# Patient Record
Sex: Male | Born: 1965
Health system: Southern US, Community
[De-identification: ages and names within clinical notes are randomized; demographics above are authoritative.]

## PROBLEM LIST (undated history)

## (undated) DIAGNOSIS — I4891 Unspecified atrial fibrillation: Secondary | ICD-10-CM

## (undated) DIAGNOSIS — I2510 Atherosclerotic heart disease of native coronary artery without angina pectoris: Secondary | ICD-10-CM

## (undated) DIAGNOSIS — I219 Acute myocardial infarction, unspecified: Secondary | ICD-10-CM

## (undated) DIAGNOSIS — K219 Gastro-esophageal reflux disease without esophagitis: Secondary | ICD-10-CM

## (undated) DIAGNOSIS — K449 Diaphragmatic hernia without obstruction or gangrene: Secondary | ICD-10-CM

## (undated) DIAGNOSIS — M5137 Other intervertebral disc degeneration, lumbosacral region: Secondary | ICD-10-CM

## (undated) DIAGNOSIS — G4710 Hypersomnia, unspecified: Secondary | ICD-10-CM

## (undated) DIAGNOSIS — I10 Essential (primary) hypertension: Secondary | ICD-10-CM

## (undated) DIAGNOSIS — J984 Other disorders of lung: Secondary | ICD-10-CM

## (undated) DIAGNOSIS — E785 Hyperlipidemia, unspecified: Secondary | ICD-10-CM

## (undated) DIAGNOSIS — Z8669 Personal history of other diseases of the nervous system and sense organs: Secondary | ICD-10-CM

## (undated) DIAGNOSIS — T82190A Other mechanical complication of cardiac electrode, initial encounter: Secondary | ICD-10-CM

## (undated) DIAGNOSIS — R55 Syncope and collapse: Secondary | ICD-10-CM

## (undated) DIAGNOSIS — I495 Sick sinus syndrome: Secondary | ICD-10-CM

## (undated) DIAGNOSIS — G4730 Sleep apnea, unspecified: Secondary | ICD-10-CM

## (undated) DIAGNOSIS — R7302 Impaired glucose tolerance (oral): Secondary | ICD-10-CM

## (undated) DIAGNOSIS — Z950 Presence of cardiac pacemaker: Secondary | ICD-10-CM

## (undated) DIAGNOSIS — R6882 Decreased libido: Secondary | ICD-10-CM

## (undated) DIAGNOSIS — M51379 Other intervertebral disc degeneration, lumbosacral region without mention of lumbar back pain or lower extremity pain: Secondary | ICD-10-CM

## (undated) DIAGNOSIS — M519 Unspecified thoracic, thoracolumbar and lumbosacral intervertebral disc disorder: Secondary | ICD-10-CM

## (undated) HISTORY — DX: Sick sinus syndrome: I49.5

## (undated) HISTORY — DX: Presence of cardiac pacemaker: Z95.0

## (undated) HISTORY — DX: Personal history of other diseases of the nervous system and sense organs: Z86.69

## (undated) HISTORY — DX: Other mechanical complication of cardiac electrode, initial encounter: T82.190A

## (undated) HISTORY — DX: Other disorders of lung: J98.4

## (undated) HISTORY — DX: Essential (primary) hypertension: I10

## (undated) HISTORY — DX: Atherosclerotic heart disease of native coronary artery without angina pectoris: I25.10

## (undated) HISTORY — DX: Hypersomnia, unspecified: G47.10

## (undated) HISTORY — DX: Sleep apnea, unspecified: G47.30

## (undated) HISTORY — DX: Unspecified atrial fibrillation: I48.91

## (undated) HISTORY — DX: Diaphragmatic hernia without obstruction or gangrene: K44.9

## (undated) HISTORY — DX: Syncope and collapse: R55

## (undated) HISTORY — DX: Unspecified thoracic, thoracolumbar and lumbosacral intervertebral disc disorder: M51.9

## (undated) HISTORY — DX: Gastro-esophageal reflux disease without esophagitis: K21.9

## (undated) HISTORY — DX: Impaired glucose tolerance (oral): R73.02

## (undated) HISTORY — DX: Other intervertebral disc degeneration, lumbosacral region: M51.37

## (undated) HISTORY — PX: BACK SURGERY: SHX140

## (undated) HISTORY — DX: Hyperlipidemia, unspecified: E78.5

## (undated) HISTORY — PX: LUMBAR LAMINECTOMY: SHX95

## (undated) HISTORY — DX: Decreased libido: R68.82

## (undated) HISTORY — DX: Other intervertebral disc degeneration, lumbosacral region without mention of lumbar back pain or lower extremity pain: M51.379

---

## 2006-12-14 HISTORY — PX: CARDIAC PACEMAKER PLACEMENT: SHX583

## 2007-08-23 ENCOUNTER — Ambulatory Visit: Payer: Self-pay | Admitting: Cardiology

## 2007-08-23 ENCOUNTER — Ambulatory Visit: Payer: Self-pay | Admitting: Internal Medicine

## 2007-08-23 ENCOUNTER — Inpatient Hospital Stay (HOSPITAL_COMMUNITY): Admission: EM | Admit: 2007-08-23 | Discharge: 2007-08-24 | Payer: Self-pay | Admitting: Emergency Medicine

## 2007-08-26 ENCOUNTER — Ambulatory Visit: Payer: Self-pay | Admitting: Cardiology

## 2007-08-30 ENCOUNTER — Ambulatory Visit: Payer: Self-pay | Admitting: Cardiology

## 2007-08-30 ENCOUNTER — Ambulatory Visit: Payer: Self-pay

## 2007-09-07 ENCOUNTER — Ambulatory Visit: Payer: Self-pay

## 2007-09-07 ENCOUNTER — Ambulatory Visit: Payer: Self-pay | Admitting: Cardiology

## 2007-09-07 ENCOUNTER — Ambulatory Visit: Payer: Self-pay | Admitting: Internal Medicine

## 2007-10-12 ENCOUNTER — Encounter: Payer: Self-pay | Admitting: Internal Medicine

## 2007-10-12 ENCOUNTER — Ambulatory Visit: Payer: Self-pay | Admitting: Internal Medicine

## 2007-10-12 DIAGNOSIS — M549 Dorsalgia, unspecified: Secondary | ICD-10-CM | POA: Insufficient documentation

## 2007-10-12 DIAGNOSIS — F32A Depression, unspecified: Secondary | ICD-10-CM | POA: Insufficient documentation

## 2007-10-12 DIAGNOSIS — Z862 Personal history of diseases of the blood and blood-forming organs and certain disorders involving the immune mechanism: Secondary | ICD-10-CM | POA: Insufficient documentation

## 2007-10-12 DIAGNOSIS — M545 Low back pain, unspecified: Secondary | ICD-10-CM | POA: Insufficient documentation

## 2007-10-12 DIAGNOSIS — I10 Essential (primary) hypertension: Secondary | ICD-10-CM | POA: Insufficient documentation

## 2007-10-12 DIAGNOSIS — M5137 Other intervertebral disc degeneration, lumbosacral region: Secondary | ICD-10-CM | POA: Insufficient documentation

## 2007-10-12 DIAGNOSIS — Z8639 Personal history of other endocrine, nutritional and metabolic disease: Secondary | ICD-10-CM

## 2007-10-12 DIAGNOSIS — F329 Major depressive disorder, single episode, unspecified: Secondary | ICD-10-CM | POA: Insufficient documentation

## 2007-10-12 DIAGNOSIS — I4891 Unspecified atrial fibrillation: Secondary | ICD-10-CM | POA: Insufficient documentation

## 2007-10-12 DIAGNOSIS — K219 Gastro-esophageal reflux disease without esophagitis: Secondary | ICD-10-CM | POA: Insufficient documentation

## 2007-10-12 DIAGNOSIS — E785 Hyperlipidemia, unspecified: Secondary | ICD-10-CM | POA: Insufficient documentation

## 2007-11-17 ENCOUNTER — Ambulatory Visit: Payer: Self-pay | Admitting: Internal Medicine

## 2007-11-17 LAB — CONVERTED CEMR LAB
ALT: 40 units/L (ref 0–53)
AST: 27 units/L (ref 0–37)
Albumin: 3.9 g/dL (ref 3.5–5.2)
Alkaline Phosphatase: 44 units/L (ref 39–117)
Basophils Absolute: 0 10*3/uL (ref 0.0–0.1)
Calcium: 9.2 mg/dL (ref 8.4–10.5)
Chloride: 109 meq/L (ref 96–112)
Cholesterol: 191 mg/dL (ref 0–200)
Eosinophils Absolute: 0.3 10*3/uL (ref 0.0–0.6)
Eosinophils Relative: 6.9 % — ABNORMAL HIGH (ref 0.0–5.0)
GFR calc non Af Amer: 70 mL/min
Ketones, ur: NEGATIVE mg/dL
LDL Cholesterol: 145 mg/dL — ABNORMAL HIGH (ref 0–99)
MCV: 92.3 fL (ref 78.0–100.0)
Nitrite: NEGATIVE
Platelets: 210 10*3/uL (ref 150–400)
RBC: 4.96 M/uL (ref 4.22–5.81)
Total CHOL/HDL Ratio: 7.4
Total Protein, Urine: NEGATIVE mg/dL
Urine Glucose: NEGATIVE mg/dL
VLDL: 20 mg/dL (ref 0–40)
WBC: 4.3 10*3/uL — ABNORMAL LOW (ref 4.5–10.5)

## 2007-11-30 ENCOUNTER — Ambulatory Visit: Payer: Self-pay | Admitting: Internal Medicine

## 2008-01-02 ENCOUNTER — Ambulatory Visit: Payer: Self-pay | Admitting: Internal Medicine

## 2008-05-03 ENCOUNTER — Encounter: Payer: Self-pay | Admitting: Internal Medicine

## 2008-05-03 DIAGNOSIS — J984 Other disorders of lung: Secondary | ICD-10-CM | POA: Insufficient documentation

## 2008-05-03 HISTORY — DX: Other disorders of lung: J98.4

## 2008-05-04 ENCOUNTER — Encounter: Payer: Self-pay | Admitting: Internal Medicine

## 2008-05-11 ENCOUNTER — Ambulatory Visit: Payer: Self-pay | Admitting: Internal Medicine

## 2008-05-11 LAB — CONVERTED CEMR LAB: Creatinine, Ser: 1.1 mg/dL (ref 0.4–1.5)

## 2008-05-21 ENCOUNTER — Ambulatory Visit: Payer: Self-pay | Admitting: Internal Medicine

## 2008-05-23 ENCOUNTER — Ambulatory Visit: Payer: Self-pay | Admitting: Cardiology

## 2008-05-29 ENCOUNTER — Telehealth: Payer: Self-pay | Admitting: Internal Medicine

## 2008-06-07 ENCOUNTER — Emergency Department (HOSPITAL_COMMUNITY): Admission: EM | Admit: 2008-06-07 | Discharge: 2008-06-07 | Payer: Self-pay | Admitting: Emergency Medicine

## 2008-06-07 ENCOUNTER — Ambulatory Visit: Payer: Self-pay | Admitting: Cardiology

## 2008-07-10 ENCOUNTER — Ambulatory Visit: Payer: Self-pay | Admitting: Internal Medicine

## 2008-11-26 ENCOUNTER — Ambulatory Visit: Payer: Self-pay | Admitting: Internal Medicine

## 2008-11-27 LAB — CONVERTED CEMR LAB
ALT: 28 units/L (ref 0–53)
AST: 23 units/L (ref 0–37)
Albumin: 3.9 g/dL (ref 3.5–5.2)
Alkaline Phosphatase: 47 units/L (ref 39–117)
BUN: 18 mg/dL (ref 6–23)
Basophils Absolute: 0 10*3/uL (ref 0.0–0.1)
Basophils Relative: 0.9 % (ref 0.0–3.0)
Bilirubin Urine: NEGATIVE
Bilirubin, Direct: 0.1 mg/dL (ref 0.0–0.3)
CO2: 28 meq/L (ref 19–32)
Calcium: 9.4 mg/dL (ref 8.4–10.5)
Chloride: 109 meq/L (ref 96–112)
Cholesterol: 197 mg/dL (ref 0–200)
Creatinine, Ser: 1.1 mg/dL (ref 0.4–1.5)
Eosinophils Absolute: 0.4 10*3/uL (ref 0.0–0.7)
Eosinophils Relative: 7.1 % — ABNORMAL HIGH (ref 0.0–5.0)
GFR calc Af Amer: 94 mL/min
GFR calc non Af Amer: 77 mL/min
Glucose, Bld: 118 mg/dL — ABNORMAL HIGH (ref 70–99)
HCT: 47.1 % (ref 39.0–52.0)
HDL: 30.8 mg/dL — ABNORMAL LOW (ref >39.0)
Hemoglobin, Urine: NEGATIVE
Hemoglobin: 16.7 g/dL (ref 13.0–17.0)
Hgb A1c MFr Bld: 5 % (ref 4.6–6.0)
Ketones, ur: NEGATIVE mg/dL
LDL Cholesterol: 150 mg/dL — ABNORMAL HIGH (ref 0–99)
Leukocytes, UA: NEGATIVE
Lymphocytes Relative: 27.1 % (ref 12.0–46.0)
MCHC: 35.3 g/dL (ref 30.0–36.0)
MCV: 93.4 fL (ref 78.0–100.0)
Monocytes Absolute: 0.3 10*3/uL (ref 0.1–1.0)
Monocytes Relative: 5.9 % (ref 3.0–12.0)
Neutro Abs: 3 10*3/uL (ref 1.4–7.7)
Neutrophils Relative %: 59 % (ref 43.0–77.0)
Nitrite: NEGATIVE
PSA: 0.77 ng/mL (ref 0.10–4.00)
Platelets: 217 10*3/uL (ref 150–400)
Potassium: 4.6 meq/L (ref 3.5–5.1)
RBC: 5.04 M/uL (ref 4.22–5.81)
RDW: 11.6 % (ref 11.5–14.6)
Sodium: 142 meq/L (ref 135–145)
Specific Gravity, Urine: 1.025 (ref 1.000–1.03)
TSH: 0.7 microintl units/mL (ref 0.35–5.50)
Total Bilirubin: 0.9 mg/dL (ref 0.3–1.2)
Total CHOL/HDL Ratio: 6.4
Total Protein, Urine: NEGATIVE mg/dL
Total Protein: 6.8 g/dL (ref 6.0–8.3)
Triglycerides: 83 mg/dL (ref 0–149)
Urine Glucose: NEGATIVE mg/dL
Urobilinogen, UA: 0.2 (ref 0.0–1.0)
VLDL: 17 mg/dL (ref 0–40)
WBC: 5.1 10*3/uL (ref 4.5–10.5)
pH: 5.5 (ref 5.0–8.0)

## 2008-11-30 ENCOUNTER — Ambulatory Visit: Payer: Self-pay | Admitting: Internal Medicine

## 2008-11-30 DIAGNOSIS — G510 Bell's palsy: Secondary | ICD-10-CM | POA: Insufficient documentation

## 2008-12-14 DIAGNOSIS — I219 Acute myocardial infarction, unspecified: Secondary | ICD-10-CM

## 2008-12-14 HISTORY — PX: CORONARY ANGIOPLASTY WITH STENT PLACEMENT: SHX49

## 2008-12-14 HISTORY — DX: Acute myocardial infarction, unspecified: I21.9

## 2008-12-26 ENCOUNTER — Ambulatory Visit: Payer: Self-pay | Admitting: Internal Medicine

## 2009-01-10 ENCOUNTER — Ambulatory Visit: Payer: Self-pay | Admitting: Internal Medicine

## 2009-01-10 DIAGNOSIS — J069 Acute upper respiratory infection, unspecified: Secondary | ICD-10-CM | POA: Insufficient documentation

## 2009-03-29 ENCOUNTER — Encounter (INDEPENDENT_AMBULATORY_CARE_PROVIDER_SITE_OTHER): Payer: Self-pay

## 2009-05-22 ENCOUNTER — Ambulatory Visit: Payer: Self-pay

## 2009-05-22 ENCOUNTER — Encounter: Payer: Self-pay | Admitting: Internal Medicine

## 2009-08-25 ENCOUNTER — Ambulatory Visit: Payer: Self-pay | Admitting: Cardiovascular Disease

## 2009-08-25 ENCOUNTER — Encounter: Payer: Self-pay | Admitting: Cardiovascular Disease

## 2009-08-25 ENCOUNTER — Inpatient Hospital Stay (HOSPITAL_COMMUNITY): Admission: EM | Admit: 2009-08-25 | Discharge: 2009-08-27 | Payer: Self-pay | Admitting: Emergency Medicine

## 2009-09-11 ENCOUNTER — Encounter: Payer: Self-pay | Admitting: Physician Assistant

## 2009-09-11 ENCOUNTER — Ambulatory Visit: Payer: Self-pay | Admitting: Cardiovascular Disease

## 2009-09-11 ENCOUNTER — Encounter (INDEPENDENT_AMBULATORY_CARE_PROVIDER_SITE_OTHER): Payer: Self-pay | Admitting: *Deleted

## 2009-09-11 DIAGNOSIS — I2510 Atherosclerotic heart disease of native coronary artery without angina pectoris: Secondary | ICD-10-CM

## 2009-09-11 DIAGNOSIS — R079 Chest pain, unspecified: Secondary | ICD-10-CM | POA: Insufficient documentation

## 2009-09-11 HISTORY — DX: Atherosclerotic heart disease of native coronary artery without angina pectoris: I25.10

## 2009-10-14 ENCOUNTER — Telehealth (INDEPENDENT_AMBULATORY_CARE_PROVIDER_SITE_OTHER): Payer: Self-pay | Admitting: *Deleted

## 2009-10-15 ENCOUNTER — Telehealth: Payer: Self-pay | Admitting: Internal Medicine

## 2009-10-21 ENCOUNTER — Ambulatory Visit: Payer: Self-pay | Admitting: Internal Medicine

## 2009-10-21 LAB — CONVERTED CEMR LAB
Alkaline Phosphatase: 42 units/L (ref 39–117)
Basophils Relative: 0.9 % (ref 0.0–3.0)
Bilirubin Urine: NEGATIVE
Bilirubin, Direct: 0.2 mg/dL (ref 0.0–0.3)
CO2: 28 meq/L (ref 19–32)
Calcium: 9 mg/dL (ref 8.4–10.5)
Cholesterol: 99 mg/dL (ref 0–200)
Creatinine, Ser: 0.9 mg/dL (ref 0.4–1.5)
Eosinophils Absolute: 0.2 10*3/uL (ref 0.0–0.7)
Eosinophils Relative: 4.8 % (ref 0.0–5.0)
GFR calc non Af Amer: 96.85 mL/min (ref >60)
LDL Cholesterol: 58 mg/dL (ref 0–99)
Lymphocytes Relative: 28 % (ref 12.0–46.0)
MCHC: 35.7 g/dL (ref 30.0–36.0)
Monocytes Relative: 6.2 % (ref 3.0–12.0)
Neutrophils Relative %: 60.1 % (ref 43.0–77.0)
Nitrite: NEGATIVE
RBC: 4.77 M/uL (ref 4.22–5.81)
Sodium: 142 meq/L (ref 135–145)
Total CHOL/HDL Ratio: 4
Total Protein, Urine: NEGATIVE mg/dL
Total Protein: 7 g/dL (ref 6.0–8.3)
VLDL: 13.2 mg/dL (ref 0.0–40.0)
WBC: 4.3 10*3/uL — ABNORMAL LOW (ref 4.5–10.5)
pH: 5 (ref 5.0–8.0)

## 2009-10-25 IMAGING — CT CT CHEST W/ CM
2 of 4 series · 15 of 36 positions shown, 18 images · IV contrast (Omnipaque 300)
Comparison: 08/26/2007

CLINICAL DATA: Pulmonary nodules

CT CHEST WITH CONTRAST
TECHNIQUE: Multidetector CT imaging of the chest was performed
following the standard protocol during bolus administration of
intravenous contrast.
Contrast: 80 ml Hmnipaque-F99

[Series 2: chest_routine 5.0 b40f st · axial · 0.81mm/px · z∈[-350,-70]mm · 12 of 68 slices shown, 15 images]
[im 6/68  mediastinal]
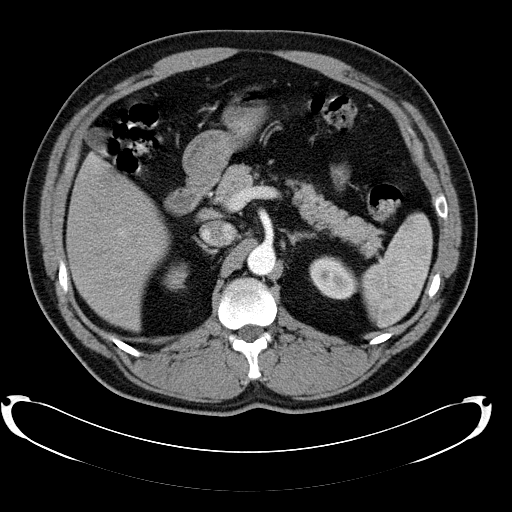
[im 6/68  lung]
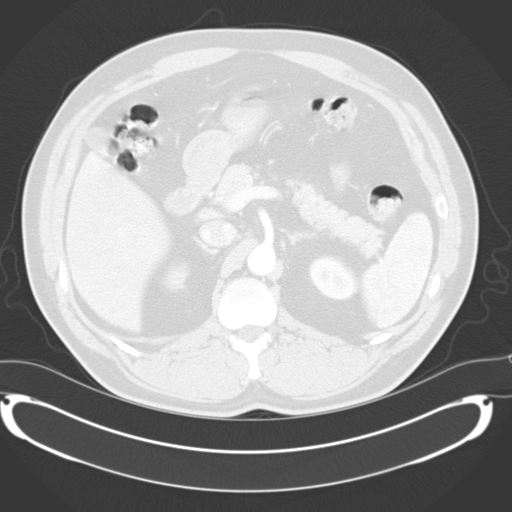
[im 11/68  lung]
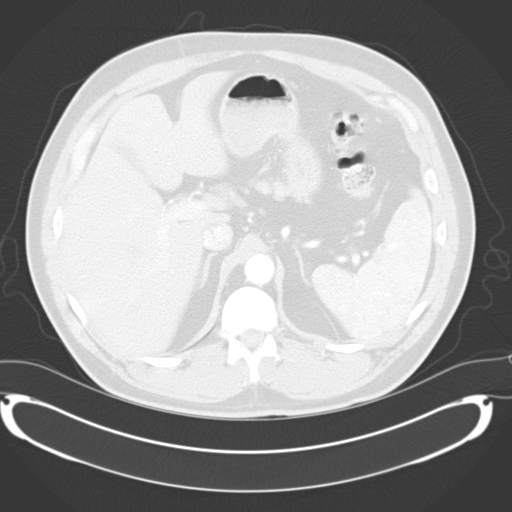
[im 16/68  lung]
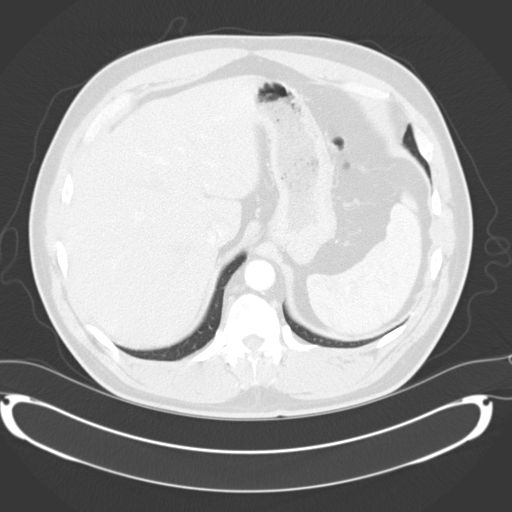
[im 21/68  lung]
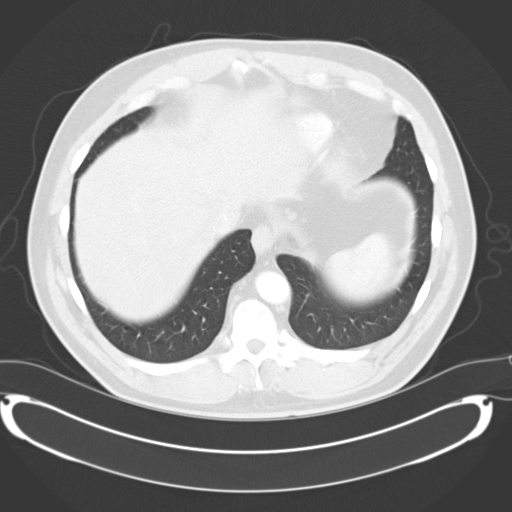
[im 26/68  mediastinal]
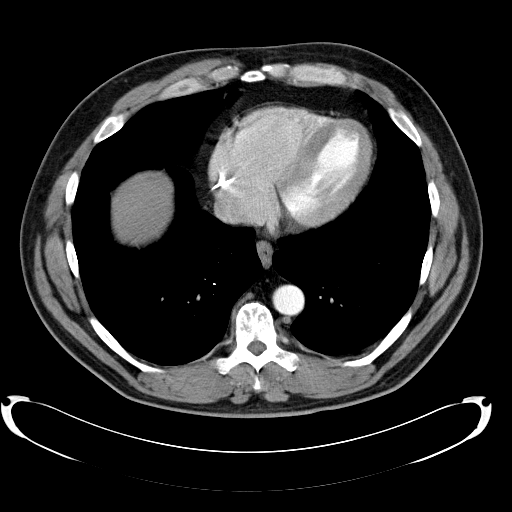
[im 26/68  lung]
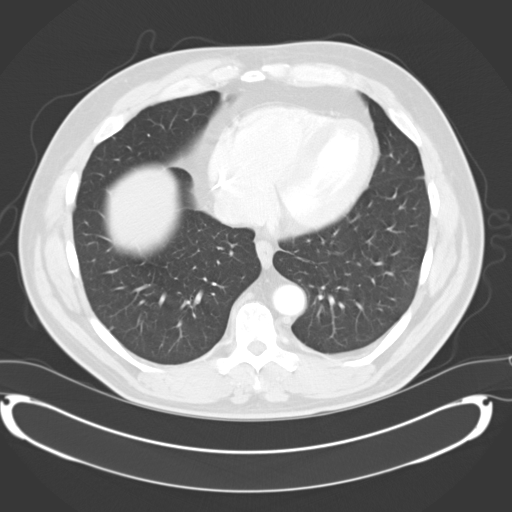
[im 31/68  lung]
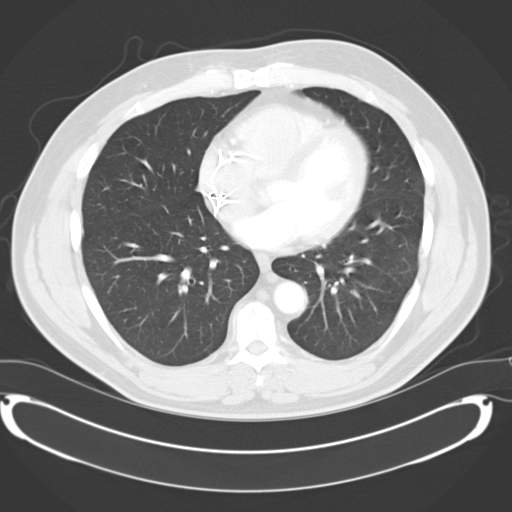
[im 37/68  lung]
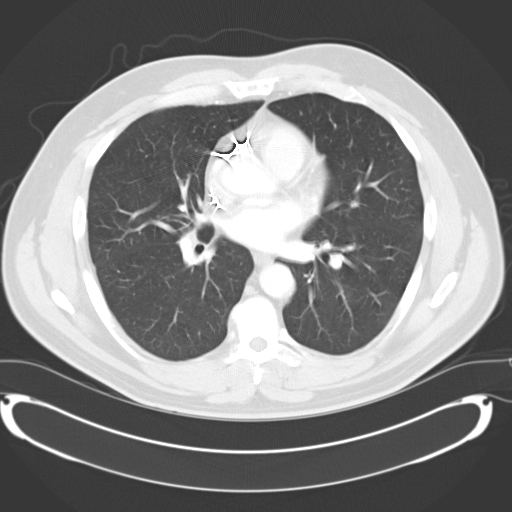
[im 42/68  lung]
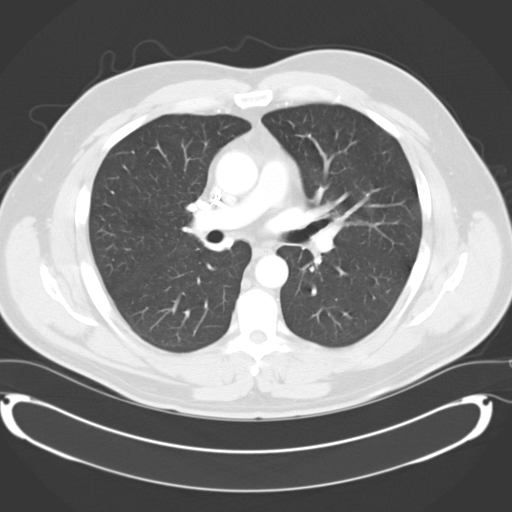
[im 47/68  mediastinal]
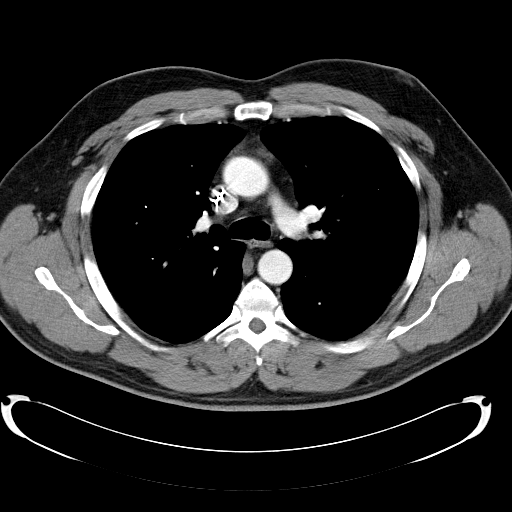
[im 47/68  lung]
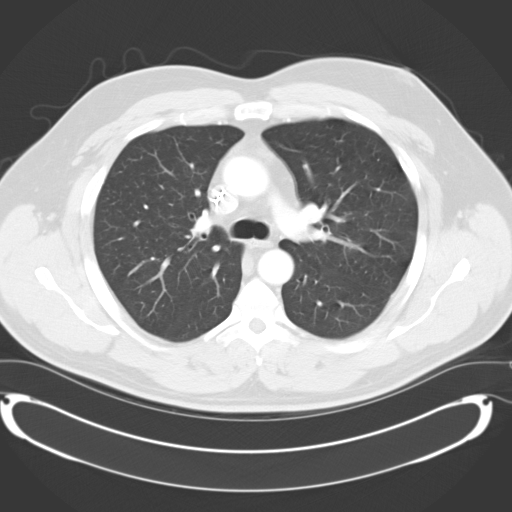
[im 52/68  lung]
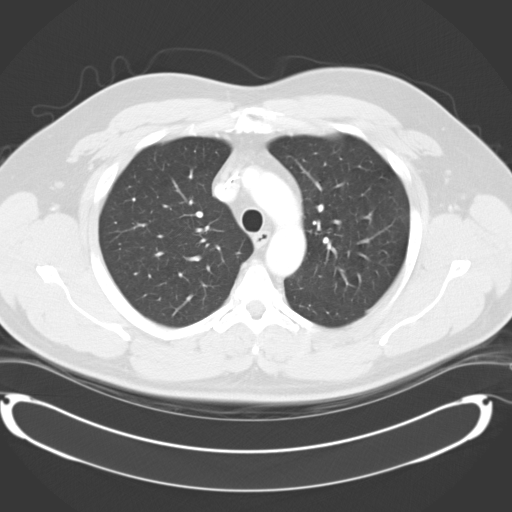
[im 57/68  lung]
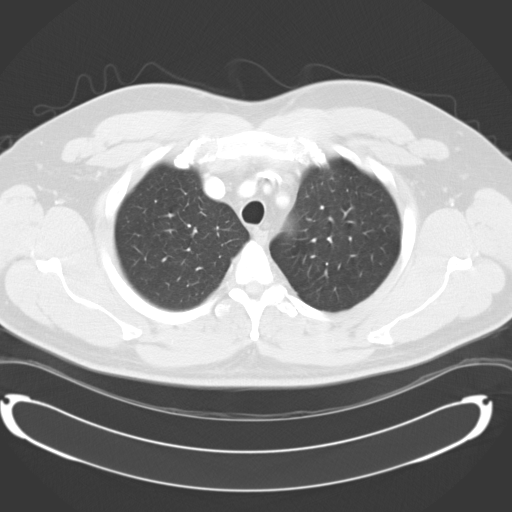
[im 62/68  lung]
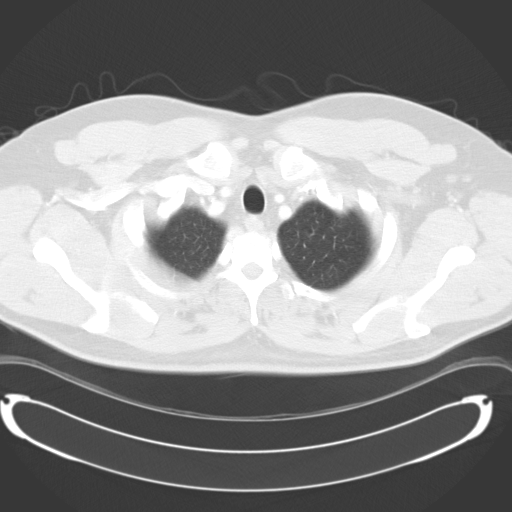

[Series 602: <mpr range> · coronal · 0.81mm/px · 3 of 145 slices shown]
[im 29/145  lung]
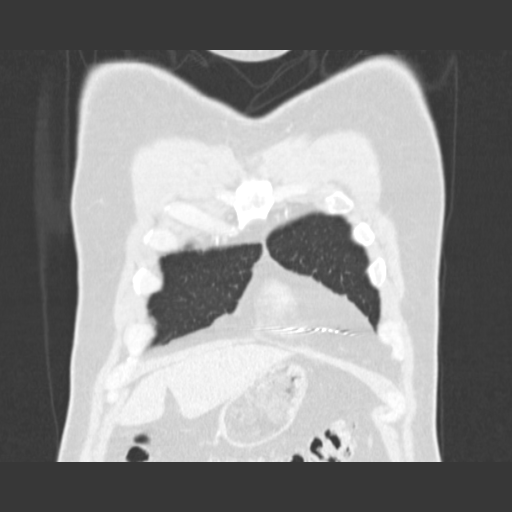
[im 58/145  lung]
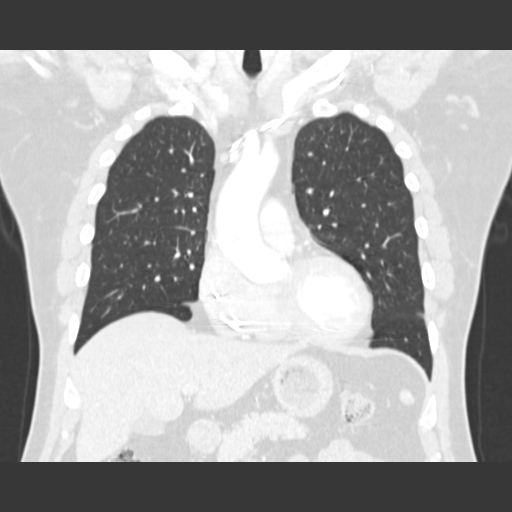
[im 87/145  lung]
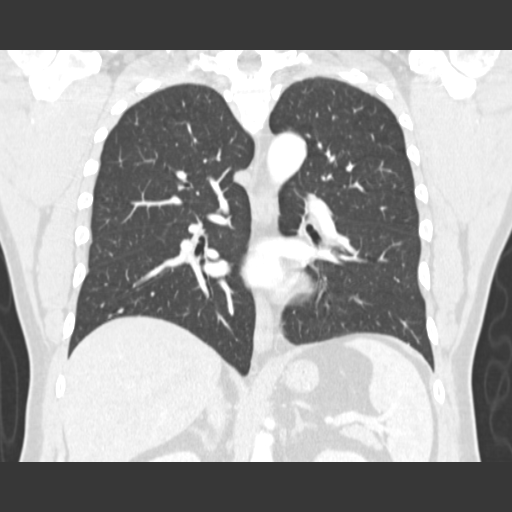

[15 of 36 positions shown; findings below may reference images not displayed]

FINDINGS: The chest wall is unremarkable.  No supraclavicular or
axillary adenopathy.  No significant bony findings.

The heart is normal in size.  No pericardial effusion.  No
mediastinal or hilar adenopathy.  The aorta is normal in caliber.
No dissection.  The esophagus is unremarkable.  There is a small
para esophageal lymph node which is unchanged.

Examination of the lung parenchyma demonstrates stable mild changes
of COPD.  No interval change and small pulmonary nodules.  These
have rather flat margins and are most likely intrapulmonary lymph
nodes.  The lung bases are clear of acute process.  No effusions.

The upper abdomen is unremarkable.
IMPRESSION: 1.  No acute pulmonary findings.  No mediastinal or hilar
adenopathy.
2.  Stable small pulmonary nodules, likely benign intrapulmonary
lymph nodes given their stability over time and morphology.

## 2009-11-15 ENCOUNTER — Encounter: Payer: Self-pay | Admitting: Internal Medicine

## 2009-11-19 ENCOUNTER — Ambulatory Visit: Payer: Self-pay | Admitting: Internal Medicine

## 2009-11-19 DIAGNOSIS — G4710 Hypersomnia, unspecified: Secondary | ICD-10-CM | POA: Insufficient documentation

## 2009-11-19 DIAGNOSIS — G4730 Sleep apnea, unspecified: Secondary | ICD-10-CM

## 2009-11-19 DIAGNOSIS — I495 Sick sinus syndrome: Secondary | ICD-10-CM | POA: Insufficient documentation

## 2009-12-02 ENCOUNTER — Ambulatory Visit: Payer: Self-pay | Admitting: Internal Medicine

## 2009-12-02 DIAGNOSIS — R6882 Decreased libido: Secondary | ICD-10-CM | POA: Insufficient documentation

## 2010-04-24 ENCOUNTER — Telehealth: Payer: Self-pay | Admitting: Internal Medicine

## 2010-04-24 ENCOUNTER — Telehealth: Payer: Self-pay | Admitting: Cardiovascular Disease

## 2010-05-29 ENCOUNTER — Ambulatory Visit: Payer: Self-pay | Admitting: Internal Medicine

## 2010-05-30 ENCOUNTER — Encounter: Payer: Self-pay | Admitting: Internal Medicine

## 2010-06-06 ENCOUNTER — Ambulatory Visit: Payer: Self-pay | Admitting: Internal Medicine

## 2010-06-17 ENCOUNTER — Telehealth: Payer: Self-pay | Admitting: Internal Medicine

## 2010-06-17 LAB — CONVERTED CEMR LAB
ALT: 27 units/L (ref 0–53)
AST: 29 units/L (ref 0–37)
Alkaline Phosphatase: 33 units/L — ABNORMAL LOW (ref 39–117)
Bilirubin, Direct: 0.2 mg/dL (ref 0.0–0.3)
HDL: 31 mg/dL — ABNORMAL LOW (ref >39.00)
Total Bilirubin: 1 mg/dL (ref 0.3–1.2)

## 2010-06-20 ENCOUNTER — Ambulatory Visit: Payer: Self-pay

## 2010-06-20 ENCOUNTER — Telehealth: Payer: Self-pay | Admitting: Internal Medicine

## 2010-08-27 ENCOUNTER — Telehealth: Payer: Self-pay | Admitting: Internal Medicine

## 2010-09-25 ENCOUNTER — Telehealth (INDEPENDENT_AMBULATORY_CARE_PROVIDER_SITE_OTHER): Payer: Self-pay | Admitting: *Deleted

## 2010-11-19 ENCOUNTER — Ambulatory Visit: Payer: Self-pay

## 2010-12-02 ENCOUNTER — Ambulatory Visit: Payer: Self-pay | Admitting: Internal Medicine

## 2010-12-02 LAB — CONVERTED CEMR LAB
ALT: 29 units/L (ref 0–53)
AST: 29 units/L (ref 0–37)
Basophils Relative: 0.4 % (ref 0.0–3.0)
Bilirubin Urine: NEGATIVE
Bilirubin, Direct: 0.2 mg/dL (ref 0.0–0.3)
Chloride: 107 meq/L (ref 96–112)
Eosinophils Relative: 4.5 % (ref 0.0–5.0)
HCT: 43 % (ref 39.0–52.0)
Ketones, ur: NEGATIVE mg/dL
LDL Cholesterol: 60 mg/dL (ref 0–99)
Leukocytes, UA: NEGATIVE
Lymphs Abs: 1.3 10*3/uL (ref 0.7–4.0)
MCV: 93.9 fL (ref 78.0–100.0)
Monocytes Absolute: 0.4 10*3/uL (ref 0.1–1.0)
Monocytes Relative: 9.2 % (ref 3.0–12.0)
Neutrophils Relative %: 53.3 % (ref 43.0–77.0)
PSA: 0.58 ng/mL (ref 0.10–4.00)
Potassium: 4.3 meq/L (ref 3.5–5.1)
RBC: 4.57 M/uL (ref 4.22–5.81)
Sodium: 141 meq/L (ref 135–145)
Total CHOL/HDL Ratio: 4
Total Protein: 6.5 g/dL (ref 6.0–8.3)
Urine Glucose: NEGATIVE mg/dL
WBC: 4.1 10*3/uL — ABNORMAL LOW (ref 4.5–10.5)
pH: 7 (ref 5.0–8.0)

## 2010-12-09 ENCOUNTER — Ambulatory Visit: Payer: Self-pay | Admitting: Internal Medicine

## 2010-12-09 ENCOUNTER — Encounter: Payer: Self-pay | Admitting: Internal Medicine

## 2010-12-09 DIAGNOSIS — R109 Unspecified abdominal pain: Secondary | ICD-10-CM | POA: Insufficient documentation

## 2010-12-14 DIAGNOSIS — Z950 Presence of cardiac pacemaker: Secondary | ICD-10-CM

## 2010-12-14 HISTORY — DX: Presence of cardiac pacemaker: Z95.0

## 2011-01-11 LAB — CONVERTED CEMR LAB
Testosterone Free: 40 pg/mL — ABNORMAL LOW (ref 47.0–244.0)
Testosterone-% Free: 1.7 % (ref 1.6–2.9)
Testosterone: 234.44 ng/dL — ABNORMAL LOW (ref 350–890)

## 2011-01-13 NOTE — Cardiovascular Report (Signed)
Summary: Office Visit   Office Visit   Imported By: Roderic Ovens 05/30/2010 12:44:31  _____________________________________________________________________  External Attachment:    Type:   Image     Comment:   External Document

## 2011-01-13 NOTE — Assessment & Plan Note (Signed)
Summary: appt @ 2:00/pc2/jml      Allergies Added:   Primary Provider:  Dr. Jonny Ruiz   History of Present Illness:   [name] is seen in followup for syncope associated with bradycardia and sinus node dysfunction his paroxysmal atrial fibrillation. He is status post pacemaker implantation.  He also has a history of coronary disease status post drug-eluting stenting to his right coronary artery with near normal left ventricular function. T   he patient denies SOB, chest pain, edema or palpitations His major complaint is fatigue but he has lost 25 lbs(at least) and is exercising more than hour a day.   Current Medications (verified): 1)  Cozaar 25 Mg Tabs (Losartan Potassium) .Marland Kitchen.. 1 By Mouth Once Daily 2)  Ecotrin 325 Mg Tbec (Aspirin) .Marland Kitchen.. 1 By Mouth Once Daily 3)  Plavix 75 Mg Tabs (Clopidogrel Bisulfate) .... Take One Daily 4)  Crestor 40 Mg Tabs (Rosuvastatin Calcium) .... Take One Daily 5)  Metoprolol Tartrate 25 Mg Tabs (Metoprolol Tartrate) .... Take One Tablet Two Times A Day 6)  Nitrostat 0.4 Mg Subl (Nitroglycerin) .... Take One As Needed  Allergies (verified): 1)  ! Lisinopril  Past History:  Past Medical History: Last updated: 11/15/2009 Hyperlipidemia Hypertension GERD Depression Lumbar disc dz Glucose intolerance Atrial fibrillation Pacemaker-St. Jude Zephyr 5826/2008 syncope/asystole episode Hiatal Hernia Low back pain hx Bell's Palsy  Past Surgical History: Last updated: 11/15/2009 s/p pacemaker-St. Jude Zephyr [id number] Lumbar laminectomy  Family History: Last updated: 10/12/2007 Family History of CAD Male 1st degree relative <60 Family History Diabetes 1st degree relative Family History High cholesterol Family History Hypertension  Social History: Last updated: 11/30/2008 Former Smoker Alcohol use-no Married no children work - [employer]  Vital Signs:  Patient profile:   45 year old male Height:      71 inches Weight:      220  pounds Pulse rate:   63 / minute Pulse rhythm:   regular BP sitting:   130 / 82  (left arm) Cuff size:   regular  Vitals Entered By: Judithe Modest CMA (May 29, 2010 2:12 PM)  Physical Exam  General:  The patient was alert and oriented in no acute distress. HEENT Normal.  Neck veins were flat, carotids were brisk.  Lungs were clear.  Heart sounds were regular without murmurs or gallops.  Abdomen was soft with active bowel sounds. There is no clubbing cyanosis or edema. Skin Warm and dry    PPM Specifications Following MD:  Sherryl Manges, MD     PPM Vendor:  St Jude     PPM Model Number:  [id number]     PPM Serial Number:  [id number] DOI:  08/23/2007     PPM Implanting MD:  Sherryl Manges, MD  Lead 1    Location: RA     DOI: 08/23/2007     Model #: [id number]     Serial #: [id number]     Status: active Lead 2    Location: RV     DOI: 08/23/2007     Model #: 1688TC     Serial #: [id number]     Status: active   Indications:  Syncope.PAF   PPM Follow Up Battery Voltage:  2.79 V     Battery Est. Longevity:  8-9.27yrs     Pacer Dependent:  No       PPM Device Measurements Atrium  Amplitude: 2.6 mV, Impedance: 421 ohms, Threshold: 0.50 V at 0.4 msec Right Ventricle  Amplitude: 12.0 mV,  Impedance: 547 ohms, Threshold: 1.0 V at 0.4 msec  Episodes MS Episodes:  56     Coumadin:  No Ventricular High Rate:  0     Atrial Pacing:  80%     Ventricular Pacing:  25%  Parameters Mode:  DDIR     Lower Rate Limit:  60     Upper Rate Limit:  140 Paced AV Delay:  275     Sensed AV Delay:  300 Rate Response Parameters:  Slope-12, Threshold-Auto (-0.5), Reaction time-fast, Recovery time-slow Next Cardiology Appt Due:  11/13/2010 Tech Comments:  56 AF EPISODES--LONGEST WAS 17 MINUTES 20 SECONDS.  NORMAL THRESHOLD TESTING. CHANGED RV AMPLITUDE FROM 1.125 TO 1.250 V. CHANGED SLOPE FROM 12 TO 14 AND MAX SENSOR RATE FROM 160 TO 170bpm.  ROV IN 6 MTHS. Vella Kohler  May 29, 2010 2:30 PM  Impression &  Recommendations:  Problem # 1:  SINOATRIAL NODE DYSFUNCTION (ICD-427.81) the patient has some degree of sinus node dysfunction. We have reprogrammed his rate response to try to improve chronotropic competence. He will let us know how is that he feels His updated medication list for this problem includes:    Ecotrin 325 Mg Tbec (Aspirin) .Marland Kitchen... 1 by mouth once daily    Plavix 75 Mg Tabs (Clopidogrel bisulfate) .Marland Kitchen... Take one daily    Metoprolol Tartrate 25 Mg Tabs (Metoprolol tartrate) .Marland Kitchen... Take one tablet two times a day    Nitrostat 0.4 Mg Subl (Nitroglycerin) .Marland Kitchen... Take one as needed  Orders: EKG w/ Interpretation (93000)  Problem # 2:  HYPERLIPIDEMIA (ICD-272.4) the patient last lipids were in November.His HDL 28 his LDL was 58. These need to be repeated. His updated medication list for this problem includes:    Crestor 40 Mg Tabs (Rosuvastatin calcium) .Marland Kitchen... Take one daily  Problem # 3:  CAD, NATIVE VESSEL (ICD-414.01)  no symptoms of chest pain he'll need be maintained on his current meds His updated medication list for this problem includes:    Ecotrin 325 Mg Tbec (Aspirin) .Marland Kitchen... 1 by mouth once daily    Plavix 75 Mg Tabs (Clopidogrel bisulfate) .Marland Kitchen... Take one daily    Metoprolol Tartrate 25 Mg Tabs (Metoprolol tartrate) .Marland Kitchen... Take one tablet two times a day    Nitrostat 0.4 Mg Subl (Nitroglycerin) .Marland Kitchen... Take one as needed  His updated medication list for this problem includes:    Ecotrin 325 Mg Tbec (Aspirin) .Marland Kitchen... 1 by mouth once daily    Plavix 75 Mg Tabs (Clopidogrel bisulfate) .Marland Kitchen... Take one daily    Metoprolol Tartrate 25 Mg Tabs (Metoprolol tartrate) .Marland Kitchen... Take one tablet two times a day    Nitrostat 0.4 Mg Subl (Nitroglycerin) .Marland Kitchen... Take one as needed  Problem # 4:  CARDIAC PACEMAKER-ST. JUDE ZEPHYR 5826 (ICD-V45.01) Device parameters and data were reviewed and rate response was reprogrammed to increase the slope and the peak heart rate  Patient Instructions: 1)   Your physician recommends that you return for a FASTING lipid profile: this week or next (Lipid/Liver Dx 272.0) 2)  Your physician wants you to follow-up in: 6 months with device clinic and 12 months with Dr Graciela Husbands.  You will receive a reminder letter in the mail two months in advance. If you don't receive a letter, please call our office to schedule the follow-up appointment.

## 2011-01-13 NOTE — Progress Notes (Signed)
Summary: refill   Phone Note Refill Request Message from:  Patient on August 27, 2010 1:25 PM  Refills Requested: Medication #1:  PLAVIX 75 MG TABS take one daily sent to [facility] supply  Initial call taken by: Judie Grieve,  August 27, 2010 1:25 PM    Prescriptions: PLAVIX 75 MG TABS (CLOPIDOGREL BISULFATE) take one daily  #90 x 3   Entered by:   Burnett Kanaris, CNA   Authorized by:   Nathen May, MD, [facility]   Signed by:   Burnett Kanaris, CNA on 08/27/2010   Method used:   Faxed to ...       [facility] (mail-order)             , [place]         Ph: [phone]       Fax: [phone]   RxID:   [id number]

## 2011-01-13 NOTE — Progress Notes (Signed)
Summary: Metoprolol rx  Phone Note Other Incoming   Caller: [facility] [place] Summary of Call: Caller states they have a rx ready for pt to pick-up for Metoprolol tartrate 25mg  Take 1 tablet by mouth two times a day, but they just received rx refill elcetronically for Metoprolol Succinate 25mg  Take 1 tablet by mouth two times a day.  Which medication should pt be taking?  Last filled Metoprolol Tartrate in April, 2011.  Please call pharmacy and advise.  Thanks Initial call taken by: Cloyde Reams RN,  Apr 24, 2010 4:29 PM  Follow-up for Phone Call        Pt to be taking Metoprolol Tartrate 25 mg two times a day.  Pt medication list was keyed in error.  Will fix med list and verify formula with pharmacy. Follow-up by: Judithe Modest CMA,  Apr 24, 2010 4:35 PM  Additional Follow-up for Phone Call Additional follow up Details #1::        Called pharmacy.  Pharmacy aware Additional Follow-up by: Judithe Modest CMA,  Apr 24, 2010 4:38 PM    New/Updated Medications: METOPROLOL TARTRATE 25 MG TABS (METOPROLOL TARTRATE) take one tablet two times a day

## 2011-01-13 NOTE — Procedures (Signed)
Summary: add-on at 11:30/[name]      Allergies Added:   Current Medications (verified): 1)  Cozaar 25 Mg Tabs (Losartan Potassium) .Marland Kitchen.. 1 By Mouth Once Daily 2)  Ecotrin 325 Mg Tbec (Aspirin) .Marland Kitchen.. 1 By Mouth Once Daily 3)  Plavix 75 Mg Tabs (Clopidogrel Bisulfate) .... Take One Daily 4)  Crestor 40 Mg Tabs (Rosuvastatin Calcium) .... Take One Daily 5)  Metoprolol Tartrate 25 Mg Tabs (Metoprolol Tartrate) .... Take One Tablet Two Times A Day 6)  Nitrostat 0.4 Mg Subl (Nitroglycerin) .... Take One As Needed  Allergies (verified): 1)  ! Lisinopril  PPM Specifications Following MD:  Sherryl Manges, MD     PPM Vendor:  St Jude     PPM Model Number:  [id number]     PPM Serial Number:  [id number] PPM DOI:  08/23/2007     PPM Implanting MD:  Sherryl Manges, MD  Lead 1    Location: RA     DOI: 08/23/2007     Model #: 1688TC     Serial #: [id number]     Status: active Lead 2    Location: RV     DOI: 08/23/2007     Model #: 1688TC     Serial #: [id number]     Status: active   Indications:  Syncope.PAF   PPM Follow Up Battery Voltage:  2.79 V     Battery Est. Longevity:  7.25-8.25 yrs     Pacer Dependent:  No       PPM Device Measurements Atrium  Amplitude: 2.6 mV, Impedance: 430 ohms, Threshold: 0.50 V at 0.4 msec Right Ventricle  Amplitude: 12.0 mV, Impedance: 558 ohms, Threshold: 0.875 V at 0.4 msec  Episodes MS Episodes:  68     Coumadin:  No Ventricular High Rate:  0     Atrial Pacing:  82%     Ventricular Pacing:  37%  Parameters Mode:  DDIR     Lower Rate Limit:  60     Upper Rate Limit:  170 Paced AV Delay:  275     Sensed AV Delay:  300 Rate Response Parameters:  Slope-12, Threshold-Auto (-0.5), Reaction time-fast, Recovery time-slow Tech Comments:  PT HAVING THUMPING AROUND PACER--ATRIAL AUTO POLARITY SWITCH OCCURRED.  TESTED RA IMPEDANCE/SENSING/THRESHOLD BIPOLAR--ALL NORMAL TESTING.  CHANGED TO BIPOLAR AND RA OUTPUT TO 2.00 V.  PT COULDNT FEEL THUMPING ANYMORE.  ROV IN [place]. [name]  June 20, 2010 12:31 PM

## 2011-01-13 NOTE — Progress Notes (Signed)
Summary: test result   Phone Note Call from Patient Call back at Home Phone [phone]   Caller: Spouse Reason for Call: Talk to Nurse, Lab or Test Results Summary of Call: o.k to leave message on voice mail. Initial call taken by: Lorne Skeens,  June 17, 2010 3:52 PM  Follow-up for Phone Call        I left a message of results on the pt's VM. Follow-up by: Sherri Rad, RN, BSN,  June 17, 2010 3:58 PM

## 2011-01-13 NOTE — Progress Notes (Signed)
Summary: refill request   Phone Note Refill Request Message from:  Patient on September 25, 2010 4:25 PM  [facility] [address] /plavax 7 days supply to last until mail order gets here   Method Requested: Telephone to Pharmacy Initial call taken by: Glynda Jaeger,  September 25, 2010 4:27 PM  Follow-up for Phone Call        Rx called to pharmacy. Vikki Ports  September 25, 2010 4:37 PM

## 2011-01-13 NOTE — Procedures (Signed)
Summary: pacer check.sjm.amber      Allergies Added:   Current Medications (verified): 1)  Cozaar 25 Mg Tabs (Losartan Potassium) .Marland Kitchen.. 1 By Mouth Once Daily 2)  Ecotrin 325 Mg Tbec (Aspirin) .Marland Kitchen.. 1 By Mouth Once Daily 3)  Plavix 75 Mg Tabs (Clopidogrel Bisulfate) .... Take One Daily 4)  Crestor 40 Mg Tabs (Rosuvastatin Calcium) .... Take One Daily 5)  Metoprolol Tartrate 25 Mg Tabs (Metoprolol Tartrate) .... Take One Tablet Two Times A Day 6)  Nitrostat 0.4 Mg Subl (Nitroglycerin) .... Take One As Needed  Allergies (verified): 1)  ! Lisinopril  PPM Specifications Following MD:  Sherryl Manges, MD     PPM Vendor:  St Jude     PPM Model Number:  [id number]     PPM Serial Number:  [id number] PPM DOI:  08/23/2007     PPM Implanting MD:  Sherryl Manges, MD  Lead 1    Location: RA     DOI: 08/23/2007     Model #: 1688TC     Serial #: [id number]     Status: active Lead 2    Location: RV     DOI: 08/23/2007     Model #: 1688TC     Serial #: [id number]     Status: active   Indications:  Syncope.PAF   PPM Follow Up Remote Check?  No Battery Voltage:  2.78 V     Battery Est. Longevity:  6.75 years     Pacer Dependent:  No       PPM Device Measurements Atrium  Amplitude: 2.4 mV, Impedance: 275 ohms, Threshold: 1.25 V at 0.4 msec Right Ventricle  Amplitude: 12 mV, Impedance: 551 ohms, Threshold: 0.875 V at 0.4 msec  Episodes MS Episodes:  931     Percent Mode Switch:  <1%     Coumadin:  No Atrial Pacing:  84%     Ventricular Pacing:  40%  Parameters Mode:  DDIR     Lower Rate Limit:  60     Upper Rate Limit:  170 Paced AV Delay:  275     Sensed AV Delay:  300 Rate Response Parameters:  Slope-12, Threshold-Auto (-0.5), Reaction time-fast, Recovery time-slow Tech Comments:  RA reporgrammed. 2.25@0 .4.  Initial interrogation showed atrial lead impedance<200, recheck value 275.  The longest A-fib 58 seconds, + Plavix.  ROV 6 months with Dr. Graciela Husbands. Altha Harm, LPN  November 19, 2010 4:20 PM

## 2011-01-13 NOTE — Progress Notes (Signed)
Summary: refill   Phone Note Refill Request Message from:  Patient on Apr 24, 2010 1:40 PM  Refills Requested: Medication #1:  METOPROLOL SUCCINATE 25 MG XR24H-TAB take one two times a day   Supply Requested: 3 months [facility] in [place] 90day supply   Method Requested: Electronic Initial call taken by: Migdalia Dk,  Apr 24, 2010 1:41 PM    Prescriptions: METOPROLOL SUCCINATE 25 MG XR24H-TAB (METOPROLOL SUCCINATE) take one two times a day  #180 x 2   Entered by:   Judithe Modest CMA   Authorized by:   Nathen May, MD, [facility]   Signed by:   Judithe Modest CMA on 04/24/2010   Method used:   Electronically to        [facility] (retail)       [address]       [place], [place]  [address]       Ph: [phone]       Fax: [phone]   RxID:   [id number]

## 2011-01-13 NOTE — Progress Notes (Signed)
Summary: appt   Phone Note Outgoing Call Call back at Home Phone [phone]   Caller: Spouse Reason for Call: Talk to Nurse Summary of Call: spoke with on call dr last night and was told to check with [place] this am about being seen in pacer clinic today Initial call taken by: Migdalia Dk,  June 20, 2010 8:11 AM Summary of Call: SPOKE W/PT'S WIFE AND PT IS COMING IN AT 1130 FOR THUMPING. Vella Kohler  June 20, 2010 9:38 AM

## 2011-01-15 NOTE — Assessment & Plan Note (Signed)
Summary: CPX-LB   Vital Signs:  Patient profile:   45 year old male Height:      71 inches Weight:      216.13 pounds BMI:     30.25 O2 Sat:      96 % on Room air Temp:     98.2 degrees F oral Pulse rate:   72 / minute BP sitting:   112 / 70  (left arm) Cuff size:   large  Vitals Entered By: Zella Ball Ewing CMA (AAMA) (December 09, 2010 1:24 PM)  O2 Flow:  Room air  CC: Adult Physical/RE   Primary Care Provider:  Dr. Jonny Ruiz  CC:  Adult Physical/RE.  History of Present Illness: here for wellness; overall doing well;  Pt denies CP, worsening sob, doe, wheezing, orthopnea, pnd, worsening LE edema, palps, dizziness or syncope  Pt denies new neuro symptoms such as headache, facial or extremity weakness  Pt denies polydipsia, polyuria   Overall good compliance with meds, trying to follow low chol  diet, wt stable, little excercise however .  No fever, wt loss, night sweats, loss of appetite or other constitutional symptoms  Overall good compliance with meds, and good tolerability.  Denies worsening depressive symptoms, suicidal ideation, or panic.   Pt states good ability with ADL's, low fall risk, home safety reviewed and adequate, no significant change in hearing or vision, trying to follow lower chol diet. , walks 4 miles on treadmill every day, and 50 situps and 50 pushups in the am every day.  also with pain to left side abd , achy "bruised" feeling, since 4 wks afte seen last visti, mild to mod,  intermittent, worse when not excercising - better with more treadmill;  no GI symtpoms such as constipation; no GU symptoms such as freq, urgency, blood or flank pain but has incresaed pain to twisting at the waist, and does body work for Engineer, maintenance (IT) & Management      Drug Use:  no.    Problems Prior to Update: 1)  Abdominal Pain Other Specified Site  (ICD-789.09) 2)  Libido, Decreased  (ICD-799.81) 3)  Preventive Health Care  (ICD-V70.0) 4)  Hypersomnia,  Associated With Sleep Apnea  (ICD-780.53) 5)  Sinoatrial Node Dysfunction  (ICD-427.81) 6)  Cardiac Pacemaker-st. Jude Zephyr 5826  (ICD-V45.01) 7)  Cad, Native Vessel  (ICD-414.01) 8)  Chest Pain Unspecified  (ICD-786.50) 9)  Uri  (ICD-465.9) 10)  Back Pain  (ICD-724.5) 11)  Bell's Palsy, Right  (ICD-351.0) 12)  Preventive Health Care  (ICD-V70.0) 13)  Encounter For Long-term Use of Other Medications  (ICD-V58.69) 14)  Pulmonary Nodule  (ICD-518.89) 15)  Special Screening Malignant Neoplasm of Prostate  (ICD-V76.44) 16)  Back Pain  (ICD-724.5) 17)  Preventive Health Care  (ICD-V70.0) 18)  Low Back Pain  (ICD-724.2) 19)  Atrial Fibrillation  (ICD-427.31) 20)  Glucose Intolerance, Minimal, Hx of  (ICD-V12.2) 21)  Disc Disease, Lumbar  (ICD-722.52) 22)  Family History Diabetes 1st Degree Relative  (ICD-V18.0) 23)  Family History of Cad Male 1st Degree Relative <60  (ICD-V16.49) 24)  Depression  (ICD-311) 25)  Gerd  (ICD-530.81) 26)  Hypertension  (ICD-401.9) 27)  Hyperlipidemia  (ICD-272.4)  Medications Prior to Update: 1)  Cozaar 25 Mg Tabs (Losartan Potassium) .Marland Kitchen.. 1 By Mouth Once Daily 2)  Ecotrin 325 Mg Tbec (Aspirin) .Marland Kitchen.. 1 By Mouth Once Daily 3)  Plavix 75 Mg Tabs (Clopidogrel Bisulfate) .... Take One Daily 4)  Crestor 40 Mg Tabs (Rosuvastatin Calcium) .Marland KitchenMarland KitchenMarland Kitchen  Take One Daily 5)  Metoprolol Tartrate 25 Mg Tabs (Metoprolol Tartrate) .... Take One Tablet Two Times A Day 6)  Nitrostat 0.4 Mg Subl (Nitroglycerin) .... Take One As Needed  Current Medications (verified): 1)  Cozaar 25 Mg Tabs (Losartan Potassium) .Marland Kitchen.. 1 By Mouth Once Daily 2)  Ecotrin 325 Mg Tbec (Aspirin) .Marland Kitchen.. 1 By Mouth Once Daily 3)  Plavix 75 Mg Tabs (Clopidogrel Bisulfate) .... Take One Daily 4)  Crestor 40 Mg Tabs (Rosuvastatin Calcium) .... Take One Daily 5)  Metoprolol Tartrate 25 Mg Tabs (Metoprolol Tartrate) .... Take One Tablet Two Times A Day 6)  Nitrostat 0.4 Mg Subl (Nitroglycerin) .... Take One As  Needed  Allergies (verified): 1)  ! Lisinopril  Past History:  Past Medical History: Last updated: 11/15/2009 Hyperlipidemia Hypertension GERD Depression Lumbar disc dz Glucose intolerance Atrial fibrillation Pacemaker-St. Jude Zephyr 5826/2008 syncope/asystole episode Hiatal Hernia Low back pain hx Bell's Palsy  Past Surgical History: Last updated: 11/15/2009 s/p pacemaker-St. Jude Zephyr [phone] Lumbar laminectomy  Family History: Last updated: 10/12/2007 Family History of CAD Male 1st degree relative <60 Family History Diabetes 1st degree relative Family History High cholesterol Family History Hypertension  Social History: Last updated: 12/09/2010 Former Smoker Alcohol use-no Married no children work - Curator cars Drug use-no  Risk Factors: Smoking Status: quit (10/12/2007)  Social History: Former Smoker Alcohol use-no Married no children work - Multimedia programmer use-no Drug Use:  no  Review of Systems  The patient denies anorexia, fever, vision loss, decreased hearing, hoarseness, chest pain, syncope, dyspnea on exertion, peripheral edema, prolonged cough, headaches, hemoptysis, abdominal pain, melena, hematochezia, severe indigestion/heartburn, hematuria, muscle weakness, suspicious skin lesions, transient blindness, difficulty walking, depression, unusual weight change, abnormal bleeding, enlarged lymph nodes, and angioedema.         all otherwise negative per pt -    Physical Exam  General:  alert and overweight-appearing  Head:  normocephalic and atraumatic.   Eyes:  vision grossly intact, pupils equal, and pupils round.   Ears:  R ear normal and L ear normal.   Nose:  no external deformity and no nasal discharge.   Mouth:  no gingival abnormalities and pharynx pink and moist.   Neck:  supple and no masses.   Lungs:  normal respiratory effort and normal breath sounds.   Heart:  normal rate and regular rhythm.   Abdomen:  soft, non-tender,  and normal bowel sounds.   Msk:  no joint tenderness and no joint swelling.   Extremities:  no edema, no erythema  Neurologic:  cranial nerves II-XII intact and strength normal in all extremities.   Skin:  color normal and no rashes.   Psych:  not anxious appearing and not depressed appearing.     Impression & Recommendations:  Problem # 1:  Preventive Health Care (ICD-V70.0) Overall doing well, age appropriate education and counseling updated, referral for preventive services and immunizations addressed, dietary counseling and smoking status adressed , most recent labs reviewed, ecg reviewed I have personally reviewed and have noted 1.The patient's medical and social history 2.Their use of alcohol, tobacco or illicit drugs 3.Their current medications and supplements 4. Functional ability including ADL's, fall risk, home safety risk, hearing & visual impairment  5.Diet and physical activities 6.Evidence for depression or mood disorders The patients weight, height, BMI  have been recorded in the chart I have made referrals, counseling and provided education to the patient based review of the above  Orders: EKG w/ Interpretation (93000)  Problem # 2:  ABDOMINAL PAIN OTHER SPECIFIED SITE (ICD-789.09)  His updated medication list for this problem includes:    Ecotrin 325 Mg Tbec (Aspirin) .Marland Kitchen... 1 by mouth once daily left lateral abd - c/w MSK  - ok to follow  Complete Medication List: 1)  Cozaar 25 Mg Tabs (Losartan potassium) .Marland Kitchen.. 1 by mouth once daily 2)  Ecotrin 325 Mg Tbec (Aspirin) .Marland Kitchen.. 1 by mouth once daily 3)  Plavix 75 Mg Tabs (Clopidogrel bisulfate) .... Take one daily 4)  Crestor 40 Mg Tabs (Rosuvastatin calcium) .... Take one daily 5)  Metoprolol Tartrate 25 Mg Tabs (Metoprolol tartrate) .... Take one tablet two times a day 6)  Nitrostat 0.4 Mg Subl (Nitroglycerin) .... Take one as needed  Other Orders: Tdap => 50yrs IM (09811) Pneumococcal Vaccine (91478) Admin 1st  Vaccine (29562) Admin of Any Addtl Vaccine (13086)  Patient Instructions: 1)  you had the tetanus and pneumonia shots today 2)  Continue all previous medications as before this visit  3)  Please schedule a follow-up appointment in 1 year, or sooner if needed   Orders Added: 1)  EKG w/ Interpretation [93000] 2)  Tdap => 2yrs IM [90715] 3)  Pneumococcal Vaccine [90732] 4)  Admin 1st Vaccine [90471] 5)  Admin of Any Addtl Vaccine [90472] 6)  Est. Patient 40-64 years [57846]   Immunizations Administered:  Tetanus Vaccine:    Vaccine Type: Tdap    Site: right deltoid    Mfr: GlaxoSmithKline    Dose: 0.5 ml    Route: IM    Given by: Zella Ball Ewing CMA (AAMA)    Exp. Date: 10/02/2012    Lot #: [id number]    VIS given: 10/31/08 version given December 09, 2010.  Pneumonia Vaccine:    Vaccine Type: Pneumovax    Site: left deltoid    Mfr: Merck    Dose: 0.5 ml    Route: IM    Given by: Zella Ball Ewing CMA (AAMA)    Exp. Date: 04/09/2012    Lot #: [id number]    VIS given: 11/18/09 version given December 09, 2010.   Immunizations Administered:  Tetanus Vaccine:    Vaccine Type: Tdap    Site: right deltoid    Mfr: GlaxoSmithKline    Dose: 0.5 ml    Route: IM    Given by: Zella Ball Ewing CMA (AAMA)    Exp. Date: 10/02/2012    Lot #: [id number]    VIS given: 10/31/08 version given December 09, 2010.  Pneumonia Vaccine:    Vaccine Type: Pneumovax    Site: left deltoid    Mfr: Merck    Dose: 0.5 ml    Route: IM    Given by: Zella Ball Ewing CMA (AAMA)    Exp. Date: 04/09/2012    Lot #: [id number]    VIS given: 11/18/09 version given December 09, 2010.

## 2011-03-20 ENCOUNTER — Other Ambulatory Visit: Payer: Self-pay | Admitting: *Deleted

## 2011-03-20 LAB — LIPID PANEL
Cholesterol: 173 mg/dL (ref 0–200)
HDL: 26 mg/dL — ABNORMAL LOW (ref >39)
LDL Cholesterol: 130 mg/dL — ABNORMAL HIGH (ref 0–99)
Total CHOL/HDL Ratio: 6.7 RATIO
VLDL: 17 mg/dL (ref 0–40)

## 2011-03-20 LAB — COMPREHENSIVE METABOLIC PANEL
ALT: 30 U/L (ref 0–53)
Alkaline Phosphatase: 45 U/L (ref 39–117)
BUN: 18 mg/dL (ref 6–23)
CO2: 23 mEq/L (ref 19–32)
Chloride: 109 mEq/L (ref 96–112)
GFR calc non Af Amer: >60 mL/min (ref >60)
Glucose, Bld: 163 mg/dL — ABNORMAL HIGH (ref 70–99)
Potassium: 4 mEq/L (ref 3.5–5.1)
Sodium: 140 mEq/L (ref 135–145)
Total Bilirubin: 0.8 mg/dL (ref 0.3–1.2)

## 2011-03-20 LAB — CK TOTAL AND CKMB (NOT AT ARMC)
CK, MB: 3 ng/mL (ref 0.3–4.0)
CK, MB: 80.1 ng/mL — ABNORMAL HIGH (ref 0.3–4.0)
CK, MB: 92.3 ng/mL — ABNORMAL HIGH (ref 0.3–4.0)
Relative Index: 14.4 — ABNORMAL HIGH (ref 0.0–2.5)
Total CK: 538 U/L — ABNORMAL HIGH (ref 7–232)
Total CK: 609 U/L — ABNORMAL HIGH (ref 7–232)

## 2011-03-20 LAB — BASIC METABOLIC PANEL
CO2: 23 mEq/L (ref 19–32)
Calcium: 8.6 mg/dL (ref 8.4–10.5)
Chloride: 106 mEq/L (ref 96–112)
Creatinine, Ser: 0.91 mg/dL (ref 0.4–1.5)
Glucose, Bld: 97 mg/dL (ref 70–99)

## 2011-03-20 LAB — PROTIME-INR: Prothrombin Time: 13.9 seconds (ref 11.6–15.2)

## 2011-03-20 LAB — CBC
HCT: 46 % (ref 39.0–52.0)
Hemoglobin: 14.9 g/dL (ref 13.0–17.0)
Hemoglobin: 16.4 g/dL (ref 13.0–17.0)
MCHC: 34.9 g/dL (ref 30.0–36.0)
MCHC: 35.6 g/dL (ref 30.0–36.0)
MCV: 93.6 fL (ref 78.0–100.0)
MCV: 95 fL (ref 78.0–100.0)
RBC: 4.48 MIL/uL (ref 4.22–5.81)
RBC: 4.91 MIL/uL (ref 4.22–5.81)
RDW: 12.3 % (ref 11.5–15.5)
RDW: 12.6 % (ref 11.5–15.5)

## 2011-03-20 LAB — DIFFERENTIAL
Basophils Absolute: 0.1 10*3/uL (ref 0.0–0.1)
Basophils Relative: 1 % (ref 0–1)
Eosinophils Absolute: 0.1 10*3/uL (ref 0.0–0.7)
Monocytes Relative: 5 % (ref 3–12)
Neutro Abs: 5.6 10*3/uL (ref 1.7–7.7)
Neutrophils Relative %: 75 % (ref 43–77)

## 2011-03-20 LAB — POCT I-STAT, CHEM 8
Calcium, Ion: 1.15 mmol/L (ref 1.12–1.32)
Hemoglobin: 14.6 g/dL (ref 13.0–17.0)
Sodium: 137 mEq/L (ref 135–145)
TCO2: 22 mmol/L (ref 0–100)

## 2011-03-20 LAB — MAGNESIUM: Magnesium: 2 mg/dL (ref 1.5–2.5)

## 2011-03-20 LAB — TROPONIN I: Troponin I: 0.03 ng/mL (ref 0.00–0.06)

## 2011-03-20 LAB — TSH: TSH: 0.495 u[IU]/mL (ref 0.350–4.500)

## 2011-03-20 MED ORDER — CLOPIDOGREL BISULFATE 75 MG PO TABS: 75.0000 mg | ORAL_TABLET | Freq: Every day | ORAL | Status: DC

## 2011-03-25 ENCOUNTER — Telehealth: Payer: Self-pay | Admitting: Internal Medicine

## 2011-03-25 MED ORDER — CLOPIDOGREL BISULFATE 75 MG PO TABS: 75.0000 mg | ORAL_TABLET | Freq: Every day | ORAL | Status: DC

## 2011-03-25 NOTE — Telephone Encounter (Signed)
Called into pharmacy

## 2011-04-28 NOTE — Discharge Summary (Signed)
NAMEDEMAURION, [name] NO.:  [id number]   MEDICAL RECORD NO.:  [id number]          PATIENT TYPE:  INP   LOCATION:  2908                         FACILITY:  [facility]   PHYSICIAN:  Bevelyn Buckles. Bensimhon, MDDATE OF BIRTH:  17-Apr-1966   DATE OF ADMISSION:  08/23/2007  DATE OF DISCHARGE:  08/24/2007                               DISCHARGE SUMMARY   This dictation greater than 40 minutes. No known drug allergies.   FINAL DIAGNOSES:  1. Admitted with syncope/asystole.  2. Immediate implant of temporary transvenous pacer in emergency room.  3. Paroxysmal atrial fibrillation and rapid ventricular rates in the      emergency room, discharged September 10 in sinus rhythm.  4. Discharging day one status post implant of St. Jude Zephyr dual-      chamber pacemaker.  5. Intermittent heart block in EP lab.  6. Abnormal [employer] AV conduction with a PR greater than 260      milliseconds.  7. Metabolic syndrome, home on statin and ACE-I inhibitor.  8. Ongoing tobacco habituation with counseling.  9. Depression.   SECONDARY DIAGNOSES:  1. History of chronic back pain status post back surgery.  2. History of Bell's palsy.   PROCEDURE:  1. 2-D echocardiogram September 9 ejection fraction 50%, no left      ventricular wall motion abnormalities.  2. Implant St. Jude Zephyr dual-chamber pacemaker, Dr. Sherryl Manges.      The patient had no post procedure  complications, no hematoma at      the pacer pocket. The pain is fairly well-controlled with oral      analgesia.  Chest x-ray shows that the leads are in appropriate      position.  The device has been interrogated and the values are      within normal limits.   BRIEF HISTORY:  [name] is a 45 year old male. He has no significant  past medical history except for chronic low back pain.  He has ongoing  tobacco habituation.  He was in his usual state of health the evening of  September 9 when he developed seizure-like activity. He  was brought to  the emergency room, he was found to be in atrial fibrillation with rapid  ventricular response.   He subsequently developed multiple prolonged periods of asystole with  recurrent syncope and seizure-like activity.  He denies any related  chest pain or shortness of breath.  He did have occasional palpitations.  He did have an episode of syncope last year. He has no previous history  of thyroid disease, recent bites or rashes. An emergent right IJ  transvenous pacer was placed under fluoroscopic guidance without  complication by Dr. Gala Romney.   HOSPITAL COURSE:  The patient was admitted through the emergency room to  [facility] September 9 with periods of asystole and concomitant  syncope. After emergent transvenous pacemaker was implanted, the patient  was ruled out for acute myocardial infarction by negative troponin I  studies and nondiagnostic electrocardiogram. A 2-D echocardiogram was  obtained that showed an ejection fraction of 60% and no left  ventricular  regional wall motion abnormalities. He then proceeded to the  electrophysiology lab where he underwent implantation of a dual-chamber  St. Jude pacemaker. He had tobacco cessation counseling and was  recommended try Chantix. Postprocedure day #1 he was alert and oriented  x3, his device was interrogated, all values within normal limits.  The  pacer pocket was without hematoma. His total cholesterol was 206. His  HDL was low at 26. He was seen in follow-up by Dr. Graciela Husbands who diagnosed  metabolic syndrome and started him on a statin as well as ACE-I  inhibitor.  He continues on metoprolol for rate control.  The patient  converted to sinus rhythm in the early hours of September 10 and has  remained in sinus rhythm A pacing at 60 beats per minute. The patient  discharges with extensive follow-up. He is on the following medications.  1. Lisinopril 10 mg daily.  This is a new medication.  2. Simvastatin 40 mg  daily at bedtime.  Also a new medication.  3. Coumadin 5 mg tablets, 10 mg September 10 and then 5 mg on      Thursday, September 11 and he will report to the Coumadin Clinic      Friday, September 12.  4. Metoprolol 50 mg tablets 1 tablet in the morning, 1 tablet in the      evening.   He is asked to keep his incision dry for the next 7 days, to sponge  bathe until Tuesday, September 16.  Mobility of the left arm has also  been described to the patient. He follows up at [facility], [address], [place].   1. Computed Tomogram of the chest Friday, September 12 at 11:30 in the      morning to rule out sarcoid. He was asked to eat nothing after 9:30      in the morning of that test.  He also has a Coumadin clinic visit      at 11 o'clock, Friday, September 12.  He will be on Coumadin for 3      weeks.  2. Thallium study Tuesday, September 16 at 8:45. Nothing to eat 4      hours before the study, no caffeine or soda after midnight Monday,      September 15.  3. Pacer clinic Wednesday, September 24 at 10 o'clock. An incision      check and of course he will have a complete metabolic panel to      check liver function studies. He will also see Dr. Graciela Husbands at 10:15      in the morning on September 24.  4. Dr. Graciela Husbands to recheck the pacemaker and re-interrogate it Wednesday,      December 17 at 9:40. He also has followup with Dr. Oliver Barre,      [facility], [facility] across from [place],      Wednesday, October 29 at 1 o'clock. He has been told that he will      have a $50 charge he missed the appointment without calling 24      hours in advance.   LAB STUDIES THIS ADMISSION:  The TSH was 2.072, T4 was 1.08.  HGB A1c is  5.3, troponin I studies 0.03 and then 0.07. Protime on the day of  discharge 13.3, INR is 1.0. We are expecting to maintain INR between 2  and 3 for the next 3 weeks for this gentleman.  Maple Mirza, PA      Bevelyn Buckles. Bensimhon, MD  Electronically Signed    GM/MEDQ  D:  08/24/2007  T:  08/24/2007  Job:  [id number]   cc:   Duke Salvia, MD, [facility]  Corwin Levins, MD

## 2011-04-28 NOTE — Letter (Signed)
September 07, 2007    Corwin Levins, MD  [address]  [place]  [place] [address]   RE:  [name], [name]  MRN:  [id number]  /  DOB:  07/14/1966   Dear Rosanne Ashing,   [name] is going to be coming to see you to establish.  He was in the  hospital recently for atrial fibrillation, and asystolic pauses  associated with syncopal seizures.  He is doing well in that regard.   We undertook an evaluation looking for sarcoid, and that here, today,  has been negative; including a thallium today that my preliminary review  was negative.   He has had no recurrent atrial fibrillation.  He is ready to go back to  work.   I should note that his CT scan that we undertook for a hilar adenopathy  demonstrates some lung nodules, and followup CT scanning was recommended  in six months.  I have told the family this.   His Myoview was also nonischemic.  Ejection fraction was 51%.   We will plan to see him again in three months' time in the device  clinic.    Sincerely,     Duke Salvia, MD, [facility]  Electronically Signed   SCK/MedQ  DD: 09/07/2007  DT: 09/07/2007  Job #: [id number]

## 2011-04-28 NOTE — Assessment & Plan Note (Signed)
[facility]                                 ON-CALL NOTE   NAME:ISLEYDewitt, Judice                          MRN:          [id number]  DATE:08/24/2007                            DOB:          [birth year]    PRIMARY CARDIOLOGIST:  Dr. Sherryl Manges.   Mr. Mccarley was discharged from the hospital today after being admitted  for syncope and getting a pacemaker. He was started on Coumadin for  paroxysmal atrial fibrillation. His wife called saying she was concerned  because according to her discharge instructions she was supposed to give  him 2 Coumadin tablets tonight and was not sure if she could give them  both at once or if she needed to spread them out a bit.   I discussed the situation with her and advised her that if she wished to  spread them out a little bit that would be fine as long as he got both  of them tonight. I advised her that they did mean for him to get 2  Coumadin tablets tonight and start taking 1 tomorrow with the Coumadin  check on Friday. I advised her it was very important to keep all follow  up appointments especially at the Coumadin clinic and she stated she  would do so. Mr. Sebald is having no issues or complaints.      Theodore Demark, PA-C  Electronically Signed      Gerrit Friends. Dietrich Pates, MD, [facility]  Electronically Signed   RB/MedQ  DD: 08/24/2007  DT: 08/25/2007  Job #: [id number]

## 2011-04-28 NOTE — Cardiovascular Report (Signed)
NAMEDAEJON, LICH NO.:  [id number]   MEDICAL RECORD NO.:  [id number]          PATIENT TYPE:  INP   LOCATION:  2908                         FACILITY:  [facility]   PHYSICIAN:  Bevelyn Buckles. Bensimhon, MDDATE OF BIRTH:  May 10, 1966   DATE OF PROCEDURE:  DATE OF DISCHARGE:                            CARDIAC CATHETERIZATION   PROCEDURES PERFORMED:  Emergent right internal jugular transvenous  pacemaker placement.   PATIENT IDENTIFICATION:  Mr. Leard is a 45 year old male with history of  tobacco use and chronic low back pain but no known cardiac disease who  presented with seizure-like activity.  He is found to be in A fib with  rapid ventricular response with periods of asystole leading to syncope  and seizure-like activity.  This happened multiple times in the  emergency room.  He was brought emergently to the CCU for a transvenous  pacer.  The risks and benefits were discussed with the patient's wife  and mother.  Consent was signed and placed on the chart.  The right neck  area was prepped and draped in routine sterile fashion.  And  anesthetized with 1% subcutaneous lidocaine.  A venous sheath was placed  in the right IJ without any difficulty.  Through the sheath transvenous  pacing wire was placed in the RV apex under fluoro guidance without any  difficulty.  Capture was confirmed with a threshold of less than 1.0 mA.  The sheath and pacing wire were secured.   ESTIMATED BLOOD LOSS:  Was minimal.  There are no apparent  complications.  Chest x-ray is currently pending.      Bevelyn Buckles. Bensimhon, MD  Electronically Signed     DRB/MEDQ  D:  08/23/2007  T:  08/23/2007  Job:  [id number]

## 2011-04-28 NOTE — Op Note (Signed)
NAMEWRANGLER, PENNING NO.:  [id number]   MEDICAL RECORD NO.:  [id number]          PATIENT TYPE:  INP   LOCATION:  2908                         FACILITY:  [facility]   PHYSICIAN:  Duke Salvia, MD, FACCDATE OF BIRTH:  1966-04-26   DATE OF PROCEDURE:  08/23/2007  DATE OF DISCHARGE:                               OPERATIVE REPORT   PREOPERATIVE DIAGNOSIS:  Atrial fibrillation with syncopal seizures  secondary to ventricular asystole.   POSTOPERATIVE DIAGNOSIS:  Atrial fibrillation with syncopal seizures  secondary to ventricular asystole.   PROCEDURE:  Dual chamber pacemaker implantation.   Following obtaining informed consent, the patient was brought to the  electrophysiology laboratory and placed on the fluoroscopic table in the  supine position.  After routine prep and drape of the left upper chest,  lidocaine was infiltrated in the prepectoral subclavicular region.  An  incision was made and carried down to a layer of the prepectoral fascia  using electrocautery and sharp dissection. A pocket was formed  similarly.  Hemostasis was obtained.   Thereafter, attention was turned to gaining access to the extrathoracic  left subclavian vein which was accomplished with mild difficulty but  without the aspiration of air or puncture of the artery.  Two separate  venipunctures were accomplished.  Guidewires were placed and retained  and 0 silk suture was placed in a figure-of-eight fashion and allowed to  hang loosely.   Sequentially, 7-French sheaths were placed over which were passed a St.  Jude 1688TC, 58 cm active fixation ventricular lead, serial number  [id number]. This lead was marked with a tie and the atrial lead was a  1688TC, 52 cm length lead, serial [id number].   Under fluoroscopic guidance, they were manipulated into the right  ventricular apex and the right atrial appendage respectively where the  bipolar R wave was 15.3 mV with a pacing impedance of 819  ohms, a  threshold of 1 volt at 0.5 milliseconds, current threshold 1.1 MA.  There is no diaphragmatic pacing at 10 volts and the current of injury  was brisk.   The bipolar fibrillation wave was 2.5 mV with a pacing impedance of 640  ohms and current of injury was brisk.   With these acceptable parameters recorded, the leads were secured to the  prepectoral fascia and attached to a St. Jude Zephyr XL pulse generator,  model 5826, serial number [id number].  Ventricular sensing and then mode  switch were identified.  The leads and the pulse generator were placed  in the pocket and secured to the prepectoral fascia, the pocket having  been copiously irrigated with antibiotic containing saline solution.  The wound was then closed in  three layers in a normal fashion.  The wound was washed and dried and a  Benzoin and Steri-Strip dressing was applied.  Needle counts, sponge  counts and instrument counts were correct at the end of the procedure  according to the staff.  The patient tolerated the procedure without  apparent complication.      Duke Salvia, MD, [facility]  Electronically Signed  SCK/MEDQ  D:  08/23/2007  T:  08/23/2007  Job:  [id number]

## 2011-04-28 NOTE — Assessment & Plan Note (Signed)
[facility]                         ELECTROPHYSIOLOGY OFFICE NOTE   NAME:[name], [name]                        MRN:          [id number]  DATE:07/10/2008                            DOB:          1966-02-22    [name] is seen following pacemaker implantation for bradycardia  associated with syncope.  [name] is 45 years of age.  He has had no  significant problems except he has not tolerated medications that were  started in hospital.  This included beta-blockers and calcium blockers  for atrial fibrillation with a rapid ventricular response.  Beta-  blockers were discontinued.  The ACE inhibitor was also discontinued.  He then developed symptomatic PVCs, was seen in the emergency room, his  beta-blocker was resumed and symptoms of lassitude, erectile  dysfunction, and irritability have recurred.  The patient apparently had  a propensity towards depression in the past that has not been treated  and that is clearly can be worsened by his intercurrent cardiac  condition with thoughts regarding early demise swirling.   His medications include,  1. Benicar 10, down titrated from 20 because of lightheadedness.  2. Atenolol 50.  3. Aspirin 325.   PHYSICAL EXAMINATION:  VITAL SIGNS:  His blood pressure is 116/76 with a  pulse of 59.  LUNGS:  Clear.  HEART:  Sounds were regular.  EXTREMITIES:  Without edema.  NEUROLOGIC:  His mood was flat and his engaging with the conversation  was intermittent.  He kept asking his wife what it was that the 3 of [place]  were talking about.   Interrogation of his pacemaker demonstrated normal function with a P-  wave of 2, impedance of 550, threshold 0.75 at 0.4.  The St. Jude pulse  generator also had an R-wave of 12, impedance of 534, and threshold 0.75  at 0.4.  Battery voltage was 2.76.   IMPRESSION:  1. Sinus node dysfunction.  2. Syncope secondary to sinus node dysfunction.  3. Symptomatic premature ventricular  contractions  4. Status post pacemaker for sinus node dysfunction and syncope.  5. Anxiety and irritability ? depression.   I have suggested [name] consider a counselor and I have given the  name of Fred May.  Caralyn Guile would also be an option.  I have also  suggested that he let us know as to whether he  would be interested in pursuing antidepressant therapy and I will  forward that onto Dr. Oliver Barre.  We will see him again in 1 year's  time.     Duke Salvia, MD, [facility]  Electronically Signed    SCK/MedQ  DD: 07/10/2008  DT: 07/11/2008  Job #: [id number]

## 2011-04-28 NOTE — H&P (Signed)
[name], [name] NO.:  [id number]   MEDICAL RECORD NO.:  [id number]          PATIENT TYPE:  INP   LOCATION:  2908                         FACILITY:  [facility]   PHYSICIAN:  Bevelyn Buckles. Bensimhon, MDDATE OF BIRTH:  December 23, 1965   DATE OF ADMISSION:  08/23/2007  DATE OF DISCHARGE:                              HISTORY & PHYSICAL   REASON FOR ADMISSION:  Asystole.   HISTORY OF PRESENT ILLNESS:  [name] is a 45 year old male without  significant past medical history except for chronic low back pain and  ongoing tobacco use.  He was in his usual state of health this evening  when he had develop seizure-like activity.  He was brought to the  emergency room and found to be in atrial fibrillation with rapid  ventricular response.  He subsequently developed multiple prolonged  periods of asystole with recurrent syncope and seizure-like activity.  He denied any related chest pain or shortness of breath.  He did have  occasional palpitations.  He reports one episode of syncope last year.  He does not have any previous history of thyroid disease, recent bites  or rashes.   He was brought emergently to the CCU and a right IJ transvenous pacer  was placed emergently under fluoroscopic guidance without any  complications.   Baseline EKG shows atrial fibrillation at a rate of 89 with no ST or T  wave changes.  QRS duration was 80 msec.   REVIEW OF SYSTEMS:  Negative except for  HPI and problem list.   PAST MEDICAL HISTORY:  1. Chronic back pain, status post low back surgery.  2. Tobacco use, ongoing.  3. Bell's palsy.   MEDICATIONS:  None.   SOCIAL HISTORY:  He is married.  He lives with his wife.  He does not  have any children.  He smokes 1 to 1-2/ packs per day and has done so  for a long time.  He has no significant alcohol use.  Denies drugs or  supplement use.   FAMILY HISTORY:  Father is in his 33s.  He has had coronary disease with  multiple stents and bypass  surgery.  Mother is also in her 45s, but no  significant heart disease.   PHYSICAL EXAMINATION:  GENERAL:  He is fatigued-appearing and somewhat  groggy, but alert and oriented x3.  VITAL SIGNS:  Blood pressure currently is 130/92 with a mean of 98,  heart rate 115, saturating 100% on 2 L nonrebreather, respirations  unlabored.  HEENT:  Normal.  NECK:  Supple.  No obvious JVD.  He now has a transvenous pacer in his  right IJ.  Carotids are 1+ bilaterally without bruits.  There is no  lymphadenopathy or thyromegaly.  CARDIAC:  Distant heart sounds.  He is regular and tachycardic with no  obvious murmurs.  LUNGS:  Clear with decreased breath sounds throughout.  ABDOMEN:  Soft, nontender, nondistended, no hepatosplenomegaly, no  bruits, no masses.  Good bowel sounds.  EXTREMITIES:  Clammy with no clubbing, cyanosis or edema.  Distal pulses  are 1+.  There is no  rash.  NEUROLOGIC:  He is alert and oriented x3, fatigued.  He moves all four  extremities without difficulty.  Affect is flattened.   LABORATORY DATA AND X-RAY FINDINGS:  White count 6.9, hemoglobin 16.4,  platelets 206.  Sodium 140, potassium 3.2, creatinine 1.2, glucose 136.  CK-MB 1.9, troponin less than 0.05.   ASSESSMENT:  1. Sick sinus syndrome with atrial fibrillation and asystole leading      to recurrent syncope.  2. Tobacco abuse, ongoing.  3. Chronic back pain.   PLAN:  The etiology of [name] conduction disease is currently  unclear to me.  We will check a TSH, electrolytes and 2-D  echocardiogram.  We will have EP see him in the morning for probable  pacemaker.  He will likely need cardiac catheterization to rule out  ischemia as a cause, although I think this is less likely.  We will  monitor him in the ICU overnight.      Bevelyn Buckles. Bensimhon, MD  Electronically Signed     DRB/MEDQ  D:  08/23/2007  T:  08/23/2007  Job:  [id number]

## 2011-04-28 NOTE — Assessment & Plan Note (Signed)
[facility]                         ELECTROPHYSIOLOGY OFFICE NOTE   NAME:[name], [name]                        MRN:          [id number]  DATE:11/30/2007                            DOB:          May 16, 1966    [name] had syncope with atrial standstill.  He is status post  pacemaker implantation, primarily atrially paced.   His major complaints relate to sexual dysfunction, which may be related  to his medications including metoprolol now at 25 b.i.d.  He is also on  lisinopril 10.  His blood pressure is 131/91, his pulse is 63.  His  lungs were clear, heart sounds were regular, the device pocket was well  healed.   Interrogation of his St. Jude pulse generator demonstrates a P wave of  3.7 with impedance of 584, a threshold of 0.5 at 0.4.  The R wave was 12  with impedance of 550, a threshold of 0.65 at 0.5.  Battery voltage was  2.78.  His heart rate excursion was quite flat; 93% of his beats were at  his lower rate limit of 70.   IMPRESSION:  1. Sinus node dysfunction with atrial standstill.  2. Status post pacer for the above.  3. Metabolic syndrome manifested by:      a.     Hypertension.      b.     Dyslipidemia with low HDL, high triglycerides.      c.     Abdominal obesity.  4. Erectile dysfunction.   [name] is doing fine from a bradycardia point of view.  I have  activated rate response and we have reprogrammed his device for  longevity.   I have also encouraged him to pursue weight loss to try to mitigate  against his cardiovascular risks.  Certainly, the dogma about metabolic  syndrome is changing but his risk profile remains high.  He was unable  to tolerate a statin apparently.  Dr. Jonny Ruiz had suggested that he leave  it off.  I think that that is okay for now, but we should be working  very hard on dietary and metabolic aspects of his intake to improve his  HDL status.   We will see him again in 9 months' time.  He will  follow up with Dr.  Jonny Ruiz in the interim.     Duke Salvia, MD, [facility]  Electronically Signed    SCK/MedQ  DD: 11/30/2007  DT: 11/30/2007  Job #: [id number]

## 2011-04-28 NOTE — Assessment & Plan Note (Signed)
[facility]                         ELECTROPHYSIOLOGY OFFICE NOTE   NAME:ISLEYJahquan, Solomon                        MRN:          [id number]  DATE:12/26/2008                            DOB:          1966-06-20    Ms. [name] is seen in followup for pacemaker implantation for symptomatic  bradycardia associated with syncope.  He has actually been doing better.  His mood has been better and he is exercising more.  He now finds that  his heart rates are limited when he tries to run.   Current medications include Cardizem 120 with his atenolol having been  discontinued, Cozaar 25.  Notably, he is not on a beta blocker.   PHYSICAL EXAMINATION:  VITAL SIGNS:  His blood pressure 128/78, his  pulse is 72.  His weight was 240 pounds, which was stable over the last  6 months.  LUNGS:  Clear.  HEART:  Sounds were regular.  Device pocket was well healed.  EXTREMITIES:  Without edema.   Interrogation of his St. Jude pulse generator demonstrates P-wave of 2.9  with impedance of 468, and threshold of 0.5 and 0.4, the R-wave was 12  with impedance of 529, threshold of 0.5.  Battery voltage was 2.81.  He  was atrial paced 94% at a time and indeed he had episodes of atrial  fibrillation lasting up to 24 hours with a rapid ventricular response.   His device was reprogrammed to augment rate responsiveness by increasing  his slope from 10-12, increasing his response time from medium to fast.  I also turned on AF suppression and increased his lower rate from 55-60.   IMPRESSION:  1. Sinus node dysfunction.  2. Syncope associated with bradycardia secondary sinus node      dysfunction.  3. Paroxysmal atrial fibrillation - newly identified with      thromboembolic risk factors notable for hypertension.  4. Anxiety and irritability - improved.   [name] is doing better at this point, and I am thrilled that that is  so.  We discussed the evidence of atrial fibrillation.  In  the absence  of other risk factors, aspirin is a reasonable thromboembolic risk  reduction strategy.   He is to let us know if he has any further symptoms related to  reprogramming.     Duke Salvia, MD, [facility]  Electronically Signed    SCK/MedQ  DD: 12/26/2008  DT: 12/27/2008  Job #: [id number]

## 2011-04-28 NOTE — Consult Note (Signed)
NAMECLOYDE, OREGEL NO.:  [id number]   MEDICAL RECORD NO.:  [id number]          PATIENT TYPE:  EMS   LOCATION:  ED                           FACILITY:  [facility]   PHYSICIAN:  Madolyn Frieze. Jens Som, MD, FACCDATE OF BIRTH:  07-Jun-1966   DATE OF CONSULTATION:  DATE OF DISCHARGE:  06/07/2008                                 CONSULTATION   PRIMARY CARE PHYSICIAN:  Corwin Levins, MD.   PRIMARY CARDIOLOGIST:  Duke Salvia, MD.   CHIEF COMPLAINT:  Weakness, palpitations.   HISTORY OF PRESENT ILLNESS:  Mr. Bejar is a 45 year old male with a  history of paroxysmal atrial fibrillation.  He had initially been on  beta-blocker and a calcium channel blocker, but these were discontinued  secondary to side effects.  After the discontinuation of the calcium  channel blocker, his side effects resolved.  He had several episodes of  brief palpitations, but was not extremely asymptomatic with this.  He  had been started on Benicar for his blood pressure and was tolerating  this well.   Today, at approximately 9:30, he had sudden onset of weakness.  He also  felt a little presyncopal.  He had no chest pain.  He did have  tachypalpitations.  When his symptoms did not resolve, he came to the  emergency room about an hour later.  His heart rate at times was  elevated with a wide complex rhythm, possibly paced.  The St. Jude rep  came in and interrogated pacemaker and found 3 atrial fib episodes, 1  lasting almost 9 hours in February 2009.  The peak atrial rate during  beats was 591 beats per minute.  The pacemaker was reprogrammed to  detect elevated heart rates.  When he goes into modes which his heart  rate should not exceed 150.  The capture and sensing parameters were  within normal limits.  Full details are being sent to Dr. Graciela Husbands for  review.  The emergency room physician asked Cardiology to evaluate him.   Mr. Erdahl never gets chest pain.  He has no history of syncope with his  palpitations.  He is currently in sinus rhythm with occasional PVCs and  bigeminy and tolerating this fairly well, although he does strongly  notice the PVCs.   PAST MEDICAL HISTORY:  1. Status post echocardiogram in September 2008, showing an EF of 60%.  2. History of paroxysmal atrial fibrillation with associated sinus      node dysfunction and atrial standstill, status post St. Jude dual      lead pacemaker in September 2008.  3. Hypertension.  4. Dyslipidemia with elevated total cholesterol and low HDL.  5. Obesity.  6. Remote history of tobacco use.  7. Remote history of Bells palsy.  8. Status post Persantine-thallium in 2008 that was performed to rule      out sarcoid and was normal.  9. Possible history of depression.   PAST SURGICAL HISTORY:  He is status post a permanent pacemaker and  lumbar surgery.   ALLERGIES:  He was intolerant to LISINOPRIL with a  cough and intolerant  to METOPROLOL and CARDIZEM with erectile dysfunction.   CURRENT MEDICATIONS:  Benicar 20 mg daily.   SOCIAL HISTORY:  He lives in [place] with his wife and works in  [employer].  He has a greater than 30-pack-year history of  tobacco use, but quit in September 2008.  He does not abuse alcohol or  drugs.   FAMILY HISTORY:  Both of his parents are alive in their late 94s and his  mother has no history of heart disease, but his father does have a  history of coronary artery disease.  No siblings have a history of  coronary artery disease.   REVIEW OF SYSTEMS:  He is had no fevers, chills, or recent illnesses.  The palpitations are described above.  He has some arthralgias.  He is  to have reflux symptoms, but since the pacemaker was inserted these have  resolved.  Full 14-point review of systems is otherwise negative.   PHYSICAL EXAMINATION:  VITAL SIGNS:  Temperature is 97.9, blood pressure  135/91, pulse 91, respiratory 20, and O2 saturation 96% on 2 liters.  GENERAL:  He is  well-developed, well-nourished white male in no acute  distress.  HEENT:  Normal.  NECK:  There is no lymphadenopathy, thyromegaly, bruit, or JVD noted.  CARDIOVASCULAR:  Heart regular rate and rhythm with an S1 and S2, and a  faint systolic murmur is noted.  Distal pulses are intact in all 4  extremities.  LUNGS:  Clear to auscultation bilaterally.  SKIN:  No rashes or lesions are noted.  ABDOMEN:  Soft and nontender with active bowel sounds.  EXTREMITIES:  There is no cyanosis, clubbing, or edema noted.  MUSCULOSKELETAL:  There is no joint deformity or effusions and no spine  or CVA tenderness.  NEUROLOGIC:  He is alert and oriented.  Cranial nerves II through XII  grossly intact.   Chest x-ray showed pacemaker in appropriate position.  No acute disease.   EKG is atrial pacing rate 87 with PVCs and an occasional fusion beat.   LABORATORY VALUES:  Hemoglobin 15.9, hematocrit 44.8, WBCs 4.4, and  platelets 184.  Sodium 141, potassium 3.7, chloride 108, CO2 of 24, BUN  14, creatinine 1.12, and glucose 129.   IMPRESSION:  Mr. Riebel is a 45 year old male with a past medical history  of paroxysmal atrial fibrillation, status post pacemaker as well as  hypertension and dyslipidemia who presents with palpitations.  The  palpitations were associated with weakness, but he has no previous  history of dyspnea on exertion, orthopnea, paroxysmal nocturnal dyspnea,  chest pain, or syncope.  Today, he developed palpitations and weakness,  and his EKG is without V pacing and there are no ST changes.  Interrogation of the pacemaker shows paroxysmal atrial fibrillation with  rapid ventricular response.  His CHADS score for is 1 for hypertension.  We will add aspirin to his medication regimen  and try atenolol 50 mg a day, watching for side effects.  A CardioNet  monitor will be used and he will follow up with Dr. Graciela Husbands after this.  If his paroxysmal atrial fibrillation persists, flecainide or  other  antiarrhythmic can be considered.      Theodore Demark, PA-C      Madolyn Frieze. Jens Som, MD, [facility]  Electronically Signed    RB/MEDQ  D:  06/07/2008  T:  06/08/2008  Job:  [id number]   cc:   Corwin Levins, MD

## 2011-05-01 NOTE — Procedures (Signed)
[facility]                             NUCLEAR IMAGING STUDY   NAME:ISLEYBeckett, Maden                        MRN:          [id number]  DATE:09/08/2007                            DOB:          [birth year]    REST THALLIUM STUDY:  There has been question of whether this patient could have cardiac  involvement from sarcoidosis.  A rest thallium study was therefore  arranged to see if there were any abnormalities noted.   At rest the patient received 3.5 mCi of thallium 201.  Images were  obtained in the horizontal, vertical and short-axis views from the SPECT  imaging camera.  There is appropriate uptake in all segments.  There are  no areas of marked drop-out of activity noted.  These findings would  argue against significant sarcoid involvement of the myocardium.   IMPRESSION:  Normal rest thallium study.  There is no area of obvious  decreased uptake suggesting scar or ischemia.  There are no areas of  patchy decrease in uptake to suggest abnormalities related to sarcoid of  the heart.     Luis Abed, MD, [facility]  Electronically Signed    JDK/MedQ  DD: 09/08/2007  DT: 09/09/2007  Job #: [id number]

## 2011-05-14 ENCOUNTER — Encounter: Payer: Self-pay | Admitting: *Deleted

## 2011-05-19 ENCOUNTER — Ambulatory Visit (INDEPENDENT_AMBULATORY_CARE_PROVIDER_SITE_OTHER): Payer: BC Managed Care – PPO | Admitting: Internal Medicine

## 2011-05-19 ENCOUNTER — Encounter: Payer: Self-pay | Admitting: Internal Medicine

## 2011-05-19 VITALS — BP 100/88 | Resp 18 | Ht 71.0 in | Wt 210.0 lb

## 2011-05-19 DIAGNOSIS — I495 Sick sinus syndrome: Secondary | ICD-10-CM

## 2011-05-19 DIAGNOSIS — Z950 Presence of cardiac pacemaker: Secondary | ICD-10-CM | POA: Insufficient documentation

## 2011-05-19 DIAGNOSIS — I4891 Unspecified atrial fibrillation: Secondary | ICD-10-CM

## 2011-05-19 MED ORDER — CLOPIDOGREL BISULFATE 75 MG PO TABS: 75.0000 mg | ORAL_TABLET | Freq: Every day | ORAL | Status: DC

## 2011-05-19 NOTE — Assessment & Plan Note (Signed)
,  The patient's device was interrogated.  The information was reviewed. No changes were made in the programming.    

## 2011-05-19 NOTE — Assessment & Plan Note (Signed)
S/p stenting  wiill review with interventrional how long to continue plavix

## 2011-05-19 NOTE — Assessment & Plan Note (Signed)
Paroxysmal--on asa and plavix

## 2011-05-19 NOTE — Patient Instructions (Signed)
Your physician wants you to follow-up in: 6 months  You will receive a reminder letter in the mail two months in advance. If you don't receive a letter, please call our office to schedule the follow-up appointment.  Your physician recommends that you continue on your current medications as directed. Please refer to the Current Medication list given to you today.  

## 2011-05-19 NOTE — Assessment & Plan Note (Signed)
80% asrial pacing with reasonable heart rate excursion

## 2011-05-19 NOTE — Progress Notes (Signed)
  HPI  [name] is a 45 y.o. male  seen in followup for syncope associated with bradycardia and sinus node dysfunction his paroxysmal atrial fibrillation. He is status post pacemaker implantation.  He also has a history of coronary disease status post drug-eluting stenting to his right coronary artery with near normal left ventricular function. T   he patient denies SOB, chest pain, edema or palpitations  He cpntinues to try and lose weight and is down 34 obs  Feels good  Much less fatigue  Past Medical History  Diagnosis Date  . Hyperlipidemia   . Hypertension   . GERD (gastroesophageal reflux disease)   . Depression   . Lumbar disc disease   . Glucose intolerance (impaired glucose tolerance)   . Atrial fibrillation   . Syncope   . Hiatal hernia   . Low back pain   . H/O: Bell's palsy     Past Surgical History  Procedure Date  . Cardiac pacemaker placement     [facility] [phone]  . Lumbar laminectomy     Current Outpatient Prescriptions  Medication Sig Dispense Refill  . aspirin 325 MG EC tablet Take 325 mg by mouth daily.        . clopidogrel (PLAVIX) 75 MG tablet Take 1 tablet (75 mg total) by mouth daily.  90 tablet  3  . losartan (COZAAR) 25 MG tablet Take 25 mg by mouth daily.        . metoprolol tartrate (LOPRESSOR) 25 MG tablet Take 25 mg by mouth 2 (two) times daily.        . nitroGLYCERIN (NITROSTAT) 0.4 MG SL tablet Place 0.4 mg under the tongue every 5 (five) minutes as needed.        . rosuvastatin (CRESTOR) 40 MG tablet Take 40 mg by mouth daily.          Allergies  Allergen Reactions  . Lisinopril     REACTION: cough    Review of Systems negative except from HPI and PMH  Physical Exam Well developed and well nourished in no acute distress HENT normal E scleral and icterus clear Neck Supple JVP flat; carotids brisk and full Clear to ausculation Regular rate and rhythm, no murmurs gallops or rub Soft with active bowel sounds No  clubbing cyanosis and edema Alert and oriented, grossly normal motor and sensory function Skin Warm and Dry    Assessment and  Plan

## 2011-05-26 ENCOUNTER — Other Ambulatory Visit: Payer: Self-pay

## 2011-05-26 MED ORDER — METOPROLOL TARTRATE 25 MG PO TABS: 25.0000 mg | ORAL_TABLET | Freq: Two times a day (BID) | ORAL | Status: DC

## 2011-06-03 ENCOUNTER — Other Ambulatory Visit: Payer: Self-pay | Admitting: *Deleted

## 2011-06-03 MED ORDER — LOSARTAN POTASSIUM 25 MG PO TABS: 25.0000 mg | ORAL_TABLET | Freq: Every day | ORAL | Status: DC

## 2011-06-04 ENCOUNTER — Telehealth: Payer: Self-pay | Admitting: Internal Medicine

## 2011-06-04 MED ORDER — ROSUVASTATIN CALCIUM 40 MG PO TABS: 40.0000 mg | ORAL_TABLET | Freq: Every day | ORAL | Status: DC

## 2011-06-04 NOTE — Telephone Encounter (Signed)
crestor 40 mg. [facility] on [address]- [place].

## 2011-06-04 NOTE — Telephone Encounter (Signed)
RX sent into pharmacy. LMOM. 

## 2011-06-10 ENCOUNTER — Other Ambulatory Visit: Payer: Self-pay | Admitting: *Deleted

## 2011-06-10 MED ORDER — LOSARTAN POTASSIUM 25 MG PO TABS: 25.0000 mg | ORAL_TABLET | Freq: Every day | ORAL | Status: DC

## 2011-09-10 LAB — CBC
HCT: 44.8
Hemoglobin: 15.9
MCHC: 35.5
MCV: 90.6
Platelets: 184
RBC: 4.95
RDW: 12.3
WBC: 4.4

## 2011-09-10 LAB — BASIC METABOLIC PANEL
BUN: 14
CO2: 24
Chloride: 108
Creatinine, Ser: 1.12
Glucose, Bld: 129 — ABNORMAL HIGH
Potassium: 3.7

## 2011-09-10 LAB — DIFFERENTIAL
Basophils Absolute: 0
Basophils Relative: 1
Eosinophils Absolute: 0.2
Eosinophils Relative: 4
Lymphocytes Relative: 33
Lymphs Abs: 1.5
Monocytes Absolute: 0.3
Monocytes Relative: 7
Neutro Abs: 2.4
Neutrophils Relative %: 55

## 2011-09-10 LAB — BASIC METABOLIC PANEL WITH GFR
Calcium: 9.3
GFR calc Af Amer: >60
GFR calc non Af Amer: >60
Sodium: 141

## 2011-09-15 ENCOUNTER — Telehealth: Payer: Self-pay

## 2011-09-15 DIAGNOSIS — Z1289 Encounter for screening for malignant neoplasm of other sites: Secondary | ICD-10-CM

## 2011-09-15 DIAGNOSIS — Z0000 Encounter for general adult medical examination without abnormal findings: Secondary | ICD-10-CM

## 2011-09-15 NOTE — Telephone Encounter (Signed)
Put order in for labs. 

## 2011-09-25 LAB — RAPID URINE DRUG SCREEN, HOSP PERFORMED
Cocaine: NOT DETECTED
Tetrahydrocannabinol: NOT DETECTED

## 2011-09-25 LAB — CARDIAC PANEL(CRET KIN+CKTOT+MB+TROPI)
CK, MB: 4.1 — ABNORMAL HIGH
Relative Index: 2.2
Total CK: 182
Total CK: 190
Troponin I: 0.03

## 2011-09-25 LAB — PROTIME-INR: Prothrombin Time: 13.8

## 2011-09-25 LAB — CBC
HCT: 46.2
Hemoglobin: 16.4
MCHC: 35.2
MCHC: 35.5
MCV: 92.5
Platelets: 196
RDW: 12.5
RDW: 12.6

## 2011-09-25 LAB — DIFFERENTIAL
Basophils Absolute: 0.1
Basophils Relative: 1
Eosinophils Relative: 4
Monocytes Absolute: 0.5
Neutro Abs: 3.7

## 2011-09-25 LAB — POCT CARDIAC MARKERS
CKMB, poc: 1.9
Myoglobin, poc: 111

## 2011-09-25 LAB — LIPID PANEL
Cholesterol: 206 — ABNORMAL HIGH
Total CHOL/HDL Ratio: 7.9
VLDL: 22

## 2011-09-25 LAB — HEMOGLOBIN A1C
Hgb A1c MFr Bld: 5.3
Mean Plasma Glucose: 111

## 2011-09-25 LAB — HEPATIC FUNCTION PANEL
ALT: 30
Bilirubin, Direct: 0.1
Indirect Bilirubin: 0.6
Total Protein: 6

## 2011-09-25 LAB — I-STAT 8, (EC8 V) (CONVERTED LAB)
Acid-base deficit: 1
Operator id: 270651
Potassium: 3.2 — ABNORMAL LOW
TCO2: 22
pCO2, Ven: 29.1 — ABNORMAL LOW
pH, Ven: 7.477 — ABNORMAL HIGH

## 2011-09-25 LAB — COMPREHENSIVE METABOLIC PANEL
AST: 23
Albumin: 3.5
CO2: 25
Calcium: 8.4
Creatinine, Ser: 1.06
GFR calc Af Amer: >60
GFR calc non Af Amer: >60
Total Protein: 6

## 2011-10-20 ENCOUNTER — Encounter: Payer: Self-pay | Admitting: Internal Medicine

## 2011-10-27 ENCOUNTER — Other Ambulatory Visit (INDEPENDENT_AMBULATORY_CARE_PROVIDER_SITE_OTHER): Payer: BC Managed Care – PPO

## 2011-10-27 DIAGNOSIS — Z1289 Encounter for screening for malignant neoplasm of other sites: Secondary | ICD-10-CM

## 2011-10-27 DIAGNOSIS — Z0000 Encounter for general adult medical examination without abnormal findings: Secondary | ICD-10-CM

## 2011-10-27 LAB — URINALYSIS, ROUTINE W REFLEX MICROSCOPIC
Nitrite: NEGATIVE
Specific Gravity, Urine: 1.02 (ref 1.000–1.030)
Total Protein, Urine: NEGATIVE
Urobilinogen, UA: 0.2 (ref 0.0–1.0)

## 2011-10-27 LAB — CBC WITH DIFFERENTIAL/PLATELET
Basophils Absolute: 0 10*3/uL (ref 0.0–0.1)
Eosinophils Absolute: 0.2 10*3/uL (ref 0.0–0.7)
HCT: 45.7 % (ref 39.0–52.0)
Hemoglobin: 15.9 g/dL (ref 13.0–17.0)
Lymphs Abs: 1.3 10*3/uL (ref 0.7–4.0)
MCHC: 34.9 g/dL (ref 30.0–36.0)
MCV: 95.4 fl (ref 78.0–100.0)
Monocytes Absolute: 0.3 10*3/uL (ref 0.1–1.0)
Neutro Abs: 2.8 10*3/uL (ref 1.4–7.7)
Platelets: 198 10*3/uL (ref 150.0–400.0)
RDW: 12.1 % (ref 11.5–14.6)

## 2011-10-27 LAB — PSA: PSA: 0.87 ng/mL (ref 0.10–4.00)

## 2011-10-27 LAB — HEPATIC FUNCTION PANEL
Bilirubin, Direct: 0.2 mg/dL (ref 0.0–0.3)
Total Bilirubin: 1.1 mg/dL (ref 0.3–1.2)
Total Protein: 6.7 g/dL (ref 6.0–8.3)

## 2011-10-27 LAB — LIPID PANEL
LDL Cholesterol: 56 mg/dL (ref 0–99)
VLDL: 14.6 mg/dL (ref 0.0–40.0)

## 2011-10-27 LAB — BASIC METABOLIC PANEL
CO2: 27 mEq/L (ref 19–32)
Calcium: 9.1 mg/dL (ref 8.4–10.5)
Creatinine, Ser: 0.9 mg/dL (ref 0.4–1.5)
GFR: 96 mL/min (ref >60.00)
Sodium: 141 mEq/L (ref 135–145)

## 2011-10-31 ENCOUNTER — Encounter: Payer: Self-pay | Admitting: Internal Medicine

## 2011-10-31 DIAGNOSIS — Z0000 Encounter for general adult medical examination without abnormal findings: Secondary | ICD-10-CM | POA: Insufficient documentation

## 2011-10-31 DIAGNOSIS — R739 Hyperglycemia, unspecified: Secondary | ICD-10-CM | POA: Insufficient documentation

## 2011-11-02 ENCOUNTER — Ambulatory Visit (INDEPENDENT_AMBULATORY_CARE_PROVIDER_SITE_OTHER): Payer: BC Managed Care – PPO | Admitting: Internal Medicine

## 2011-11-02 ENCOUNTER — Encounter: Payer: Self-pay | Admitting: Internal Medicine

## 2011-11-02 VITALS — BP 106/70 | HR 75 | Temp 98.0°F | Ht 71.0 in | Wt 213.4 lb

## 2011-11-02 DIAGNOSIS — R7309 Other abnormal glucose: Secondary | ICD-10-CM

## 2011-11-02 DIAGNOSIS — Z0000 Encounter for general adult medical examination without abnormal findings: Secondary | ICD-10-CM

## 2011-11-02 DIAGNOSIS — R7302 Impaired glucose tolerance (oral): Secondary | ICD-10-CM

## 2011-11-02 NOTE — Progress Notes (Signed)
Subjective:    Patient ID: [name], male    DOB: July 19, 1966, 45 y.o.   MRN: [id number]  HPI  Here for wellness and f/u;  Overall doing ok;  Pt denies CP, worsening SOB, DOE, wheezing, orthopnea, PND, worsening LE edema, palpitations, dizziness or syncope.  Pt denies neurological change such as new Headache, facial or extremity weakness.  Pt denies polydipsia, polyuria, or low sugar symptoms. Pt states overall good compliance with treatment and medications, good tolerability, and trying to follow lower cholesterol diet.  Pt denies worsening depressive symptoms, suicidal ideation or panic. No fever, wt loss, night sweats, loss of appetite, or other constitutional symptoms.  Pt states good ability with ADL's, low fall risk, home safety reviewed and adequate, no significant changes in hearing or vision, and occasionally active with exercise.  Declines flu shot today Past Medical History  Diagnosis Date  . Hyperlipidemia   . Hypertension   . GERD (gastroesophageal reflux disease)   . Depression   . Lumbar disc disease   . Glucose intolerance (impaired glucose tolerance)   . Atrial fibrillation   . Syncope   . Hiatal hernia   . Low back pain   . H/O: Bell's palsy   . ABDOMINAL PAIN OTHER SPECIFIED SITE 12/09/2010  . BACK PAIN 10/12/2007  . BELL'S PALSY, RIGHT 11/30/2008  . CAD, NATIVE VESSEL 09/11/2009  . CHEST PAIN UNSPECIFIED 09/11/2009  . DEPRESSION 10/12/2007  . DISC DISEASE, LUMBAR 10/12/2007  . GERD 10/12/2007  . GLUCOSE INTOLERANCE, MINIMAL, HX OF 10/12/2007  . HYPERLIPIDEMIA 10/12/2007  . HYPERSOMNIA, ASSOCIATED WITH SLEEP APNEA 11/19/2009  . HYPERTENSION 10/12/2007  . LIBIDO, DECREASED 12/02/2009  . LOW BACK PAIN 10/12/2007  . Pacemaker-st judes 05/19/2011  . PULMONARY NODULE 05/03/2008  . Sinoatrial node dysfunction 11/19/2009  . Impaired glucose tolerance 10/31/2011   Past Surgical History  Procedure Date  . Cardiac pacemaker placement     [facility] [phone]  . Lumbar  laminectomy     reports that he has quit smoking. He does not have any smokeless tobacco history on file. He reports that he does not drink alcohol or use illicit drugs. family history includes Coronary artery disease in his other; Diabetes in his other; Hyperlipidemia in his other; and Hypertension in his other. Allergies  Allergen Reactions  . Lisinopril     REACTION: cough   Current Outpatient Prescriptions on File Prior to Visit  Medication Sig Dispense Refill  . aspirin 325 MG EC tablet Take 325 mg by mouth daily.        . clopidogrel (PLAVIX) 75 MG tablet Take 1 tablet (75 mg total) by mouth daily.  90 tablet  3  . losartan (COZAAR) 25 MG tablet Take 1 tablet (25 mg total) by mouth daily.  90 tablet  3  . metoprolol tartrate (LOPRESSOR) 25 MG tablet Take 1 tablet (25 mg total) by mouth 2 (two) times daily.  60 tablet  3  . nitroGLYCERIN (NITROSTAT) 0.4 MG SL tablet Place 0.4 mg under the tongue every 5 (five) minutes as needed.        . rosuvastatin (CRESTOR) 40 MG tablet Take 1 tablet (40 mg total) by mouth daily.  30 tablet  6   Review of Systems Review of Systems  Constitutional: Negative for diaphoresis, activity change, appetite change and unexpected weight change.  HENT: Negative for hearing loss, ear pain, facial swelling, mouth sores and neck stiffness.   Eyes: Negative for pain, redness and visual disturbance.  Respiratory: Negative for shortness of breath and wheezing.   Cardiovascular: Negative for chest pain and palpitations.  Gastrointestinal: Negative for diarrhea, blood in stool, abdominal distention and rectal pain.  Genitourinary: Negative for hematuria, flank pain and decreased urine volume.  Musculoskeletal: Negative for myalgias and joint swelling.  Skin: Negative for color change and wound.  Neurological: Negative for syncope and numbness.  Hematological: Negative for adenopathy.  Psychiatric/Behavioral: Negative for hallucinations, self-injury, decreased  concentration and agitation.      Objective:   Physical Exam BP 106/70  Pulse 75  Temp(Src) 98 F (36.7 C) (Oral)  Ht 5\' 11"  (1.803 m)  Wt 213 lb 6 oz (96.786 kg)  BMI 29.76 kg/m2  SpO2 97% Physical Exam  VS noted Constitutional: Pt is oriented to person, place, and time. Appears well-developed and well-nourished.  HENT:  Head: Normocephalic and atraumatic.  Right Ear: External ear normal.  Left Ear: External ear normal.  Nose: Nose normal.  Mouth/Throat: Oropharynx is clear and moist.  Eyes: Conjunctivae and EOM are normal. Pupils are equal, round, and reactive to light.  Neck: Normal range of motion. Neck supple. No JVD present. No tracheal deviation present.  Cardiovascular: Normal rate, regular rhythm, normal heart sounds and intact distal pulses.   Pulmonary/Chest: Effort normal and breath sounds normal.  Abdominal: Soft. Bowel sounds are normal. There is no tenderness.  Musculoskeletal: Normal range of motion. Exhibits no edema.  Lymphadenopathy:  Has no cervical adenopathy.  Neurological: Pt is alert and oriented to person, place, and time. Pt has normal reflexes. No cranial nerve deficit.  Skin: Skin is warm and dry. No rash noted.  Psychiatric:  Has  normal mood and affect. Behavior is normal.      Assessment & Plan:

## 2011-11-02 NOTE — Patient Instructions (Signed)
Continue all other medications as before You are up to date with prevention today Please return in 1 year for your yearly visit, or sooner if needed, with Lab testing done 3-5 days before

## 2011-11-08 ENCOUNTER — Encounter: Payer: Self-pay | Admitting: Internal Medicine

## 2011-11-08 NOTE — Assessment & Plan Note (Signed)

## 2011-11-13 ENCOUNTER — Other Ambulatory Visit: Payer: Self-pay

## 2011-11-13 MED ORDER — METOPROLOL TARTRATE 25 MG PO TABS: 25.0000 mg | ORAL_TABLET | Freq: Two times a day (BID) | ORAL | Status: DC

## 2011-11-16 ENCOUNTER — Telehealth: Payer: Self-pay | Admitting: Internal Medicine

## 2011-11-16 MED ORDER — METOPROLOL TARTRATE 25 MG PO TABS: 25.0000 mg | ORAL_TABLET | Freq: Two times a day (BID) | ORAL | Status: DC

## 2011-11-16 NOTE — Telephone Encounter (Signed)
Pt needs a refill on metoprolol 25mg  bid called into [facility] off [address] on [address]. Pt needs a 90day supply

## 2011-11-18 ENCOUNTER — Other Ambulatory Visit: Payer: Self-pay | Admitting: *Deleted

## 2011-11-18 MED ORDER — METOPROLOL TARTRATE 25 MG PO TABS: 25.0000 mg | ORAL_TABLET | Freq: Two times a day (BID) | ORAL | Status: DC

## 2011-11-19 ENCOUNTER — Encounter: Payer: Self-pay | Admitting: Internal Medicine

## 2011-11-19 ENCOUNTER — Ambulatory Visit (INDEPENDENT_AMBULATORY_CARE_PROVIDER_SITE_OTHER): Payer: BC Managed Care – PPO | Admitting: *Deleted

## 2011-11-19 ENCOUNTER — Other Ambulatory Visit: Payer: Self-pay | Admitting: Internal Medicine

## 2011-11-19 ENCOUNTER — Encounter: Payer: BC Managed Care – PPO | Admitting: *Deleted

## 2011-11-19 DIAGNOSIS — I498 Other specified cardiac arrhythmias: Secondary | ICD-10-CM

## 2011-11-19 DIAGNOSIS — I495 Sick sinus syndrome: Secondary | ICD-10-CM

## 2011-11-19 LAB — PACEMAKER DEVICE OBSERVATION
AL AMPLITUDE: 2.3 mv
AL THRESHOLD: 0.5 V
BAMS-0001: 190 {beats}/min
DEVICE MODEL PM: 1934644
RV LEAD THRESHOLD: 1 V
VENTRICULAR PACING PM: 32

## 2011-11-19 NOTE — Progress Notes (Signed)
PPM check 

## 2011-12-09 ENCOUNTER — Other Ambulatory Visit: Payer: Self-pay | Admitting: Internal Medicine

## 2011-12-09 DIAGNOSIS — I4891 Unspecified atrial fibrillation: Secondary | ICD-10-CM

## 2011-12-09 NOTE — Telephone Encounter (Signed)
New Msg: pt wife calling stating that pt wants a 90 day supply of RX called in.

## 2011-12-10 MED ORDER — CLOPIDOGREL BISULFATE 75 MG PO TABS: 75.0000 mg | ORAL_TABLET | Freq: Every day | ORAL | Status: DC

## 2011-12-25 ENCOUNTER — Ambulatory Visit (INDEPENDENT_AMBULATORY_CARE_PROVIDER_SITE_OTHER): Payer: BC Managed Care – PPO | Admitting: Internal Medicine

## 2011-12-25 VITALS — BP 148/86 | HR 75 | Temp 98.0°F | Wt 220.0 lb

## 2011-12-25 DIAGNOSIS — I10 Essential (primary) hypertension: Secondary | ICD-10-CM

## 2011-12-25 DIAGNOSIS — W540XXA Bitten by dog, initial encounter: Secondary | ICD-10-CM

## 2011-12-25 DIAGNOSIS — T148XXA Other injury of unspecified body region, initial encounter: Secondary | ICD-10-CM

## 2011-12-25 DIAGNOSIS — L089 Local infection of the skin and subcutaneous tissue, unspecified: Secondary | ICD-10-CM

## 2011-12-25 DIAGNOSIS — T07XXXA Unspecified multiple injuries, initial encounter: Secondary | ICD-10-CM

## 2011-12-25 DIAGNOSIS — R7309 Other abnormal glucose: Secondary | ICD-10-CM

## 2011-12-25 DIAGNOSIS — R7302 Impaired glucose tolerance (oral): Secondary | ICD-10-CM

## 2011-12-25 MED ORDER — AMOXICILLIN-POT CLAVULANATE 875-125 MG PO TABS: 1.0000 | ORAL_TABLET | Freq: Two times a day (BID) | ORAL | Status: AC

## 2011-12-25 NOTE — Patient Instructions (Signed)
Take all new medications as prescribed Continue all other medications as before Please go to ER if the pain, redness, swelling, chill become worse

## 2011-12-26 ENCOUNTER — Encounter: Payer: Self-pay | Admitting: Internal Medicine

## 2011-12-26 NOTE — Assessment & Plan Note (Signed)
stable overall by hx and exam but elev today likely situational, most recent data reviewed with pt, and pt to continue medical treatment as before  BP Readings from Last 3 Encounters:  12/25/11 148/86  11/02/11 106/70  05/19/11 100/88

## 2011-12-26 NOTE — Assessment & Plan Note (Signed)
Mild to mod, for antibx course,  to f/u any worsening symptoms or concerns 

## 2011-12-26 NOTE — Assessment & Plan Note (Signed)
stable overall by hx and exam, most recent data reviewed with pt, and pt to continue medical treatment as before  Lab Results  Component Value Date   HGBA1C 5.0 11/26/2008

## 2011-12-26 NOTE — Progress Notes (Signed)
Subjective:    Patient ID: [name], male    DOB: 01-29-1966, 46 y.o.   MRN: [id number]  HPI  Here with acute onset this am of rapidly worseing red, swelling , tender to left hand at site of dog bite when helping to euthanize a dog with cancer the day before.  Here to f/u; overall doing ok,  Pt denies chest pain, increased sob or doe, wheezing, orthopnea, PND, increased LE swelling, palpitations, dizziness or syncope.  Pt denies new neurological symptoms such as new headache, or facial or extremity weakness or numbness   Pt denies polydipsia, polyuria, or low sugar symptoms such as weakness or confusion improved with po intake.  Pt states overall good compliance with meds, trying to follow lower cholesterol, diabetic diet, wt overall stable but little exercise however. Past Medical History  Diagnosis Date  . Hyperlipidemia   . Hypertension   . GERD (gastroesophageal reflux disease)   . Depression   . Lumbar disc disease   . Glucose intolerance (impaired glucose tolerance)   . Atrial fibrillation   . Syncope   . Hiatal hernia   . Low back pain   . H/O: Bell's palsy   . ABDOMINAL PAIN OTHER SPECIFIED SITE 12/09/2010  . BACK PAIN 10/12/2007  . BELL'S PALSY, RIGHT 11/30/2008  . CAD, NATIVE VESSEL 09/11/2009  . CHEST PAIN UNSPECIFIED 09/11/2009  . DEPRESSION 10/12/2007  . DISC DISEASE, LUMBAR 10/12/2007  . GERD 10/12/2007  . GLUCOSE INTOLERANCE, MINIMAL, HX OF 10/12/2007  . HYPERLIPIDEMIA 10/12/2007  . HYPERSOMNIA, ASSOCIATED WITH SLEEP APNEA 11/19/2009  . HYPERTENSION 10/12/2007  . LIBIDO, DECREASED 12/02/2009  . LOW BACK PAIN 10/12/2007  . Pacemaker-st judes 05/19/2011  . PULMONARY NODULE 05/03/2008  . Sinoatrial node dysfunction 11/19/2009  . Impaired glucose tolerance 10/31/2011   Past Surgical History  Procedure Date  . Cardiac pacemaker placement     [facility] [phone]  . Lumbar laminectomy     reports that he has quit smoking. He does not have any smokeless tobacco history  on file. He reports that he does not drink alcohol or use illicit drugs. family history includes Coronary artery disease in his other; Diabetes in his other; Hyperlipidemia in his other; and Hypertension in his other. Allergies  Allergen Reactions  . Lisinopril     REACTION: cough   Current Outpatient Prescriptions on File Prior to Visit  Medication Sig Dispense Refill  . aspirin 325 MG EC tablet Take 325 mg by mouth daily.        . clopidogrel (PLAVIX) 75 MG tablet Take 1 tablet (75 mg total) by mouth daily.  90 tablet  3  . losartan (COZAAR) 25 MG tablet Take 1 tablet (25 mg total) by mouth daily.  90 tablet  3  . metoprolol tartrate (LOPRESSOR) 25 MG tablet Take 1 tablet (25 mg total) by mouth 2 (two) times daily.  60 tablet  3  . nitroGLYCERIN (NITROSTAT) 0.4 MG SL tablet Place 0.4 mg under the tongue every 5 (five) minutes as needed.        . rosuvastatin (CRESTOR) 40 MG tablet Take 1 tablet (40 mg total) by mouth daily.  30 tablet  6    Review of Systems Review of Systems  Constitutional: Negative for diaphoresis and unexpected weight change.  HENT: Negative for drooling and tinnitus.   Eyes: Negative for photophobia and visual disturbance.  Respiratory: Negative for choking and stridor.   Gastrointestinal: Negative for vomiting and blood in stool.  Genitourinary: Negative for hematuria and decreased urine volume.  Objective:   Physical Exam BP 148/86  Pulse 75  Temp(Src) 98 F (36.7 C) (Oral)  Wt 220 lb (99.791 kg)  SpO2 97% Physical Exam  VS noted Constitutional: Pt appears well-developed and well-nourished.  HENT: Head: Normocephalic.  Right Ear: External ear normal.  Left Ear: External ear normal.  Eyes: Conjunctivae and EOM are normal. Pupils are equal, round, and reactive to light.  Neck: Normal range of motion. Neck supple.  Cardiovascular: Normal rate and regular rhythm.   Pulmonary/Chest: Effort normal and breath sounds normal.  Skin: Skin is warm. No  erythema. left hand with punture wound to post first MCP, with 1-2+ red, sweling, tender with red streak to wrist Psychiatric: Pt behavior is normal. Thought content normal.     Assessment & Plan:

## 2012-01-12 ENCOUNTER — Telehealth: Payer: Self-pay | Admitting: *Deleted

## 2012-01-12 NOTE — Telephone Encounter (Signed)
Left msg on vm husband has had bell palsy in the past. Been having some facial numbness & tingly feeling. Called neurologist here to make appt but was told will need referral from Dr. Jonny Ruiz....01/12/12@4 :37pm/LMB

## 2012-01-12 NOTE — Telephone Encounter (Signed)
Bell's palsy usually involves weakness of part of one side of the face, without pain  It is possible to get bell's palsy twice, but other possibilities would include an allergy reaction, sinus congestion or infection, or even mini-stroke or stroke  If pt has numbness and weakness of one part of the face (without pain), he should go to ER now (often has slurred speech or drooling related to this) to be eval for stroke  Consider ROV if tingling persists without weakness, especially with pain or swelling, fever, HA to see about other causes

## 2012-01-13 NOTE — Telephone Encounter (Signed)
Patients daughter informed and she scheduled OV with JWJ.

## 2012-01-18 ENCOUNTER — Ambulatory Visit: Payer: BC Managed Care – PPO | Admitting: Internal Medicine

## 2012-02-10 ENCOUNTER — Other Ambulatory Visit: Payer: Self-pay | Admitting: *Deleted

## 2012-02-10 MED ORDER — ROSUVASTATIN CALCIUM 40 MG PO TABS: 40.0000 mg | ORAL_TABLET | Freq: Every day | ORAL | Status: DC

## 2012-02-13 ENCOUNTER — Other Ambulatory Visit: Payer: Self-pay | Admitting: *Deleted

## 2012-02-13 MED ORDER — ROSUVASTATIN CALCIUM 40 MG PO TABS: 40.0000 mg | ORAL_TABLET | Freq: Every day | ORAL | Status: DC

## 2012-02-13 NOTE — Telephone Encounter (Signed)
Patient's wife called Saturday clinic for refill of Crestor.  [facility] was [facility] to [facility] but the prefer [facility].  Refill sent.

## 2012-03-08 ENCOUNTER — Other Ambulatory Visit: Payer: Self-pay | Admitting: Internal Medicine

## 2012-03-09 ENCOUNTER — Telehealth: Payer: Self-pay | Admitting: Internal Medicine

## 2012-03-09 NOTE — Telephone Encounter (Signed)
New Problem:     Patient's wife called needing a 90 day refill of her husbands metoprolol tartrate (LOPRESSOR) 25 MG tablet.

## 2012-03-14 MED ORDER — METOPROLOL TARTRATE 25 MG PO TABS: 25.0000 mg | ORAL_TABLET | Freq: Two times a day (BID) | ORAL | Status: DC

## 2012-05-30 ENCOUNTER — Encounter: Payer: Self-pay | Admitting: Internal Medicine

## 2012-05-30 ENCOUNTER — Ambulatory Visit (INDEPENDENT_AMBULATORY_CARE_PROVIDER_SITE_OTHER): Payer: 59 | Admitting: Internal Medicine

## 2012-05-30 VITALS — BP 110/76 | HR 61 | Ht 71.0 in | Wt 214.8 lb

## 2012-05-30 DIAGNOSIS — I495 Sick sinus syndrome: Secondary | ICD-10-CM

## 2012-05-30 DIAGNOSIS — T82190A Other mechanical complication of cardiac electrode, initial encounter: Secondary | ICD-10-CM

## 2012-05-30 DIAGNOSIS — T82198A Other mechanical complication of other cardiac electronic device, initial encounter: Secondary | ICD-10-CM

## 2012-05-30 DIAGNOSIS — I2510 Atherosclerotic heart disease of native coronary artery without angina pectoris: Secondary | ICD-10-CM

## 2012-05-30 DIAGNOSIS — I4891 Unspecified atrial fibrillation: Secondary | ICD-10-CM

## 2012-05-30 DIAGNOSIS — Z950 Presence of cardiac pacemaker: Secondary | ICD-10-CM

## 2012-05-30 HISTORY — DX: Other mechanical complication of cardiac electrode, initial encounter: T82.190A

## 2012-05-30 LAB — PACEMAKER DEVICE OBSERVATION
AL AMPLITUDE: 2.5 mv
AL IMPEDENCE PM: 257 Ohm
BAMS-0001: 190 {beats}/min
BATTERY VOLTAGE: 2.79 V
RV LEAD IMPEDENCE PM: 551 Ohm
VENTRICULAR PACING PM: 36

## 2012-05-30 MED ORDER — NITROGLYCERIN 0.4 MG SL SUBL: 0.4000 mg | SUBLINGUAL_TABLET | SUBLINGUAL | Status: DC | PRN

## 2012-05-30 NOTE — Assessment & Plan Note (Signed)
The patient's device was interrogated.  The information was reviewed. No changes were made in the programming.    

## 2012-05-30 NOTE — Patient Instructions (Signed)
Your physician wants you to follow-up in: 6 months with Kristin/Paula for a device check & 1 year with Dr. Klein. You will receive a reminder letter in the mail two months in advance. If you don't receive a letter, please call our office to schedule the follow-up appointment.  Your physician recommends that you continue on your current medications as directed. Please refer to the Current Medication list given to you today.  

## 2012-05-30 NOTE — Assessment & Plan Note (Signed)
Intermittent low atrial impedances are noted which are increasing in frequency. We will keep an eye on this. I suspect that generator replacement he'll require a new atrial lead. We have reviewed the physiology of this.

## 2012-05-30 NOTE — Progress Notes (Signed)
  HPI  [name] is a 46 y.o. male seen in followup for syncope associated with bradycardia and sinus node dysfunction his paroxysmal atrial fibrillation. He is status post pacemaker implantation.  He also has a history of coronary disease status post drug-eluting stenting to his right coronary artery with near normal left ventricular function.    He denies problems with chest pain or shortness of breath. His exercise tolerance is quite good.  Past Medical History  Diagnosis Date  . Hyperlipidemia   . Hypertension   . GERD (gastroesophageal reflux disease)   . Depression   . Lumbar disc disease   . Glucose intolerance (impaired glucose tolerance)   . Atrial fibrillation   . Syncope   . Hiatal hernia   . Low back pain   . H/O: Bell's palsy   . ABDOMINAL PAIN OTHER SPECIFIED SITE 12/09/2010  . BACK PAIN 10/12/2007  . BELL'S PALSY, RIGHT 11/30/2008  . CAD, NATIVE VESSEL 09/11/2009  . CHEST PAIN UNSPECIFIED 09/11/2009  . DEPRESSION 10/12/2007  . DISC DISEASE, LUMBAR 10/12/2007  . GERD 10/12/2007  . GLUCOSE INTOLERANCE, MINIMAL, HX OF 10/12/2007  . HYPERLIPIDEMIA 10/12/2007  . HYPERSOMNIA, ASSOCIATED WITH SLEEP APNEA 11/19/2009  . HYPERTENSION 10/12/2007  . LIBIDO, DECREASED 12/02/2009  . LOW BACK PAIN 10/12/2007  . Pacemaker-st judes 05/19/2011  . PULMONARY NODULE 05/03/2008  . Sinoatrial node dysfunction 11/19/2009  . Impaired glucose tolerance 10/31/2011    Past Surgical History  Procedure Date  . Cardiac pacemaker placement     [facility] [phone]  . Lumbar laminectomy     Current Outpatient Prescriptions  Medication Sig Dispense Refill  . aspirin 325 MG EC tablet Take 325 mg by mouth daily.        . clopidogrel (PLAVIX) 75 MG tablet Take 1 tablet (75 mg total) by mouth daily.  90 tablet  3  . losartan (COZAAR) 25 MG tablet Take 1 tablet (25 mg total) by mouth daily.  90 tablet  3  . metoprolol tartrate (LOPRESSOR) 25 MG tablet Take 1 tablet (25 mg total) by mouth 2  (two) times daily.  180 tablet  3  . nitroGLYCERIN (NITROSTAT) 0.4 MG SL tablet Place 0.4 mg under the tongue every 5 (five) minutes as needed.        . rosuvastatin (CRESTOR) 40 MG tablet Take 1 tablet (40 mg total) by mouth daily.  30 tablet  6    Allergies  Allergen Reactions  . Lisinopril     REACTION: cough    Review of Systems negative except from HPI and PMH  Physical Exam BP 110/76  Pulse 61  Ht 5\' 11"  (1.803 m)  Wt 214 lb 12.8 oz (97.433 kg)  BMI 29.96 kg/m2 Well developed and well nourished in no acute distress HENT normal E scleral and icterus clear Neck Supple JVP flat; carotids brisk and full Clear to ausculation Regular rate and rhythm, no murmurs gallops or rub Soft with active bowel sounds No clubbing cyanosis none Edema Alert and oriented, grossly normal motor and sensory function Skin Warm and Dry  Electrocardiogram demonstrates atrial pacing at 60 with intervals 14/08/37 Assessment and  Plan

## 2012-06-01 ENCOUNTER — Other Ambulatory Visit: Payer: Self-pay | Admitting: Internal Medicine

## 2012-06-01 MED ORDER — LOSARTAN POTASSIUM 25 MG PO TABS: 25.0000 mg | ORAL_TABLET | Freq: Every day | ORAL | Status: DC

## 2012-07-12 ENCOUNTER — Other Ambulatory Visit: Payer: Self-pay | Admitting: Cardiology

## 2012-07-12 DIAGNOSIS — I4891 Unspecified atrial fibrillation: Secondary | ICD-10-CM

## 2012-07-12 MED ORDER — LOSARTAN POTASSIUM 25 MG PO TABS: 25.0000 mg | ORAL_TABLET | Freq: Every day | ORAL | Status: DC

## 2012-07-12 MED ORDER — CLOPIDOGREL BISULFATE 75 MG PO TABS: 75.0000 mg | ORAL_TABLET | Freq: Every day | ORAL | Status: DC

## 2012-07-15 ENCOUNTER — Other Ambulatory Visit: Payer: Self-pay | Admitting: Cardiology

## 2012-07-15 DIAGNOSIS — I4891 Unspecified atrial fibrillation: Secondary | ICD-10-CM

## 2012-07-15 MED ORDER — LOSARTAN POTASSIUM 25 MG PO TABS: 25.0000 mg | ORAL_TABLET | Freq: Every day | ORAL | Status: DC

## 2012-07-15 MED ORDER — CLOPIDOGREL BISULFATE 75 MG PO TABS: 75.0000 mg | ORAL_TABLET | Freq: Every day | ORAL | Status: DC

## 2012-08-27 ENCOUNTER — Other Ambulatory Visit: Payer: Self-pay | Admitting: Internal Medicine

## 2012-08-29 ENCOUNTER — Other Ambulatory Visit: Payer: Self-pay | Admitting: Internal Medicine

## 2012-08-30 ENCOUNTER — Other Ambulatory Visit: Payer: Self-pay

## 2012-08-30 ENCOUNTER — Telehealth: Payer: Self-pay | Admitting: Internal Medicine

## 2012-08-30 MED ORDER — ROSUVASTATIN CALCIUM 40 MG PO TABS: 40.0000 mg | ORAL_TABLET | Freq: Every day | ORAL | Status: DC

## 2012-08-30 NOTE — Telephone Encounter (Signed)
Pt needs refill [facility] [address]

## 2012-09-01 MED ORDER — METOPROLOL TARTRATE 25 MG PO TABS: 25.0000 mg | ORAL_TABLET | Freq: Two times a day (BID) | ORAL | Status: DC

## 2012-10-31 ENCOUNTER — Other Ambulatory Visit (INDEPENDENT_AMBULATORY_CARE_PROVIDER_SITE_OTHER): Payer: 59

## 2012-10-31 DIAGNOSIS — R7309 Other abnormal glucose: Secondary | ICD-10-CM

## 2012-10-31 DIAGNOSIS — R7302 Impaired glucose tolerance (oral): Secondary | ICD-10-CM

## 2012-10-31 DIAGNOSIS — Z0000 Encounter for general adult medical examination without abnormal findings: Secondary | ICD-10-CM

## 2012-10-31 LAB — CBC WITH DIFFERENTIAL/PLATELET
Basophils Relative: 0.5 % (ref 0.0–3.0)
Eosinophils Absolute: 0.2 10*3/uL (ref 0.0–0.7)
MCHC: 34.4 g/dL (ref 30.0–36.0)
MCV: 94.2 fl (ref 78.0–100.0)
Monocytes Absolute: 0.5 10*3/uL (ref 0.1–1.0)
Neutrophils Relative %: 45 % (ref 43.0–77.0)
Platelets: 194 10*3/uL (ref 150.0–400.0)

## 2012-10-31 LAB — URINALYSIS, ROUTINE W REFLEX MICROSCOPIC
Nitrite: NEGATIVE
Urobilinogen, UA: 0.2 (ref 0.0–1.0)

## 2012-10-31 LAB — HEPATIC FUNCTION PANEL
ALT: 30 U/L (ref 0–53)
Albumin: 4.3 g/dL (ref 3.5–5.2)
Total Bilirubin: 0.8 mg/dL (ref 0.3–1.2)

## 2012-10-31 LAB — BASIC METABOLIC PANEL
GFR: 87.67 mL/min (ref >60.00)
Glucose, Bld: 111 mg/dL — ABNORMAL HIGH (ref 70–99)
Potassium: 4.5 mEq/L (ref 3.5–5.1)
Sodium: 139 mEq/L (ref 135–145)

## 2012-10-31 LAB — LIPID PANEL
LDL Cholesterol: 65 mg/dL (ref 0–99)
Total CHOL/HDL Ratio: 3
VLDL: 15.6 mg/dL (ref 0.0–40.0)

## 2012-10-31 LAB — HEMOGLOBIN A1C: Hgb A1c MFr Bld: 4.9 % (ref 4.6–6.5)

## 2012-11-02 ENCOUNTER — Ambulatory Visit (INDEPENDENT_AMBULATORY_CARE_PROVIDER_SITE_OTHER): Payer: 59 | Admitting: Internal Medicine

## 2012-11-02 ENCOUNTER — Encounter: Payer: Self-pay | Admitting: Internal Medicine

## 2012-11-02 VITALS — BP 100/74 | HR 69 | Temp 97.0°F | Ht 71.0 in | Wt 218.2 lb

## 2012-11-02 DIAGNOSIS — Z0000 Encounter for general adult medical examination without abnormal findings: Secondary | ICD-10-CM

## 2012-11-02 NOTE — Patient Instructions (Addendum)
Continue all other medications as before Please have the pharmacy call with any other refills you may need. Your Blood Work was good today Please return if you change your mind about the flu shot Thank you for enrolling in MyChart. Please follow the instructions below to securely access your online medical record. MyChart allows you to send messages to your doctor, view your test results, renew your prescriptions, schedule appointments, and more. To Log into MyChart, please go to [email or web address], and your Username is:  [email or web address] (same as your password) Please return in 1 year for your yearly visit, or sooner if needed, with Lab testing done 3-5 days before

## 2012-11-02 NOTE — Progress Notes (Signed)
Subjective:    Patient ID: [name], male    DOB: 05-Sep-1966, 46 y.o.   MRN: [id number]  HPI  Here for wellness and f/u;  Overall doing ok;  Pt denies CP, worsening SOB, DOE, wheezing, orthopnea, PND, worsening LE edema, palpitations, dizziness or syncope.  Pt denies neurological change such as new Headache, facial or extremity weakness.  Pt denies polydipsia, polyuria, or low sugar symptoms. Pt states overall good compliance with treatment and medications, good tolerability, and trying to follow lower cholesterol diet.  Pt denies worsening depressive symptoms, suicidal ideation or panic. No fever, wt loss, night sweats, loss of appetite, or other constitutional symptoms.  Pt states good ability with ADL's, low fall risk, home safety reviewed and adequate, no significant changes in hearing or vision, and occasionally active with exercise.  Pt continues to have recurring LBP without change in severity, bowel or bladder change, fever, wt loss,  worsening LE pain/numbness/weakness, gait change or falls; has flare 2 wks ago, now resolved Past Medical History  Diagnosis Date  . Hyperlipidemia   . Hypertension   . GERD (gastroesophageal reflux disease)   . Depression   . Lumbar disc disease   . Glucose intolerance (impaired glucose tolerance)   . Atrial fibrillation   . Syncope   . Hiatal hernia   . Low back pain   . H/O: Bell's palsy   . ABDOMINAL PAIN OTHER SPECIFIED SITE 12/09/2010  . BACK PAIN 10/12/2007  . BELL'S PALSY, RIGHT 11/30/2008  . CAD, NATIVE VESSEL 09/11/2009  . CHEST PAIN UNSPECIFIED 09/11/2009  . DEPRESSION 10/12/2007  . DISC DISEASE, LUMBAR 10/12/2007  . GERD 10/12/2007  . GLUCOSE INTOLERANCE, MINIMAL, HX OF 10/12/2007  . HYPERLIPIDEMIA 10/12/2007  . HYPERSOMNIA, ASSOCIATED WITH SLEEP APNEA 11/19/2009  . HYPERTENSION 10/12/2007  . LIBIDO, DECREASED 12/02/2009  . LOW BACK PAIN 10/12/2007  . Pacemaker-st judes 05/19/2011  . PULMONARY NODULE 05/03/2008  . Sinoatrial node  dysfunction 11/19/2009  . Impaired glucose tolerance 10/31/2011   Past Surgical History  Procedure Date  . Cardiac pacemaker placement     [facility] [phone]  . Lumbar laminectomy     reports that he quit smoking about 5 years ago. He does not have any smokeless tobacco history on file. He reports that he does not drink alcohol or use illicit drugs. family history includes Coronary artery disease in his other; Diabetes in his other; Hyperlipidemia in his other; and Hypertension in his other. Allergies  Allergen Reactions  . Lisinopril     REACTION: cough   Current Outpatient Prescriptions on File Prior to Visit  Medication Sig Dispense Refill  . aspirin 325 MG EC tablet Take 325 mg by mouth daily.        . clopidogrel (PLAVIX) 75 MG tablet Take 1 tablet (75 mg total) by mouth daily.  90 tablet  3  . CRESTOR 40 MG tablet TAKE ONE TABLET BY MOUTH ONE TIME DAILY  30 tablet  5  . losartan (COZAAR) 25 MG tablet Take 1 tablet (25 mg total) by mouth daily.  90 tablet  4  . metoprolol tartrate (LOPRESSOR) 25 MG tablet Take 1 tablet (25 mg total) by mouth 2 (two) times daily.  180 tablet  3  . nitroGLYCERIN (NITROSTAT) 0.4 MG SL tablet Place 1 tablet (0.4 mg total) under the tongue every 5 (five) minutes as needed.  25 tablet  3  . rosuvastatin (CRESTOR) 40 MG tablet Take 1 tablet (40 mg total) by mouth daily.  30 tablet  6    Review of Systems Review of Systems  Constitutional: Negative for diaphoresis, activity change, appetite change and unexpected weight change.  HENT: Negative for hearing loss, ear pain, facial swelling, mouth sores and neck stiffness.   Eyes: Negative for pain, redness and visual disturbance.  Respiratory: Negative for shortness of breath and wheezing.   Cardiovascular: Negative for chest pain and palpitations.  Gastrointestinal: Negative for diarrhea, blood in stool, abdominal distention and rectal pain.  Genitourinary: Negative for hematuria, flank pain and  decreased urine volume.  Musculoskeletal: Negative for myalgias and joint swelling.  Skin: Negative for color change and wound.  Neurological: Negative for syncope and numbness.  Hematological: Negative for adenopathy.  Psychiatric/Behavioral: Negative for hallucinations, self-injury, decreased concentration and agitation.      Objective:   Physical Exam BP 100/74  Pulse 69  Temp 97 F (36.1 C) (Oral)  Ht 5\' 11"  (1.803 m)  Wt 218 lb 4 oz (98.998 kg)  BMI 30.44 kg/m2  SpO2 96% Physical Exam  VS noted Constitutional: Pt is oriented to person, place, and time. Appears well-developed and well-nourished.  HENT:  Head: Normocephalic and atraumatic.  Right Ear: External ear normal.  Left Ear: External ear normal.  Nose: Nose normal.  Mouth/Throat: Oropharynx is clear and moist.  Eyes: Conjunctivae and EOM are normal. Pupils are equal, round, and reactive to light.  Neck: Normal range of motion. Neck supple. No JVD present. No tracheal deviation present.  Cardiovascular: Normal rate, regular rhythm, normal heart sounds and intact distal pulses.   Pulmonary/Chest: Effort normal and breath sounds normal.  Abdominal: Soft. Bowel sounds are normal. There is no tenderness.  Musculoskeletal: Normal range of motion. Exhibits no edema.  Lymphadenopathy:  Has no cervical adenopathy.  Neurological: Pt is alert and oriented to person, place, and time. Pt has normal reflexes. No cranial nerve deficit.  Skin: Skin is warm and dry. No rash noted.  Psychiatric:  Has  normal mood and affect. Behavior is normal.     Assessment & Plan:

## 2012-11-02 NOTE — Assessment & Plan Note (Signed)

## 2012-11-28 ENCOUNTER — Ambulatory Visit (INDEPENDENT_AMBULATORY_CARE_PROVIDER_SITE_OTHER): Payer: 59 | Admitting: *Deleted

## 2012-11-28 DIAGNOSIS — I4891 Unspecified atrial fibrillation: Secondary | ICD-10-CM

## 2012-11-28 DIAGNOSIS — I495 Sick sinus syndrome: Secondary | ICD-10-CM

## 2012-11-28 LAB — PACEMAKER DEVICE OBSERVATION
AL AMPLITUDE: 2.5 mv
AL THRESHOLD: 0.5 V
ATRIAL PACING PM: 78
DEVICE MODEL PM: 1934644
RV LEAD AMPLITUDE: 12 mv
RV LEAD IMPEDENCE PM: 558 Ohm
RV LEAD THRESHOLD: 0.875 V

## 2012-11-28 NOTE — Patient Instructions (Addendum)
Return office visit in 6 months with Dr. Graciela Husbands.

## 2012-11-28 NOTE — Progress Notes (Signed)
PPM check 

## 2012-12-22 ENCOUNTER — Encounter: Payer: Self-pay | Admitting: Internal Medicine

## 2013-03-21 ENCOUNTER — Encounter: Payer: Self-pay | Admitting: Internal Medicine

## 2013-05-26 ENCOUNTER — Encounter: Payer: Self-pay | Admitting: Internal Medicine

## 2013-05-26 ENCOUNTER — Ambulatory Visit (INDEPENDENT_AMBULATORY_CARE_PROVIDER_SITE_OTHER): Payer: 59 | Admitting: Internal Medicine

## 2013-05-26 VITALS — BP 110/80 | HR 62 | Ht 72.0 in | Wt 220.8 lb

## 2013-05-26 DIAGNOSIS — Z5189 Encounter for other specified aftercare: Secondary | ICD-10-CM

## 2013-05-26 DIAGNOSIS — I4891 Unspecified atrial fibrillation: Secondary | ICD-10-CM

## 2013-05-26 DIAGNOSIS — I495 Sick sinus syndrome: Secondary | ICD-10-CM

## 2013-05-26 DIAGNOSIS — Z950 Presence of cardiac pacemaker: Secondary | ICD-10-CM

## 2013-05-26 DIAGNOSIS — I2510 Atherosclerotic heart disease of native coronary artery without angina pectoris: Secondary | ICD-10-CM

## 2013-05-26 LAB — PACEMAKER DEVICE OBSERVATION
AL AMPLITUDE: 2.3 mv
AL IMPEDENCE PM: 213 Ohm
BATTERY VOLTAGE: 2.78 V
RV LEAD IMPEDENCE PM: 551 Ohm
VENTRICULAR PACING PM: 33

## 2013-05-26 NOTE — Assessment & Plan Note (Signed)
Stable post pacing 

## 2013-05-26 NOTE — Progress Notes (Signed)
Patient Care Team: Corwin Levins, MD as PCP - General   HPI  [name] is a 47 y.o. male  seen in followup for syncope associated with bradycardia and sinus node dysfunction his paroxysmal atrial fibrillation. He is status post pacemaker implantation.  He also has a history of coronary disease status post drug-eluting stenting to his right coronary artery with near normal left ventricular function.  He denies problems with chest pain or shortness of breath. His exercise tolerance is quite good.   Past Medical History  Diagnosis Date  . Hyperlipidemia   . Hypertension   . GERD (gastroesophageal reflux disease)   . Depression   . Lumbar disc disease   . Glucose intolerance (impaired glucose tolerance)   . Atrial fibrillation   . Syncope   . Hiatal hernia   . Low back pain   . H/O: Bell's palsy   . ABDOMINAL PAIN OTHER SPECIFIED SITE 12/09/2010  . BACK PAIN 10/12/2007  . BELL'S PALSY, RIGHT 11/30/2008  . CAD, NATIVE VESSEL 09/11/2009  . CHEST PAIN UNSPECIFIED 09/11/2009  . DEPRESSION 10/12/2007  . DISC DISEASE, LUMBAR 10/12/2007  . GERD 10/12/2007  . GLUCOSE INTOLERANCE, MINIMAL, HX OF 10/12/2007  . HYPERLIPIDEMIA 10/12/2007  . HYPERSOMNIA, ASSOCIATED WITH SLEEP APNEA 11/19/2009  . HYPERTENSION 10/12/2007  . LIBIDO, DECREASED 12/02/2009  . LOW BACK PAIN 10/12/2007  . Pacemaker-st judes 05/19/2011  . PULMONARY NODULE 05/03/2008  . Sinoatrial node dysfunction 11/19/2009  . Impaired glucose tolerance 10/31/2011    Past Surgical History  Procedure Laterality Date  . Cardiac pacemaker placement      [facility] [phone]  . Lumbar laminectomy      Current Outpatient Prescriptions  Medication Sig Dispense Refill  . aspirin 325 MG EC tablet Take 325 mg by mouth daily.        . clopidogrel (PLAVIX) 75 MG tablet Take 1 tablet (75 mg total) by mouth daily.  90 tablet  3  . losartan (COZAAR) 25 MG tablet Take 1 tablet (25 mg total) by mouth daily.  90 tablet  4  . metoprolol  tartrate (LOPRESSOR) 25 MG tablet Take 1 tablet (25 mg total) by mouth 2 (two) times daily.  180 tablet  3  . nitroGLYCERIN (NITROSTAT) 0.4 MG SL tablet Place 1 tablet (0.4 mg total) under the tongue every 5 (five) minutes as needed.  25 tablet  3  . rosuvastatin (CRESTOR) 40 MG tablet Take 1 tablet (40 mg total) by mouth daily.  30 tablet  6   No current facility-administered medications for this visit.    Allergies  Allergen Reactions  . Lisinopril     REACTION: cough    Review of Systems negative except from HPI and PMH  Physical Exam BP 110/80  Pulse 62  Ht 6' (1.829 m)  Wt 220 lb 12.8 oz (100.154 kg)  BMI 29.94 kg/m2 Well developed and nourished in no acute distress HENT normal Neck supple with JVP-flat Clear Regular rate and rhythm, no murmurs or gallops Abd-soft with active BS No Clubbing cyanosis edema Skin-warm and dry A & Oriented  Grossly normal sensory and motor function  ecg sinus 62  13/08/37    Assessment and  Plan

## 2013-05-26 NOTE — Patient Instructions (Addendum)
Your physician wants you to follow-up in:  6 MO WITH DEVICE CLINIC  AND  YEAR WITH DR Logan Bores will receive a reminder letter in the mail two months in advance. If you don't receive a letter, please call our office to schedule the follow-up appointment. Your physician has recommended you make the following change in your medication: DECREASE ASPIRIN TO 81 MG EVERY DAY

## 2013-05-26 NOTE — Assessment & Plan Note (Signed)
Continue current meds  Will decrease asa to 81

## 2013-05-26 NOTE — Assessment & Plan Note (Signed)
The patient's device was interrogated.  The information was reviewed. No changes were made in the programming.    

## 2013-05-26 NOTE — Assessment & Plan Note (Signed)
inferquent atrial fib  Will need to think about anticoagulation

## 2013-05-26 NOTE — Assessment & Plan Note (Signed)
Stable

## 2013-06-08 ENCOUNTER — Encounter: Payer: Self-pay | Admitting: Internal Medicine

## 2013-07-15 ENCOUNTER — Other Ambulatory Visit: Payer: Self-pay | Admitting: Internal Medicine

## 2013-07-17 ENCOUNTER — Other Ambulatory Visit: Payer: Self-pay | Admitting: *Deleted

## 2013-07-17 NOTE — Telephone Encounter (Signed)
Error

## 2013-07-28 ENCOUNTER — Other Ambulatory Visit: Payer: Self-pay | Admitting: Internal Medicine

## 2013-10-19 ENCOUNTER — Other Ambulatory Visit: Payer: Self-pay

## 2013-11-02 ENCOUNTER — Other Ambulatory Visit: Payer: Self-pay | Admitting: Internal Medicine

## 2013-11-06 ENCOUNTER — Encounter: Payer: 59 | Admitting: Internal Medicine

## 2013-11-15 ENCOUNTER — Other Ambulatory Visit (INDEPENDENT_AMBULATORY_CARE_PROVIDER_SITE_OTHER): Payer: BC Managed Care – PPO

## 2013-11-15 DIAGNOSIS — Z0000 Encounter for general adult medical examination without abnormal findings: Secondary | ICD-10-CM

## 2013-11-15 LAB — URINALYSIS, ROUTINE W REFLEX MICROSCOPIC
Ketones, ur: NEGATIVE
Leukocytes, UA: NEGATIVE
Nitrite: NEGATIVE
Specific Gravity, Urine: 1.02 (ref 1.000–1.030)
Urobilinogen, UA: 0.2 (ref 0.0–1.0)

## 2013-11-15 LAB — HEPATIC FUNCTION PANEL
ALT: 27 U/L (ref 0–53)
AST: 25 U/L (ref 0–37)
Albumin: 4.1 g/dL (ref 3.5–5.2)

## 2013-11-15 LAB — CBC WITH DIFFERENTIAL/PLATELET
Eosinophils Relative: 5.5 % — ABNORMAL HIGH (ref 0.0–5.0)
HCT: 44.3 % (ref 39.0–52.0)
Hemoglobin: 15.5 g/dL (ref 13.0–17.0)
Lymphs Abs: 1.4 10*3/uL (ref 0.7–4.0)
Monocytes Relative: 8.5 % (ref 3.0–12.0)
Neutro Abs: 2.1 10*3/uL (ref 1.4–7.7)
RDW: 12.3 % (ref 11.5–14.6)
WBC: 4 10*3/uL — ABNORMAL LOW (ref 4.5–10.5)

## 2013-11-15 LAB — BASIC METABOLIC PANEL
Calcium: 9.3 mg/dL (ref 8.4–10.5)
GFR: 95.18 mL/min (ref >60.00)
Sodium: 142 mEq/L (ref 135–145)

## 2013-11-15 LAB — PSA: PSA: 0.49 ng/mL (ref 0.10–4.00)

## 2013-11-15 LAB — TSH: TSH: 0.62 u[IU]/mL (ref 0.35–5.50)

## 2013-11-17 ENCOUNTER — Ambulatory Visit (INDEPENDENT_AMBULATORY_CARE_PROVIDER_SITE_OTHER): Payer: BC Managed Care – PPO | Admitting: Internal Medicine

## 2013-11-17 ENCOUNTER — Encounter: Payer: Self-pay | Admitting: Internal Medicine

## 2013-11-17 VITALS — BP 110/80 | HR 82 | Temp 97.7°F | Ht 71.0 in | Wt 217.4 lb

## 2013-11-17 DIAGNOSIS — Z0000 Encounter for general adult medical examination without abnormal findings: Secondary | ICD-10-CM

## 2013-11-17 NOTE — Assessment & Plan Note (Signed)

## 2013-11-17 NOTE — Progress Notes (Signed)
Subjective:    Patient ID: [name], male    DOB: 05-29-1966, 47 y.o.   MRN: [id number]  HPI  Here for wellness and f/u;  Overall doing ok;  Pt denies CP, worsening SOB, DOE, wheezing, orthopnea, PND, worsening LE edema, palpitations, dizziness or syncope.  Pt denies neurological change such as new headache, facial or extremity weakness.  Pt denies polydipsia, polyuria, or low sugar symptoms. Pt states overall good compliance with treatment and medications, good tolerability, and has been trying to follow lower cholesterol diet.  Pt denies worsening depressive symptoms, suicidal ideation or panic. No fever, night sweats, wt loss, loss of appetite, or other constitutional symptoms.  Pt states good ability with ADL's, has low fall risk, home safety reviewed and adequate, no other significant changes in hearing or vision, and only occasionally active with exercise.  No acute complaints.   Past Medical History  Diagnosis Date  . Hyperlipidemia   . Hypertension   . GERD (gastroesophageal reflux disease)   . Depression   . Lumbar disc disease   . Glucose intolerance (impaired glucose tolerance)   . Atrial fibrillation   . Syncope   . Hiatal hernia   . Low back pain   . H/O: Bell's palsy   . ABDOMINAL PAIN OTHER SPECIFIED SITE 12/09/2010  . BACK PAIN 10/12/2007  . BELL'S PALSY, RIGHT 11/30/2008  . CAD, NATIVE VESSEL 09/11/2009  . CHEST PAIN UNSPECIFIED 09/11/2009  . DEPRESSION 10/12/2007  . DISC DISEASE, LUMBAR 10/12/2007  . GERD 10/12/2007  . GLUCOSE INTOLERANCE, MINIMAL, HX OF 10/12/2007  . HYPERLIPIDEMIA 10/12/2007  . HYPERSOMNIA, ASSOCIATED WITH SLEEP APNEA 11/19/2009  . HYPERTENSION 10/12/2007  . LIBIDO, DECREASED 12/02/2009  . LOW BACK PAIN 10/12/2007  . Pacemaker-st judes 05/19/2011  . PULMONARY NODULE 05/03/2008  . Sinoatrial node dysfunction 11/19/2009  . Impaired glucose tolerance 10/31/2011   Past Surgical History  Procedure Laterality Date  . Cardiac pacemaker placement        [facility] [phone]  . Lumbar laminectomy      reports that he quit smoking about 6 years ago. He does not have any smokeless tobacco history on file. He reports that he does not drink alcohol or use illicit drugs. family history includes Coronary artery disease in his other; Diabetes in his other; Hyperlipidemia in his other; Hypertension in his other. Allergies  Allergen Reactions  . Lisinopril     REACTION: cough   Current Outpatient Prescriptions on File Prior to Visit  Medication Sig Dispense Refill  . aspirin 325 MG EC tablet Take 325 mg by mouth daily.        . clopidogrel (PLAVIX) 75 MG tablet TAKE ONE TABLET BY MOUTH ONCE DAILY  30 tablet  6  . CRESTOR 40 MG tablet TAKE ONE TABLET BY MOUTH ONCE DAILY  30 tablet  0  . losartan (COZAAR) 25 MG tablet TAKE ONE TABLET BY MOUTH ONCE DAILY  90 tablet  3  . metoprolol tartrate (LOPRESSOR) 25 MG tablet Take 1 tablet (25 mg total) by mouth 2 (two) times daily.  180 tablet  3  . nitroGLYCERIN (NITROSTAT) 0.4 MG SL tablet Place 1 tablet (0.4 mg total) under the tongue every 5 (five) minutes as needed.  25 tablet  3   No current facility-administered medications on file prior to visit.   Review of Systems Constitutional: Negative for diaphoresis, activity change, appetite change or unexpected weight change.  HENT: Negative for hearing loss, ear pain, facial swelling, mouth  sores and neck stiffness.   Eyes: Negative for pain, redness and visual disturbance.  Respiratory: Negative for shortness of breath and wheezing.   Cardiovascular: Negative for chest pain and palpitations.  Gastrointestinal: Negative for diarrhea, blood in stool, abdominal distention or other pain Genitourinary: Negative for hematuria, flank pain or change in urine volume.  Musculoskeletal: Negative for myalgias and joint swelling.  Skin: Negative for color change and wound.  Neurological: Negative for syncope and numbness. other than noted Hematological: Negative  for adenopathy.  Psychiatric/Behavioral: Negative for hallucinations, self-injury, decreased concentration and agitation.      Objective:   Physical Exam BP 110/80  Pulse 82  Temp(Src) 97.7 F (36.5 C) (Oral)  Ht 5\' 11"  (1.803 m)  Wt 217 lb 6 oz (98.601 kg)  BMI 30.33 kg/m2  SpO2 95% VS noted,  Constitutional: Pt is oriented to person, place, and time. Appears well-developed and well-nourished.  Head: Normocephalic and atraumatic.  Right Ear: External ear normal.  Left Ear: External ear normal.  Nose: Nose normal.  Mouth/Throat: Oropharynx is clear and moist.  Eyes: Conjunctivae and EOM are normal. Pupils are equal, round, and reactive to light.  Neck: Normal range of motion. Neck supple. No JVD present. No tracheal deviation present.  Cardiovascular: Normal rate, regular rhythm, normal heart sounds and intact distal pulses.   Pulmonary/Chest: Effort normal and breath sounds normal.  Abdominal: Soft. Bowel sounds are normal. There is no tenderness. No HSM  Musculoskeletal: Normal range of motion. Exhibits no edema.  Lymphadenopathy:  Has no cervical adenopathy.  Neurological: Pt is alert and oriented to person, place, and time. Pt has normal reflexes. No cranial nerve deficit.  Skin: Skin is warm and dry. No rash noted.  Psychiatric:  Has  normal mood and affect. Behavior is normal.     Assessment & Plan:

## 2013-11-17 NOTE — Progress Notes (Signed)
Pre-visit discussion using our clinic review tool. No additional management support is needed unless otherwise documented below in the visit note.  

## 2013-11-17 NOTE — Patient Instructions (Signed)
Please continue all other medications as before, and refills have been done if requested. Please have the pharmacy call with any other refills you may need.  Please continue your efforts at being more active, low cholesterol diet, and weight control. You are otherwise up to date with prevention measures today.  Please keep your appointments with your specialists as you have planned  Please remember to sign up for MyChart if you have not done so, as this will be important to you in the future with finding out test results, communicating by private email, and scheduling acute appointments online when needed.  Please return in 1 year for your yearly visit, or sooner if needed, with Lab testing done 3-5 days before  

## 2013-11-19 ENCOUNTER — Other Ambulatory Visit: Payer: Self-pay | Admitting: Internal Medicine

## 2013-11-22 ENCOUNTER — Ambulatory Visit (INDEPENDENT_AMBULATORY_CARE_PROVIDER_SITE_OTHER): Payer: BC Managed Care – PPO | Admitting: *Deleted

## 2013-11-22 DIAGNOSIS — I4891 Unspecified atrial fibrillation: Secondary | ICD-10-CM

## 2013-11-22 LAB — MDC_IDC_ENUM_SESS_TYPE_INCLINIC

## 2013-11-22 NOTE — Progress Notes (Signed)
Pacemaker check in clinic. Normal device function. Thresholds, sensing, impedances consistent with previous measurements. Device programmed to maximize longevity. 3.4% A-fib, + plavix.  No high ventricular rates noted. Device programmed at appropriate safety margins. Histogram distribution appropriate for patient activity level. Device programmed to optimize intrinsic conduction. Estimated longevity 3.5 years.  Patient education completed.  ROV 6 months with Dr. Graciela Husbands.

## 2013-11-24 ENCOUNTER — Encounter: Payer: Self-pay | Admitting: Internal Medicine

## 2013-11-24 DIAGNOSIS — E785 Hyperlipidemia, unspecified: Secondary | ICD-10-CM

## 2013-12-05 ENCOUNTER — Other Ambulatory Visit: Payer: Self-pay | Admitting: Internal Medicine

## 2013-12-06 ENCOUNTER — Other Ambulatory Visit: Payer: Self-pay | Admitting: *Deleted

## 2013-12-06 MED ORDER — ROSUVASTATIN CALCIUM 40 MG PO TABS: 40.0000 mg | ORAL_TABLET | Freq: Every day | ORAL | Status: DC

## 2014-01-09 ENCOUNTER — Encounter: Payer: Self-pay | Admitting: Internal Medicine

## 2014-02-05 ENCOUNTER — Encounter: Payer: Self-pay | Admitting: *Deleted

## 2014-02-05 ENCOUNTER — Encounter: Payer: Self-pay | Admitting: Internal Medicine

## 2014-02-05 ENCOUNTER — Ambulatory Visit (INDEPENDENT_AMBULATORY_CARE_PROVIDER_SITE_OTHER)
Admission: RE | Admit: 2014-02-05 | Discharge: 2014-02-05 | Disposition: A | Discharge status: Home / Self Care | Payer: BC Managed Care – PPO | Source: Ambulatory Visit | Attending: Internal Medicine | Admitting: Internal Medicine

## 2014-02-05 ENCOUNTER — Ambulatory Visit (INDEPENDENT_AMBULATORY_CARE_PROVIDER_SITE_OTHER): Payer: BC Managed Care – PPO | Admitting: Internal Medicine

## 2014-02-05 VITALS — BP 148/100 | HR 79 | Temp 98.0°F | Wt 218.2 lb

## 2014-02-05 DIAGNOSIS — M5412 Radiculopathy, cervical region: Secondary | ICD-10-CM

## 2014-02-05 DIAGNOSIS — Z950 Presence of cardiac pacemaker: Secondary | ICD-10-CM

## 2014-02-05 DIAGNOSIS — M5010 Cervical disc disorder with radiculopathy, unspecified cervical region: Secondary | ICD-10-CM

## 2014-02-05 DIAGNOSIS — I10 Essential (primary) hypertension: Secondary | ICD-10-CM

## 2014-02-05 MED ORDER — HYDROCODONE-ACETAMINOPHEN 5-325 MG PO TABS: 1.0000 | ORAL_TABLET | Freq: Four times a day (QID) | ORAL | Status: DC | PRN

## 2014-02-05 MED ORDER — PREDNISONE (PAK) 10 MG PO TABS: ORAL_TABLET | ORAL | Status: DC

## 2014-02-05 MED ORDER — CYCLOBENZAPRINE HCL 5 MG PO TABS: 5.0000 mg | ORAL_TABLET | Freq: Three times a day (TID) | ORAL | Status: DC | PRN

## 2014-02-05 NOTE — Progress Notes (Signed)
Pre-visit discussion using our clinic review tool. No additional management support is needed unless otherwise documented below in the visit note.  

## 2014-02-05 NOTE — Progress Notes (Signed)
Subjective:    Patient ID: Derrick CortiJames D Solomon, male    DOB: [birth year], 48 y.o.   MRN: [id number]  HPI  complains of right side neck pain -  onset 2 weeks ago - Radiates into RUE - numbness and pain but not associated with weakness worse lying supine Not improved with ibuprofen 400mg  or heat pad   Also reviewed chronic medical issues and interval medical events  Past Medical History  Diagnosis Date  . Hyperlipidemia   . Hypertension   . GERD (gastroesophageal reflux disease)   . Depression   . Lumbar disc disease   . Glucose intolerance (impaired glucose tolerance)   . Atrial fibrillation   . Syncope   . Hiatal hernia   . Low back pain   . H/O: Bell's palsy   . ABDOMINAL PAIN OTHER SPECIFIED SITE 12/09/2010  . BACK PAIN 10/12/2007  . BELL'S PALSY, RIGHT 11/30/2008  . CAD, NATIVE VESSEL 09/11/2009  . CHEST PAIN UNSPECIFIED 09/11/2009  . DEPRESSION 10/12/2007  . DISC DISEASE, LUMBAR 10/12/2007  . GERD 10/12/2007  . GLUCOSE INTOLERANCE, MINIMAL, HX OF 10/12/2007  . HYPERLIPIDEMIA 10/12/2007  . HYPERSOMNIA, ASSOCIATED WITH SLEEP APNEA 11/19/2009  . HYPERTENSION 10/12/2007  . LIBIDO, DECREASED 12/02/2009  . LOW BACK PAIN 10/12/2007  . Pacemaker-st judes 05/19/2011  . PULMONARY NODULE 05/03/2008  . Sinoatrial node dysfunction 11/19/2009  . Impaired glucose tolerance 10/31/2011    Review of Systems  Constitutional: Negative for fever and fatigue.  Respiratory: Negative for cough and shortness of breath.   Cardiovascular: Negative for chest pain and leg swelling.       Objective:   Physical Exam  BP 148/100  Pulse 79  Temp(Src) 98 F (36.7 C) (Oral)  Wt 218 lb 3.2 oz (98.975 kg)  SpO2 95% Wt Readings from Last 3 Encounters:  02/05/14 218 lb 3.2 oz (98.975 kg)  11/17/13 217 lb 6 oz (98.601 kg)  05/26/13 220 lb 12.8 oz (100.154 kg)   Constitutional: he appears uncomfortable due to pain,but well-developed and well-nourished. No distress. Wife at side Neck: Normal  range of motion. Neck supple. No JVD present. No thyromegaly present.  Cardiovascular: Normal rate, regular rhythm and normal heart sounds.  No murmur heard. No BLE edema. Pulmonary/Chest: Effort normal and breath sounds normal. No respiratory distress. he has no wheezes.  MSkel: neck with right sided tightness and spasm along trapezius. Full range of motion with limitations by pain to rotation on affected side. Right shoulder knee wrist and arm with full strength, distally neurovascularly intact. Described paresthesias along C5-6 region on RUE. Psychiatric: he has a normal mood and affect. His behavior is normal. Judgment and thought content normal.   Lab Results  Component Value Date   WBC 4.0* 11/15/2013   HGB 15.5 11/15/2013   HCT 44.3 11/15/2013   PLT 184.0 11/15/2013   GLUCOSE 97 11/15/2013   CHOL 113 10/31/2012   TRIG 78.0 10/31/2012   HDL 32.30* 10/31/2012   LDLCALC 65 10/31/2012   ALT 27 11/15/2013   AST 25 11/15/2013   NA 142 11/15/2013   K 4.5 11/15/2013   CL 109 11/15/2013   CREATININE 0.9 11/15/2013   BUN 16 11/15/2013   CO2 28 11/15/2013   TSH 0.62 11/15/2013   PSA 0.49 11/15/2013   INR 1.1 08/25/2009   HGBA1C 4.9 10/31/2012    No results found.     Assessment & Plan:   Acute Right neck and arm pain - examined history consistent with nerve impingement.  Suspect cervical DDD with radicular manifestation. Patient unable to have MRI because of pacemakers will arrange for CT cervical spine. Check plain film today. Trachea inflammation with prednisone taper and muscle spasm with scheduled relaxer. Hydrocodone as needed for severe uncontrolled pain symptoms pending further diagnostic imaging and treatment.

## 2014-02-05 NOTE — Assessment & Plan Note (Signed)
BP Readings from Last 3 Encounters:  02/05/14 148/100  11/17/13 110/80  05/26/13 110/80   Generally well controlled, suspect acute exacerbation by pain No changes to medications recommended at this time to follow with primary care physician in next 2-4 weeks for review of symptoms. Consider alteration of medications if remains in controlled at that time

## 2014-02-05 NOTE — Patient Instructions (Addendum)
It was good to see you today.  We have reviewed your prior records including labs and tests today  Test(s) ordered today -xray and refer for CT neck. Your results will be released to MyChart (or called to you) after review, usually within 72hours after test completion. If any changes need to be made, you will be notified at that same time.  Medications reviewed and updated  Prednisone taper over the next 6 days, Flexeril 3 times a day for the next 72 hours, then at night. Use hydrocodone as needed for severe pain unrelieved by other medications  Please schedule followup in 3-4weeks with PCP to review, call sooner if problems.   Cervical Radiculopathy Cervical radiculopathy happens when a nerve in the neck is pinched or bruised by a slipped (herniated) disk or by arthritic changes in the bones of the cervical spine. This can occur due to an injury or as part of the normal aging process. Pressure on the cervical nerves can cause pain or numbness that runs from your neck all the way down into your arm and fingers. CAUSES  There are many possible causes, including:  Injury.  Muscle tightness in the neck from overuse.  Swollen, painful joints (arthritis).  Breakdown or degeneration in the bones and joints of the spine (spondylosis) due to aging.  Bone spurs that may develop near the cervical nerves. SYMPTOMS  Symptoms include pain, weakness, or numbness in the affected arm and hand. Pain can be severe or irritating. Symptoms may be worse when extending or turning the neck. DIAGNOSIS  Your caregiver will ask about your symptoms and do a physical exam. He or she may test your strength and reflexes. X-rays, CT scans, and MRI scans may be needed in cases of injury or if the symptoms do not go away after a period of time. Electromyography (EMG) or nerve conduction testing may be done to study how your nerves and muscles are working. TREATMENT  Your caregiver may recommend certain exercises to  help relieve your symptoms. Cervical radiculopathy can, and often does, get better with time and treatment. If your problems continue, treatment options may include:  Wearing a soft collar for short periods of time.  Physical therapy to strengthen the neck muscles.  Medicines, such as nonsteroidal anti-inflammatory drugs (NSAIDs), oral corticosteroids, or spinal injections.  Surgery. Different types of surgery may be done depending on the cause of your problems. HOME CARE INSTRUCTIONS   Put ice on the affected area.  Put ice in a plastic bag.  Place a towel between your skin and the bag.  Leave the ice on for 15-20 minutes, 03-04 times a day or as directed by your caregiver.  If ice does not help, you can try using heat. Take a warm shower or bath, or use a hot water bottle as directed by your caregiver.  You may try a gentle neck and shoulder massage.  Use a flat pillow when you sleep.  Only take over-the-counter or prescription medicines for pain, discomfort, or fever as directed by your caregiver.  If physical therapy was prescribed, follow your caregiver's directions.  If a soft collar was prescribed, use it as directed. SEEK IMMEDIATE MEDICAL CARE IF:   Your pain gets much worse and cannot be controlled with medicines.  You have weakness or numbness in your hand, arm, face, or leg.  You have a high fever or a stiff, rigid neck.  You lose bowel or bladder control (incontinence).  You have trouble with walking, balance,  or speaking. MAKE SURE YOU:   Understand these instructions.  Will watch your condition.  Will get help right away if you are not doing well or get worse. Document Released: 08/25/2001 Document Revised: 02/22/2012 Document Reviewed: 07/14/2011 [facility] Patient Information 2014 [employer], [place].

## 2014-02-05 NOTE — Progress Notes (Signed)
TIA GELB [id number] 02/05/2014   Chief Complaint  Patient presents with  . Back Pain    Upper back pian    Subjective  Back Pain This is a new problem. Episode onset: 2 weeks ago with no acute event or trauma. The problem occurs daily. The problem has been gradually worsening since onset. Pain location: upper thoracic and posterior neck. Radiates to: Right medial arm and posterior fingers 1-3. The pain is moderate. The pain is worse during the night. The symptoms are aggravated by lying down and sitting. Associated symptoms include numbness (Posterior R hand). Pertinent negatives include no abdominal pain, bladder incontinence, bowel incontinence, chest pain, dysuria, fever, headaches, weakness or weight loss. He has tried NSAIDs (Alieve and Ibuprofen 400) for the symptoms. The treatment provided mild relief.   His BP is elevated today 148/100.  He reports compliance with HTN medications.  He denies any cardiovascular or respiratory symptoms including CP, palpitation, central back pain, epigastric pain, shortness of breath or pleuritic pain.  He does not monitor his BP at home.  Past Medical History  Diagnosis Date  . Hyperlipidemia   . Hypertension   . GERD (gastroesophageal reflux disease)   . Depression   . Lumbar disc disease   . Glucose intolerance (impaired glucose tolerance)   . Atrial fibrillation   . Syncope   . Hiatal hernia   . Low back pain   . H/O: Bell's palsy   . CAD, NATIVE VESSEL 09/11/2009  . DEPRESSION   . DISC DISEASE, LUMBAR   . GERD   . HYPERLIPIDEMIA   . HYPERSOMNIA, ASSOCIATED WITH SLEEP APNEA   . HYPERTENSION   . LIBIDO, DECREASED   . Pacemaker-st judes 2012  . PULMONARY NODULE 05/03/2008  . Sinoatrial node dysfunction   . Impaired glucose tolerance     Past Surgical History  Procedure Laterality Date  . Cardiac pacemaker placement      [facility] [phone]  . Lumbar laminectomy      Family History  Problem Relation Age of Onset  .  Coronary artery disease Other   . Diabetes Other   . Hypertension Other   . Hyperlipidemia Other     History  Substance Use Topics  . Smoking status: Former Smoker    Quit date: 08/15/2007  . Smokeless tobacco: Not on file  . Alcohol Use: No    Current Outpatient Prescriptions on File Prior to Visit  Medication Sig Dispense Refill  . clopidogrel (PLAVIX) 75 MG tablet TAKE ONE TABLET BY MOUTH ONCE DAILY  30 tablet  6  . losartan (COZAAR) 25 MG tablet TAKE ONE TABLET BY MOUTH ONCE DAILY  90 tablet  3  . metoprolol tartrate (LOPRESSOR) 25 MG tablet TAKE ONE TABLET BY MOUTH TWICE DAILY  180 tablet  0  . nitroGLYCERIN (NITROSTAT) 0.4 MG SL tablet Place 1 tablet (0.4 mg total) under the tongue every 5 (five) minutes as needed.  25 tablet  3  . rosuvastatin (CRESTOR) 40 MG tablet Take 1 tablet (40 mg total) by mouth daily.  30 tablet  6   No current facility-administered medications on file prior to visit.    Allergies:  Allergies  Allergen Reactions  . Lisinopril     REACTION: cough    Review of Systems  Constitutional: Negative for fever and weight loss.  HENT: Negative for hearing loss.   Eyes: Positive for double vision.  Cardiovascular: Negative for chest pain.  Gastrointestinal: Negative for heartburn, nausea, vomiting,  abdominal pain, diarrhea, constipation and bowel incontinence.  Genitourinary: Negative for bladder incontinence and dysuria.  Musculoskeletal: Positive for back pain.  Neurological: Positive for sensory change (Right hand has felt numb especially after lying down.) and numbness (Posterior R hand). Negative for focal weakness, weakness and headaches.      Objective  Filed Vitals:   02/05/14 1116  BP: 148/100  Pulse: 79  Temp: 98 F (36.7 C)  TempSrc: Oral  Weight: 218 lb 3.2 oz (98.975 kg)  SpO2: 95%    Physical Exam  Constitutional: He is oriented to person, place, and time. He appears well-developed and well-nourished.  Appears tense through  the neck and shoulders.  HENT:  Head: Normocephalic and atraumatic.  Neck:  Full ROM but feels some tightness and radiating pain with lateral movement.  Cardiovascular: Normal rate, regular rhythm and normal heart sounds.   Respiratory: Effort normal and breath sounds normal. No respiratory distress. He has no wheezes. He has no rales.  GI: Soft. Bowel sounds are normal. He exhibits no distension. There is no tenderness.  No bruits heard  Musculoskeletal: Normal range of motion. He exhibits no edema.       Arms: Strength symmetrical in arms/hands.  Pain exacerbated by lying down.  Radiation with lateral bend of torso to right side.  Relieved with R arm raised over head.  Neurological: He is alert and oriented to person, place, and time.  Skin: Skin is warm and dry. No rash noted. No erythema.  Psychiatric: His behavior is normal. Judgment and thought content normal.    BP Readings from Last 3 Encounters:  02/05/14 148/100  11/17/13 110/80  05/26/13 110/80    Wt Readings from Last 3 Encounters:  02/05/14 218 lb 3.2 oz (98.975 kg)  11/17/13 217 lb 6 oz (98.601 kg)  05/26/13 220 lb 12.8 oz (100.154 kg)    Lab Results  Component Value Date   WBC 4.0* 11/15/2013   HGB 15.5 11/15/2013   HCT 44.3 11/15/2013   PLT 184.0 11/15/2013   GLUCOSE 97 11/15/2013   CHOL 113 10/31/2012   TRIG 78.0 10/31/2012   HDL 32.30* 10/31/2012   LDLCALC 65 10/31/2012   ALT 27 11/15/2013   AST 25 11/15/2013   NA 142 11/15/2013   K 4.5 11/15/2013   CL 109 11/15/2013   CREATININE 0.9 11/15/2013   BUN 16 11/15/2013   CO2 28 11/15/2013   TSH 0.62 11/15/2013   PSA 0.49 11/15/2013   INR 1.1 08/25/2009   HGBA1C 4.9 10/31/2012      Assessment and Plan   1. RUE pain-Suspected ruptured disk with nerve compression   MRI not possible because of cardiac pacer. XR of Spine and Cervical CT  6 day predpack to reduce inflammation. Flexeril TID especially at night to relax spasms  Hydrocodone for pain   Rest - work  note for today and tomorrow to reduce acute inflammation.   Ice and heat for symptomatic relief.  2. HTN - no acute change to medication.  Elevation likely due to acute pain/discomfort.  Consider modification if HTN does not resolve with resolution of pain.   Return in about 3 weeks (around 02/26/2014), or if symptoms worsen or fail to improve.  Evie LacksMartensen, Henry C, Student-PA    I have personally reviewed this case with PA student. I also personally examined this patient. I agree with history and findings as documented above. I reviewed, discussed and approve of the assessment and plan as listed above. Rene PaciValerie Leschber, MD

## 2014-02-09 ENCOUNTER — Ambulatory Visit (INDEPENDENT_AMBULATORY_CARE_PROVIDER_SITE_OTHER)
Admission: RE | Admit: 2014-02-09 | Discharge: 2014-02-09 | Disposition: A | Discharge status: Home / Self Care | Payer: BC Managed Care – PPO | Source: Ambulatory Visit | Attending: Internal Medicine | Admitting: Internal Medicine

## 2014-02-09 ENCOUNTER — Telehealth: Payer: Self-pay | Admitting: *Deleted

## 2014-02-09 DIAGNOSIS — M5010 Cervical disc disorder with radiculopathy, unspecified cervical region: Secondary | ICD-10-CM

## 2014-02-09 DIAGNOSIS — M5412 Radiculopathy, cervical region: Secondary | ICD-10-CM

## 2014-02-09 NOTE — Telephone Encounter (Signed)
Ok.  I haven't either.

## 2014-02-09 NOTE — Telephone Encounter (Signed)
I have not call this patient...[name]/lmb

## 2014-02-09 NOTE — Telephone Encounter (Signed)
Spouse phoned stating she was returning [name]'s phone call   CB# [phone]

## 2014-02-13 ENCOUNTER — Encounter: Payer: Self-pay | Admitting: Internal Medicine

## 2014-02-13 ENCOUNTER — Telehealth: Payer: Self-pay

## 2014-02-13 MED ORDER — PREDNISONE (PAK) 10 MG PO TABS: ORAL_TABLET | ORAL | Status: DC

## 2014-02-13 MED ORDER — HYDROCODONE-ACETAMINOPHEN 5-325 MG PO TABS: 1.0000 | ORAL_TABLET | Freq: Four times a day (QID) | ORAL | Status: DC | PRN

## 2014-02-13 NOTE — Telephone Encounter (Signed)
Robin to see above  predpack done erx

## 2014-02-13 NOTE — Telephone Encounter (Signed)
See below

## 2014-02-13 NOTE — Telephone Encounter (Signed)
Done hardcopy to robin  

## 2014-02-13 NOTE — Addendum Note (Signed)
Addended by: Corwin LevinsJOHN, Iaan W on: 02/13/2014 03:44 PM   Modules accepted: Orders

## 2014-02-13 NOTE — Telephone Encounter (Signed)
Patient's wife informed

## 2014-02-13 NOTE — Telephone Encounter (Signed)
The patient's wife called and is hoping to get medication called in for his pain   Wife's callback - [phone]

## 2014-02-13 NOTE — Telephone Encounter (Signed)
Prednisone has already been refilled

## 2014-02-13 NOTE — Telephone Encounter (Signed)
Called the wife and she stated he has plenty hydrocodone.  States he started having more pain after he finished the prednisone.  Advise on something to take during the day time that will not make him sleepy.  Hydrocodone script is on MD's desk to discard as does not need.

## 2014-02-23 ENCOUNTER — Other Ambulatory Visit: Payer: Self-pay | Admitting: *Deleted

## 2014-02-23 MED ORDER — CLOPIDOGREL BISULFATE 75 MG PO TABS: ORAL_TABLET | ORAL | Status: DC

## 2014-03-16 ENCOUNTER — Other Ambulatory Visit: Payer: Self-pay

## 2014-03-16 MED ORDER — METOPROLOL TARTRATE 25 MG PO TABS: ORAL_TABLET | ORAL | Status: DC

## 2014-03-28 ENCOUNTER — Encounter: Payer: Self-pay | Admitting: Internal Medicine

## 2014-03-28 ENCOUNTER — Other Ambulatory Visit: Payer: Self-pay | Admitting: Internal Medicine

## 2014-03-28 ENCOUNTER — Ambulatory Visit (INDEPENDENT_AMBULATORY_CARE_PROVIDER_SITE_OTHER): Payer: BC Managed Care – PPO | Admitting: Internal Medicine

## 2014-03-28 VITALS — BP 126/84 | HR 80 | Temp 98.4°F | Wt 219.8 lb

## 2014-03-28 DIAGNOSIS — L255 Unspecified contact dermatitis due to plants, except food: Secondary | ICD-10-CM

## 2014-03-28 MED ORDER — HYDROXYZINE HCL 10 MG PO TABS: 10.0000 mg | ORAL_TABLET | Freq: Four times a day (QID) | ORAL | Status: DC | PRN

## 2014-03-28 MED ORDER — MOMETASONE FUROATE 0.1 % EX OINT: TOPICAL_OINTMENT | CUTANEOUS | Status: DC

## 2014-03-28 MED ORDER — PREDNISONE 20 MG PO TABS: 20.0000 mg | ORAL_TABLET | Freq: Every day | ORAL | Status: DC

## 2014-03-28 NOTE — Progress Notes (Signed)
   Subjective:    Patient ID: [name], male    DOB: 1966/08/29, 48 y.o.   MRN: [id number]  HPI He presents with complaints of red pruritus rash noticed yesterday on L cheek and L lower eye lid, posterior neck, right arm and right lateral trunk area.  He reports working in yard on Saturday and is unsure if this was the exposure or possibly from his dog. He has been exposed to [facility] [place] several times before, also near his eye in the past.  He specifically denies fever, chills, sweats, lip or tongue swelling, difficulty breathing. Also denies pain with ocular motion, loss of vision.   Review of Systems Denies fever, chills, sweats, nausea, vomiting, lip or tongue swelling.    Objective:   Physical Exam        Assessment & Plan:

## 2014-03-28 NOTE — Patient Instructions (Signed)
The prednisone can be discontinued once the rash & itching have been resolved for 36-48 hours; it dose not need to be weaned. Apply Cort Aid OTC twice a day to the facial lesions. Do not get this topical steroid into eyes.

## 2014-03-28 NOTE — Progress Notes (Signed)
   Subjective:    Patient ID: Derrick CortiJames D Geathers, male    DOB: 17-Oct-1966, 48 y.o.   MRN: [id number]  HPI He presents with complaints of red pruritic rash noticed yesterday on L cheek and L lower eye lid, posterior neck, right arm and right lateral trunk area.  He reports working in yard on Saturday and is unsure if this was the exposure or possibly from his dog. [facility] before, also near his eye in the past.  He specifically denies fever, chills, sweats, lip or tongue swelling, difficulty breathing. Also denies pain with ocular motion, loss of vision.   Review of Systems Denies fever, chills, sweats, nausea, vomiting, lip or tongue swelling.     Objective:   Physical Exam General appearance:good health ;well nourished; no acute distress or increased work of breathing is present.   No  lymphadenopathy about the head, neck, or axilla noted.   Eyes: No conjunctival inflammation or lid edema is present. There is no scleral icterus.  Ears:  External ear exam shows no significant lesions or deformities.  Otoscopic examination reveals clear canals, tympanic membranes are intact bilaterally without bulging, retraction, inflammation or discharge.  Nose:  External nasal examination shows no deformity or inflammation. Nasal mucosa are pink and moist without lesions or exudates. No septal dislocation or deviation.No obstruction to airflow.   Oral exam: Dental hygiene is good; lips and gums are healthy appearing.There is no oropharyngeal erythema or exudate noted.   Neck:  No deformities, thyromegaly, masses, or tenderness noted.   Supple with full range of motion without pain.   Heart:  Normal rate and regular rhythm. S1 and S2 normal without gallop, murmur, click, rub or other extra sounds.   Lungs:Chest clear to auscultation; no wheezes, rhonchi,rales ,or rubs present.No increased work of breathing.    Extremities:  No cyanosis, edema, or clubbing  noted    Skin: Warm & dry w/o jaundice or tenting. Linear papular lesions 5 cm x 0.5 cm on left posterolateral neck; medial aspect of L eye revealed minor raised erythema, diffuse papules noted on trunk and forearms.       Assessment & Plan:  #1 Rhus dermatitis . Despite linear character of the lesion on the left neck; there is no suggestion of herpetic lesion. No evidence of cellulitis clinically. See orders

## 2014-03-28 NOTE — Progress Notes (Signed)
Pre visit review using our clinic review tool, if applicable. No additional management support is needed unless otherwise documented below in the visit note. 

## 2014-05-18 ENCOUNTER — Ambulatory Visit: Payer: BC Managed Care – PPO | Admitting: Internal Medicine

## 2014-06-04 ENCOUNTER — Ambulatory Visit (INDEPENDENT_AMBULATORY_CARE_PROVIDER_SITE_OTHER): Payer: BC Managed Care – PPO | Admitting: Internal Medicine

## 2014-06-04 ENCOUNTER — Encounter: Payer: Self-pay | Admitting: Internal Medicine

## 2014-06-04 VITALS — BP 124/77 | HR 62 | Ht 71.0 in | Wt 217.0 lb

## 2014-06-04 DIAGNOSIS — I4891 Unspecified atrial fibrillation: Secondary | ICD-10-CM

## 2014-06-04 DIAGNOSIS — I480 Paroxysmal atrial fibrillation: Secondary | ICD-10-CM

## 2014-06-04 DIAGNOSIS — I495 Sick sinus syndrome: Secondary | ICD-10-CM

## 2014-06-04 DIAGNOSIS — Z950 Presence of cardiac pacemaker: Secondary | ICD-10-CM

## 2014-06-04 LAB — MDC_IDC_ENUM_SESS_TYPE_INCLINIC
Battery Impedance: 2500 Ohm
Battery Voltage: 2.75 V
Brady Statistic RA Percent Paced: 78 %
Date Time Interrogation Session: 20150622165904
Implantable Pulse Generator Model: 5826
Implantable Pulse Generator Serial Number: 1934644
Lead Channel Impedance Value: 287 Ohm
Lead Channel Pacing Threshold Amplitude: 0.75 V
Lead Channel Pacing Threshold Pulse Width: 0.4 ms
Lead Channel Sensing Intrinsic Amplitude: 12 mV
Lead Channel Sensing Intrinsic Amplitude: 2.6 mV
Lead Channel Setting Pacing Pulse Width: 0.4 ms
Lead Channel Setting Sensing Sensitivity: 2.5 mV
MDC IDC MSMT LEADCHNL RA PACING THRESHOLD PULSEWIDTH: 0.4 ms
MDC IDC MSMT LEADCHNL RV IMPEDANCE VALUE: 529 Ohm
MDC IDC MSMT LEADCHNL RV PACING THRESHOLD AMPLITUDE: 1.125 V
MDC IDC SET LEADCHNL RA PACING AMPLITUDE: 2 V
MDC IDC STAT BRADY RV PERCENT PACED: 29 %

## 2014-06-04 NOTE — Progress Notes (Signed)
      Patient Care Team: Corwin LevinsJames W [name], MD as PCP - General   HPI  Derrick CortiJames D Solomon is a 48 y.o. male seen in followup for syncope associated with bradycardia and sinus node dysfunction his paroxysmal atrial fibrillation. He is status post pacemaker implantation.  He also has a history of coronary disease status post drug-eluting stenting to his right coronary artery with near normal left ventricular function.  He denies problems with chest pain or shortness of breath. His exercise tolerance is quite good.    Past Medical History  Diagnosis Date  . Hyperlipidemia   . Hypertension   . GERD (gastroesophageal reflux disease)   . Depression   . Lumbar disc disease   . Glucose intolerance (impaired glucose tolerance)   . Atrial fibrillation   . Syncope   . Hiatal hernia   . Low back pain   . H/O: Bell's palsy   . CAD, NATIVE VESSEL 09/11/2009  . DEPRESSION   . DISC DISEASE, LUMBAR   . HYPERSOMNIA, ASSOCIATED WITH SLEEP APNEA   . LIBIDO, DECREASED   . Pacemaker-st judes 2012  . PULMONARY NODULE 05/03/2008  . Sinoatrial node dysfunction   . Atrial lead impedance-intermittently low 05/30/2012    Past Surgical History  Procedure Laterality Date  . [facility]      [phone]  . Lumbar laminectomy      Current Outpatient Prescriptions  Medication Sig Dispense Refill  . aspirin 81 MG tablet Take 81 mg by mouth daily.      . clopidogrel (PLAVIX) 75 MG tablet TAKE ONE TABLET BY MOUTH ONCE DAILY  30 tablet  3  . losartan (COZAAR) 25 MG tablet TAKE ONE TABLET BY MOUTH ONCE DAILY  90 tablet  3  . metoprolol tartrate (LOPRESSOR) 25 MG tablet TAKE ONE TABLET BY MOUTH TWICE DAILY  180 tablet  2  . nitroGLYCERIN (NITROSTAT) 0.4 MG SL tablet Place 1 tablet (0.4 mg total) under the tongue every 5 (five) minutes as needed.  25 tablet  3  . rosuvastatin (CRESTOR) 40 MG tablet Take 1 tablet (40 mg total) by mouth daily.  30 tablet  6   No current facility-administered  medications for this visit.    Allergies  Allergen Reactions  . Lisinopril     REACTION: cough    Review of Systems negative except from HPI and PMH  Physical Exam BP 124/77  Pulse 62  Ht 5\' 11"  (1.803 m)  Wt 217 lb (98.431 kg)  BMI 30.28 kg/m2 Well developed and well nourished in no acute distress HENT normal E scleral and icterus clear Neck Supple JVP flat; carotids brisk and full Clear to ausculation regular rate and rhythm, no murmurs gallops or rub Soft with active bowel sounds No clubbing cyanosis  Edema Alert and oriented, grossly normal motor and sensory function Skin Warm and Dry  ECG demonstrates atrial pacing. Inferior wall MI. Intervals 20/08/36 Low-voltage  Assessment and  Plan  Sinus node dysfunction  Atrial fibrillation-paroxysmal  Coronary artery disease with prior DES stenting  Pacemaker-St. Jude The patient's device was interrogated and the information was fully reviewed.  The device was reprogrammed to  Unipolar pacing  Atrial impedance-low  Heart rate excursion is adequate with atrial pacing  Without symptoms of ischemia

## 2014-07-06 ENCOUNTER — Encounter: Payer: Self-pay | Admitting: Internal Medicine

## 2014-07-06 ENCOUNTER — Other Ambulatory Visit: Payer: Self-pay | Admitting: Internal Medicine

## 2014-08-20 ENCOUNTER — Encounter: Payer: Self-pay | Admitting: Internal Medicine

## 2014-08-21 ENCOUNTER — Encounter: Payer: Self-pay | Admitting: Internal Medicine

## 2014-08-22 ENCOUNTER — Other Ambulatory Visit: Payer: Self-pay

## 2014-08-22 ENCOUNTER — Other Ambulatory Visit: Payer: Self-pay | Admitting: *Deleted

## 2014-08-22 MED ORDER — LOSARTAN POTASSIUM 25 MG PO TABS: 25.0000 mg | ORAL_TABLET | Freq: Every day | ORAL | Status: DC

## 2014-12-03 ENCOUNTER — Encounter (INDEPENDENT_AMBULATORY_CARE_PROVIDER_SITE_OTHER): Payer: BC Managed Care – PPO | Admitting: *Deleted

## 2014-12-03 DIAGNOSIS — I495 Sick sinus syndrome: Secondary | ICD-10-CM

## 2014-12-03 DIAGNOSIS — I480 Paroxysmal atrial fibrillation: Secondary | ICD-10-CM

## 2014-12-03 LAB — MDC_IDC_ENUM_SESS_TYPE_INCLINIC
Battery Voltage: 2.75 V
Brady Statistic RA Percent Paced: 81 %
Brady Statistic RV Percent Paced: 36 %
Lead Channel Pacing Threshold Amplitude: 0.5 V
Lead Channel Pacing Threshold Pulse Width: 0.4 ms
Lead Channel Sensing Intrinsic Amplitude: 2.7 mV
Lead Channel Setting Pacing Amplitude: 2 V
Lead Channel Setting Pacing Pulse Width: 0.4 ms
MDC IDC MSMT BATTERY IMPEDANCE: 3000 Ohm
MDC IDC MSMT LEADCHNL RA IMPEDANCE VALUE: 282 Ohm
MDC IDC MSMT LEADCHNL RA PACING THRESHOLD PULSEWIDTH: 0.4 ms
MDC IDC MSMT LEADCHNL RV IMPEDANCE VALUE: 529 Ohm
MDC IDC MSMT LEADCHNL RV PACING THRESHOLD AMPLITUDE: 1.125 V
MDC IDC MSMT LEADCHNL RV SENSING INTR AMPL: 12 mV
MDC IDC PG SERIAL: 1934644
MDC IDC SESS DTM: 20151221162408
MDC IDC SET LEADCHNL RV SENSING SENSITIVITY: 2.5 mV

## 2014-12-17 ENCOUNTER — Other Ambulatory Visit: Payer: Self-pay | Admitting: *Deleted

## 2014-12-17 MED ORDER — METOPROLOL TARTRATE 25 MG PO TABS: ORAL_TABLET | ORAL | Status: DC

## 2015-01-14 ENCOUNTER — Encounter: Payer: Self-pay | Admitting: Internal Medicine

## 2015-02-03 ENCOUNTER — Encounter: Payer: Self-pay | Admitting: Internal Medicine

## 2015-02-03 ENCOUNTER — Other Ambulatory Visit: Payer: Self-pay | Admitting: Internal Medicine

## 2015-02-04 ENCOUNTER — Other Ambulatory Visit: Payer: Self-pay

## 2015-02-04 MED ORDER — CLOPIDOGREL BISULFATE 75 MG PO TABS: 75.0000 mg | ORAL_TABLET | Freq: Every day | ORAL | Status: DC

## 2015-02-04 MED ORDER — ROSUVASTATIN CALCIUM 40 MG PO TABS: 40.0000 mg | ORAL_TABLET | Freq: Every day | ORAL | Status: DC

## 2015-02-12 ENCOUNTER — Encounter: Payer: Self-pay | Admitting: Internal Medicine

## 2015-02-12 DIAGNOSIS — R739 Hyperglycemia, unspecified: Secondary | ICD-10-CM

## 2015-02-12 DIAGNOSIS — Z0000 Encounter for general adult medical examination without abnormal findings: Secondary | ICD-10-CM

## 2015-02-20 ENCOUNTER — Other Ambulatory Visit (INDEPENDENT_AMBULATORY_CARE_PROVIDER_SITE_OTHER): Payer: BLUE CROSS/BLUE SHIELD

## 2015-02-20 DIAGNOSIS — Z0189 Encounter for other specified special examinations: Secondary | ICD-10-CM

## 2015-02-20 DIAGNOSIS — Z0000 Encounter for general adult medical examination without abnormal findings: Secondary | ICD-10-CM

## 2015-02-20 DIAGNOSIS — R739 Hyperglycemia, unspecified: Secondary | ICD-10-CM | POA: Diagnosis not present

## 2015-02-20 LAB — BASIC METABOLIC PANEL
BUN: 16 mg/dL (ref 6–23)
CO2: 28 mEq/L (ref 19–32)
Calcium: 9.5 mg/dL (ref 8.4–10.5)
Chloride: 107 mEq/L (ref 96–112)
Creatinine, Ser: 1.03 mg/dL (ref 0.40–1.50)
GFR: 81.04 mL/min (ref >60.00)
Glucose, Bld: 112 mg/dL — ABNORMAL HIGH (ref 70–99)
Potassium: 4.3 mEq/L (ref 3.5–5.1)
Sodium: 140 mEq/L (ref 135–145)

## 2015-02-20 LAB — CBC WITH DIFFERENTIAL/PLATELET
Basophils Absolute: 0 10*3/uL (ref 0.0–0.1)
Basophils Relative: 0.7 % (ref 0.0–3.0)
Eosinophils Absolute: 0.2 10*3/uL (ref 0.0–0.7)
Eosinophils Relative: 5.1 % — ABNORMAL HIGH (ref 0.0–5.0)
HEMATOCRIT: 44.4 % (ref 39.0–52.0)
HEMOGLOBIN: 15.7 g/dL (ref 13.0–17.0)
Lymphocytes Relative: 31.9 % (ref 12.0–46.0)
Lymphs Abs: 1.4 10*3/uL (ref 0.7–4.0)
MCHC: 35.3 g/dL (ref 30.0–36.0)
MCV: 91.7 fl (ref 78.0–100.0)
MONOS PCT: 7.7 % (ref 3.0–12.0)
Monocytes Absolute: 0.3 10*3/uL (ref 0.1–1.0)
Neutro Abs: 2.5 10*3/uL (ref 1.4–7.7)
Neutrophils Relative %: 54.6 % (ref 43.0–77.0)
PLATELETS: 186 10*3/uL (ref 150.0–400.0)
RBC: 4.84 Mil/uL (ref 4.22–5.81)
RDW: 12.1 % (ref 11.5–15.5)
WBC: 4.5 10*3/uL (ref 4.0–10.5)

## 2015-02-20 LAB — URINALYSIS, ROUTINE W REFLEX MICROSCOPIC
Bilirubin Urine: NEGATIVE
HGB URINE DIPSTICK: NEGATIVE
Ketones, ur: NEGATIVE
Leukocytes, UA: NEGATIVE
Nitrite: NEGATIVE
PH: 6 (ref 5.0–8.0)
Specific Gravity, Urine: 1.025 (ref 1.000–1.030)
Total Protein, Urine: NEGATIVE
Urine Glucose: NEGATIVE
Urobilinogen, UA: 0.2 (ref 0.0–1.0)

## 2015-02-20 LAB — LIPID PANEL
CHOLESTEROL: 110 mg/dL (ref 0–200)
HDL: 34.5 mg/dL — AB (ref >39.00)
LDL Cholesterol: 60 mg/dL (ref 0–99)
NonHDL: 75.5
TRIGLYCERIDES: 80 mg/dL (ref 0.0–149.0)
Total CHOL/HDL Ratio: 3
VLDL: 16 mg/dL (ref 0.0–40.0)

## 2015-02-20 LAB — HEPATIC FUNCTION PANEL
ALT: 17 U/L (ref 0–53)
AST: 20 U/L (ref 0–37)
Albumin: 4.3 g/dL (ref 3.5–5.2)
Alkaline Phosphatase: 35 U/L — ABNORMAL LOW (ref 39–117)
Bilirubin, Direct: 0.2 mg/dL (ref 0.0–0.3)
Total Bilirubin: 1 mg/dL (ref 0.2–1.2)
Total Protein: 6.7 g/dL (ref 6.0–8.3)

## 2015-02-20 LAB — PSA: PSA: 0.65 ng/mL (ref 0.10–4.00)

## 2015-02-20 LAB — HEMOGLOBIN A1C: HEMOGLOBIN A1C: 5.1 % (ref 4.6–6.5)

## 2015-02-20 LAB — TSH: TSH: 1.01 u[IU]/mL (ref 0.35–4.50)

## 2015-02-21 ENCOUNTER — Encounter: Payer: Self-pay | Admitting: Internal Medicine

## 2015-02-21 ENCOUNTER — Ambulatory Visit (INDEPENDENT_AMBULATORY_CARE_PROVIDER_SITE_OTHER): Payer: BLUE CROSS/BLUE SHIELD | Admitting: Internal Medicine

## 2015-02-21 VITALS — BP 142/88 | HR 65 | Temp 98.1°F | Resp 18 | Wt 219.1 lb

## 2015-02-21 DIAGNOSIS — Z0000 Encounter for general adult medical examination without abnormal findings: Secondary | ICD-10-CM | POA: Diagnosis not present

## 2015-02-21 DIAGNOSIS — R7302 Impaired glucose tolerance (oral): Secondary | ICD-10-CM

## 2015-02-21 NOTE — Progress Notes (Signed)
Pre visit review using our clinic review tool, if applicable. No additional management support is needed unless otherwise documented below in the visit note. 

## 2015-02-21 NOTE — Assessment & Plan Note (Signed)
stable overall by history and exam, recent data reviewed with pt, and pt to continue medical treatment as before,  to f/u any worsening symptoms or concerns Lab Results  Component Value Date   HGBA1C 5.1 02/20/2015

## 2015-02-21 NOTE — Assessment & Plan Note (Addendum)

## 2015-02-21 NOTE — Patient Instructions (Signed)
Please continue all other medications as before, and refills have been done if requested.  Please have the pharmacy call with any other refills you may need.  Please continue your efforts at being more active, low cholesterol diet, and weight control.  You are otherwise up to date with prevention measures today.  Please keep your appointments with your specialists as you may have planned  Please return in 1 year for your yearly visit, or sooner if needed, with Lab testing done 3-5 days before  

## 2015-02-21 NOTE — Progress Notes (Signed)
Subjective:    Patient ID: [name], male    DOB: 07/23/1966, 49 y.o.   MRN: [id number]  HPI  Here for wellness and f/u;  Overall doing ok;  Pt denies Chest pain, worsening SOB, DOE, wheezing, orthopnea, PND, worsening LE edema, palpitations, dizziness or syncope.  Pt denies neurological change such as new headache, facial or extremity weakness.  Pt denies polydipsia, polyuria, or low sugar symptoms. Pt states overall good compliance with treatment and medications, good tolerability, and has been trying to follow appropriate diet.  Pt denies worsening depressive symptoms, suicidal ideation or panic. No fever, night sweats, wt loss, loss of appetite, or other constitutional symptoms.  Pt states good ability with ADL's, has low fall risk, home safety reviewed and adequate, no other significant changes in hearing or vision, and only occasionally active with exercise.  Sees cardiology on regular basis, due for PPM change in the next few yrs. Past Medical History  Diagnosis Date  . Hyperlipidemia   . Hypertension   . GERD (gastroesophageal reflux disease)   . Depression   . Lumbar disc disease   . Glucose intolerance (impaired glucose tolerance)   . Atrial fibrillation   . Syncope   . Hiatal hernia   . Low back pain   . H/O: Bell's palsy   . CAD, NATIVE VESSEL 09/11/2009  . DEPRESSION   . DISC DISEASE, LUMBAR   . HYPERSOMNIA, ASSOCIATED WITH SLEEP APNEA   . LIBIDO, DECREASED   . Pacemaker-st judes 2012  . PULMONARY NODULE 05/03/2008  . Sinoatrial node dysfunction   . Atrial lead impedance-intermittently low 05/30/2012   Past Surgical History  Procedure Laterality Date  . Cardiac pacemaker placement      [facility] [phone]  . Lumbar laminectomy      reports that he quit smoking about 7 years ago. He does not have any smokeless tobacco history on file. He reports that he does not drink alcohol or use illicit drugs. family history includes Coronary artery disease in his other;  Diabetes in his other; Hyperlipidemia in his other; Hypertension in his other. Allergies  Allergen Reactions  . Lisinopril     REACTION: cough   Current Outpatient Prescriptions on File Prior to Visit  Medication Sig Dispense Refill  . aspirin 81 MG tablet Take 81 mg by mouth daily.    . clopidogrel (PLAVIX) 75 MG tablet Take 1 tablet (75 mg total) by mouth daily. 30 tablet 6  . losartan (COZAAR) 25 MG tablet Take 1 tablet (25 mg total) by mouth daily. 90 tablet 1  . metoprolol tartrate (LOPRESSOR) 25 MG tablet TAKE ONE TABLET BY MOUTH TWICE DAILY 180 tablet 1  . nitroGLYCERIN (NITROSTAT) 0.4 MG SL tablet Place 1 tablet (0.4 mg total) under the tongue every 5 (five) minutes as needed. 25 tablet 3  . rosuvastatin (CRESTOR) 40 MG tablet Take 1 tablet (40 mg total) by mouth daily. 30 tablet 6   No current facility-administered medications on file prior to visit.    Review of Systems Constitutional: Negative for increased diaphoresis, other activity, appetite or siginficant weight change other than noted HENT: Negative for worsening hearing loss, ear pain, facial swelling, mouth sores and neck stiffness.   Eyes: Negative for other worsening pain, redness or visual disturbance.  Respiratory: Negative for shortness of breath and wheezing  Cardiovascular: Negative for chest pain and palpitations.  Gastrointestinal: Negative for diarrhea, blood in stool, abdominal distention or other pain Genitourinary: Negative for hematuria, flank  pain or change in urine volume.  Musculoskeletal: Negative for myalgias or other joint complaints.  Skin: Negative for color change and wound or drainage.  Neurological: Negative for syncope and numbness. other than noted Hematological: Negative for adenopathy. or other swelling Psychiatric/Behavioral: Negative for hallucinations, SI, self-injury, decreased concentration or other worsening agitation.      Objective:   Physical Exam BP 142/88 mmHg  Pulse 65   Temp(Src) 98.1 F (36.7 C) (Oral)  Resp 18  Wt 219 lb 1.3 oz (99.374 kg)  SpO2 96% VS noted,  Constitutional: Pt is oriented to person, place, and time. Appears well-developed and well-nourished, in no significant distress Head: Normocephalic and atraumatic.  Right Ear: External ear normal.  Left Ear: External ear normal.  Nose: Nose normal.  Mouth/Throat: Oropharynx is clear and moist.  Eyes: Conjunctivae and EOM are normal. Pupils are equal, round, and reactive to light.  Neck: Normal range of motion. Neck supple. No JVD present. No tracheal deviation present or significant neck LA or mass Cardiovascular: Normal rate, regular rhythm, normal heart sounds and intact distal pulses.   Pulmonary/Chest: Effort normal and breath sounds without rales or wheezing  Abdominal: Soft. Bowel sounds are normal. NT. No HSM  Musculoskeletal: Normal range of motion. Exhibits no edema.  Lymphadenopathy:  Has no cervical adenopathy.  Neurological: Pt is alert and oriented to person, place, and time. Pt has normal reflexes. No cranial nerve deficit. Motor grossly intact Skin: Skin is warm and dry. No rash noted.  Psychiatric:  Has normal mood and affect. Behavior is normal.      Assessment & Plan:

## 2015-03-04 ENCOUNTER — Encounter: Payer: Self-pay | Admitting: Internal Medicine

## 2015-03-05 ENCOUNTER — Other Ambulatory Visit: Payer: Self-pay

## 2015-03-05 MED ORDER — LOSARTAN POTASSIUM 25 MG PO TABS: 25.0000 mg | ORAL_TABLET | Freq: Every day | ORAL | Status: DC

## 2015-06-07 ENCOUNTER — Telehealth: Payer: Self-pay | Admitting: *Deleted

## 2015-06-07 NOTE — Telephone Encounter (Signed)
called for fm status, unable to reach pt.. 

## 2015-06-11 ENCOUNTER — Encounter: Payer: Self-pay | Admitting: Internal Medicine

## 2015-06-11 ENCOUNTER — Ambulatory Visit (INDEPENDENT_AMBULATORY_CARE_PROVIDER_SITE_OTHER): Payer: BLUE CROSS/BLUE SHIELD | Admitting: Internal Medicine

## 2015-06-11 VITALS — BP 100/72 | HR 61 | Ht 71.0 in | Wt 210.8 lb

## 2015-06-11 DIAGNOSIS — I480 Paroxysmal atrial fibrillation: Secondary | ICD-10-CM

## 2015-06-11 DIAGNOSIS — Z45018 Encounter for adjustment and management of other part of cardiac pacemaker: Secondary | ICD-10-CM | POA: Diagnosis not present

## 2015-06-11 DIAGNOSIS — I495 Sick sinus syndrome: Secondary | ICD-10-CM

## 2015-06-11 LAB — CUP PACEART INCLINIC DEVICE CHECK
Battery Impedance: 4300 Ohm
Brady Statistic RA Percent Paced: 84 %
Brady Statistic RV Percent Paced: 41 %
Date Time Interrogation Session: 20160628165024
Lead Channel Impedance Value: 549 Ohm
Lead Channel Pacing Threshold Amplitude: 1.25 V
Lead Channel Pacing Threshold Pulse Width: 0.4 ms
Lead Channel Sensing Intrinsic Amplitude: 12 mV
Lead Channel Setting Pacing Amplitude: 2 V
Lead Channel Setting Sensing Sensitivity: 2.5 mV
MDC IDC MSMT BATTERY VOLTAGE: 2.74 V
MDC IDC MSMT LEADCHNL RA IMPEDANCE VALUE: 211 Ohm
MDC IDC MSMT LEADCHNL RA PACING THRESHOLD AMPLITUDE: 1 V
MDC IDC MSMT LEADCHNL RA PACING THRESHOLD PULSEWIDTH: 0.6 ms
MDC IDC MSMT LEADCHNL RA SENSING INTR AMPL: 2.1 mV
MDC IDC SET LEADCHNL RV PACING PULSEWIDTH: 0.4 ms
Pulse Gen Model: 5826
Pulse Gen Serial Number: 1934644

## 2015-06-11 NOTE — Patient Instructions (Addendum)
Medication Instructions:  Your physician recommends that you continue on your current medications as directed. Please refer to the Current Medication list given to you today.  Labwork: None ordered  Testing/Procedures: None ordered  Follow-Up: Your physician wants you to follow-up in: 6 months with device clinic.  You will receive a reminder letter in the mail two months in advance. If you don't receive a letter, please call our office to schedule the follow-up appointment.  Your physician wants you to follow-up in: 1 year with Dr. Graciela HusbandsKlein.  You will receive a reminder letter in the mail two months in advance. If you don't receive a letter, please call our office to schedule the follow-up appointment.  Thank you for choosing [facility]!!

## 2015-06-11 NOTE — Progress Notes (Signed)
      Patient Care Team: Corwin LevinsJames W [name], MD as PCP - General   HPI  Derrick CortiJames D Solomon is a 49 y.o. male seen in followup for syncope associated with bradycardia and sinus node dysfunction his paroxysmal atrial fibrillation. He is status post pacemaker implantation.  He also has a history of coronary disease status post drug-eluting stenting to his right coronary artery with near normal left ventricular function.  He denies problems with chest pain or shortness of breath. His exercise tolerance is quite good.   He has lost 10 lbs  better. Blood pressure is much lower   Past Medical History  Diagnosis Date  . Hyperlipidemia   . Hypertension   . GERD (gastroesophageal reflux disease)   . Depression   . Lumbar disc disease   . Glucose intolerance (impaired glucose tolerance)   . Atrial fibrillation   . Syncope   . Hiatal hernia   . Low back pain   . H/O: Bell's palsy   . CAD, NATIVE VESSEL 09/11/2009  . DEPRESSION   . DISC DISEASE, LUMBAR   . HYPERSOMNIA, ASSOCIATED WITH SLEEP APNEA   . LIBIDO, DECREASED   . Pacemaker-st judes 2012  . PULMONARY NODULE 05/03/2008  . Sinoatrial node dysfunction   . Atrial lead impedance-intermittently low 05/30/2012    Past Surgical History  Procedure Laterality Date  . Cardiac pacemaker placement      [facility] Jude Zephyr [phone]  . Lumbar laminectomy      Current Outpatient Prescriptions  Medication Sig Dispense Refill  . aspirin 81 MG tablet Take 81 mg by mouth daily.    . clopidogrel (PLAVIX) 75 MG tablet Take 1 tablet (75 mg total) by mouth daily. 30 tablet 6  . losartan (COZAAR) 25 MG tablet Take 1 tablet (25 mg total) by mouth daily. 90 tablet 1  . metoprolol tartrate (LOPRESSOR) 25 MG tablet TAKE ONE TABLET BY MOUTH TWICE DAILY 180 tablet 1  . nitroGLYCERIN (NITROSTAT) 0.4 MG SL tablet Place 0.4 mg under the tongue every 5 (five) minutes as needed for chest pain (MAX 3 TABLETS).    . rosuvastatin (CRESTOR) 40 MG tablet Take 1 tablet (40 mg  total) by mouth daily. 30 tablet 6   No current facility-administered medications for this visit.    Allergies  Allergen Reactions  . Lisinopril     REACTION: cough    Review of Systems negative except from HPI and PMH  Physical Exam BP 100/72 mmHg  Pulse 61  Ht 5\' 11"  (1.803 m)  Wt 210 lb 12.8 oz (95.618 kg)  BMI 29.41 kg/m2 Well developed and well nourished in no acute distress HENT normal E scleral and icterus clear Neck Supple JVP flat; carotids brisk and full Clear to ausculation regular rate and rhythm, no murmurs gallops or rub Soft with active bowel sounds No clubbing cyanosis  Edema Alert and oriented, grossly normal motor and sensory function Skin Warm and Dry  ECG demonstrates atrial pacing. Inferior wall MI. Intervals 20/08/36 Low-voltage  Assessment and  Plan  Sinus node dysfunction  Atrial fibrillation-paroxysmal  Coronary artery disease with prior DES stenting  Pacemaker-St. Jude The patient's device was interrogated and the information was fully reviewed.  The device was reprogrammed to  Unipolar pacing  Atrial impedance-low  Heart rate excursion is adequate with atrial pacing  Without symptoms of ischemia

## 2015-06-20 ENCOUNTER — Other Ambulatory Visit: Payer: Self-pay | Admitting: Internal Medicine

## 2015-06-20 ENCOUNTER — Encounter: Payer: Self-pay | Admitting: Internal Medicine

## 2015-06-21 ENCOUNTER — Other Ambulatory Visit: Payer: Self-pay

## 2015-06-21 MED ORDER — METOPROLOL TARTRATE 25 MG PO TABS: ORAL_TABLET | ORAL | Status: DC

## 2015-06-24 ENCOUNTER — Encounter: Payer: Self-pay | Admitting: Internal Medicine

## 2015-08-05 ENCOUNTER — Other Ambulatory Visit: Payer: Self-pay | Admitting: Internal Medicine

## 2015-09-01 ENCOUNTER — Other Ambulatory Visit: Payer: Self-pay | Admitting: Internal Medicine

## 2015-09-03 ENCOUNTER — Encounter: Payer: Self-pay | Admitting: Internal Medicine

## 2015-09-03 ENCOUNTER — Other Ambulatory Visit: Payer: Self-pay

## 2015-09-03 MED ORDER — ROSUVASTATIN CALCIUM 40 MG PO TABS: 40.0000 mg | ORAL_TABLET | Freq: Every day | ORAL | Status: DC

## 2015-09-03 NOTE — Telephone Encounter (Signed)
Please advise on refill as it looks like pcp manages lipids. Thanks, MI

## 2015-09-04 NOTE — Telephone Encounter (Signed)
I can't see what the refill is for, but if for Crestor, then it would be appropriate for PCP to refill statin. Last labs were ordered by Dr. Jonny Ruiz and the patient has no general cardiologist. Thanks!

## 2015-12-12 ENCOUNTER — Ambulatory Visit (INDEPENDENT_AMBULATORY_CARE_PROVIDER_SITE_OTHER): Payer: BLUE CROSS/BLUE SHIELD | Admitting: *Deleted

## 2015-12-12 DIAGNOSIS — Z950 Presence of cardiac pacemaker: Secondary | ICD-10-CM | POA: Diagnosis not present

## 2015-12-12 DIAGNOSIS — I495 Sick sinus syndrome: Secondary | ICD-10-CM

## 2015-12-12 LAB — CUP PACEART INCLINIC DEVICE CHECK
Battery Impedance: 7700 Ohm
Brady Statistic RA Percent Paced: 80 %
Implantable Lead Implant Date: 20080909
Implantable Lead Location: 753859
Implantable Lead Location: 753860
Lead Channel Impedance Value: 544 Ohm
Lead Channel Sensing Intrinsic Amplitude: 12 mV
Lead Channel Sensing Intrinsic Amplitude: 2.7 mV
Lead Channel Setting Pacing Amplitude: 2 V
MDC IDC LEAD IMPLANT DT: 20080909
MDC IDC MSMT BATTERY VOLTAGE: 2.72 V
MDC IDC MSMT LEADCHNL RA IMPEDANCE VALUE: 289 Ohm
MDC IDC MSMT LEADCHNL RA PACING THRESHOLD AMPLITUDE: 0.5 V
MDC IDC MSMT LEADCHNL RA PACING THRESHOLD PULSEWIDTH: 0.6 ms
MDC IDC MSMT LEADCHNL RV PACING THRESHOLD AMPLITUDE: 1.125 V
MDC IDC MSMT LEADCHNL RV PACING THRESHOLD PULSEWIDTH: 0.4 ms
MDC IDC SESS DTM: 20161229174058
MDC IDC SET LEADCHNL RV PACING PULSEWIDTH: 0.4 ms
MDC IDC SET LEADCHNL RV SENSING SENSITIVITY: 2.5 mV
MDC IDC STAT BRADY RV PERCENT PACED: 34 %
Pulse Gen Serial Number: 1934644

## 2015-12-12 NOTE — Progress Notes (Signed)
Pacemaker check in clinic. Normal device function. Thresholds, sensing, consistent with previous measurements. RV impedance consistent. RA impedance initially <200 ohms but remeasured at 289 ohms twice. Device programmed to maximize longevity. 2,561 mode switches, longest  1hr11021min @404bpm  + ASA & plavix. Device programmed at appropriate safety margins. Histogram distribution appropriate for patient activity level. Device programmed to optimize intrinsic conduction. Estimated longevity 0.75-1.3475yr. ROV w/ device clinic 02/12/16, next billable Apr.

## 2015-12-27 ENCOUNTER — Telehealth: Payer: Self-pay | Admitting: Internal Medicine

## 2015-12-27 ENCOUNTER — Encounter: Payer: Self-pay | Admitting: Internal Medicine

## 2015-12-27 NOTE — Telephone Encounter (Signed)
Physical paper received. [facility] 12/[facility]/15/KM

## 2015-12-30 ENCOUNTER — Telehealth: Payer: Self-pay | Admitting: Internal Medicine

## 2015-12-30 NOTE — Telephone Encounter (Signed)
New message      Talk to the nurse.  Pt is starting a new job soon.  Have questions about what he can be around and what he can do since he has a pacemaker.  Please call tuesday

## 2015-12-30 NOTE — Telephone Encounter (Signed)
Will forward to Dr. Klein for review. 

## 2015-12-31 ENCOUNTER — Other Ambulatory Visit: Payer: Self-pay | Admitting: Internal Medicine

## 2015-12-31 NOTE — Telephone Encounter (Signed)
LMOM requesting call back.  Gave device clinic phone number. 

## 2016-01-01 ENCOUNTER — Encounter: Payer: Self-pay | Admitting: Internal Medicine

## 2016-01-02 NOTE — Telephone Encounter (Signed)
I called and spoke with the patient's wife. She did receive Emily's message from the other day and states she did try to call back. I advised her the form has been signed for [facility] for the patient. This has been faxed back to Mrs. Ergle at [phone].

## 2016-01-02 NOTE — Telephone Encounter (Signed)
F/u  Pt wife calling to followup on paperwork that was to be  Signed by Dr Graciela Husbands- pt requested to speak w/ RN today. Please call back and discuss.

## 2016-02-12 ENCOUNTER — Ambulatory Visit (INDEPENDENT_AMBULATORY_CARE_PROVIDER_SITE_OTHER): Payer: BLUE CROSS/BLUE SHIELD | Admitting: *Deleted

## 2016-02-12 ENCOUNTER — Encounter: Payer: Self-pay | Admitting: Internal Medicine

## 2016-02-12 DIAGNOSIS — I495 Sick sinus syndrome: Secondary | ICD-10-CM

## 2016-02-12 LAB — CUP PACEART INCLINIC DEVICE CHECK
Brady Statistic RA Percent Paced: 75 %
Brady Statistic RV Percent Paced: 27 %
Date Time Interrogation Session: 20170301164241
Implantable Lead Location: 753859
Lead Channel Setting Pacing Pulse Width: 0.4 ms
Lead Channel Setting Sensing Sensitivity: 2.5 mV
MDC IDC LEAD IMPLANT DT: 20080909
MDC IDC LEAD IMPLANT DT: 20080909
MDC IDC LEAD LOCATION: 753860
MDC IDC MSMT BATTERY IMPEDANCE: 10300 Ohm
MDC IDC MSMT BATTERY REMAINING LONGEVITY: 9
MDC IDC MSMT BATTERY VOLTAGE: 2.65 V
MDC IDC MSMT LEADCHNL RA IMPEDANCE VALUE: 306 Ohm
MDC IDC MSMT LEADCHNL RV IMPEDANCE VALUE: 542 Ohm
MDC IDC SET LEADCHNL RA PACING AMPLITUDE: 2 V
Pulse Gen Serial Number: 1934644

## 2016-02-12 NOTE — Progress Notes (Signed)
Pacemaker check in clinic. For battery only. Estimated longevity 0.89yrs. ROV with device clinic in 60mo and with SK on 05/2016. Patient education completed.

## 2016-02-25 ENCOUNTER — Ambulatory Visit (INDEPENDENT_AMBULATORY_CARE_PROVIDER_SITE_OTHER): Payer: BLUE CROSS/BLUE SHIELD | Admitting: Internal Medicine

## 2016-02-25 ENCOUNTER — Encounter: Payer: Self-pay | Admitting: Internal Medicine

## 2016-02-25 ENCOUNTER — Other Ambulatory Visit (INDEPENDENT_AMBULATORY_CARE_PROVIDER_SITE_OTHER): Payer: BLUE CROSS/BLUE SHIELD

## 2016-02-25 VITALS — BP 120/70 | HR 82 | Temp 98.3°F | Resp 20 | Wt 214.0 lb

## 2016-02-25 DIAGNOSIS — R7302 Impaired glucose tolerance (oral): Secondary | ICD-10-CM

## 2016-02-25 DIAGNOSIS — Z0000 Encounter for general adult medical examination without abnormal findings: Secondary | ICD-10-CM | POA: Diagnosis not present

## 2016-02-25 DIAGNOSIS — F329 Major depressive disorder, single episode, unspecified: Secondary | ICD-10-CM

## 2016-02-25 DIAGNOSIS — I10 Essential (primary) hypertension: Secondary | ICD-10-CM

## 2016-02-25 DIAGNOSIS — E785 Hyperlipidemia, unspecified: Secondary | ICD-10-CM | POA: Diagnosis not present

## 2016-02-25 DIAGNOSIS — F32A Depression, unspecified: Secondary | ICD-10-CM

## 2016-02-25 LAB — HEPATIC FUNCTION PANEL
ALK PHOS: 34 U/L — AB (ref 39–117)
ALT: 20 U/L (ref 0–53)
AST: 20 U/L (ref 0–37)
Albumin: 4.6 g/dL (ref 3.5–5.2)
BILIRUBIN DIRECT: 0.2 mg/dL (ref 0.0–0.3)
BILIRUBIN TOTAL: 1 mg/dL (ref 0.2–1.2)
TOTAL PROTEIN: 7.1 g/dL (ref 6.0–8.3)

## 2016-02-25 LAB — LIPID PANEL
CHOL/HDL RATIO: 4
Cholesterol: 132 mg/dL (ref 0–200)
HDL: 36.1 mg/dL — ABNORMAL LOW (ref >39.00)
LDL Cholesterol: 70 mg/dL (ref 0–99)
NONHDL: 96.35
Triglycerides: 132 mg/dL (ref 0.0–149.0)
VLDL: 26.4 mg/dL (ref 0.0–40.0)

## 2016-02-25 LAB — CBC WITH DIFFERENTIAL/PLATELET
BASOS ABS: 0.1 10*3/uL (ref 0.0–0.1)
BASOS PCT: 1 % (ref 0.0–3.0)
EOS ABS: 0.2 10*3/uL (ref 0.0–0.7)
Eosinophils Relative: 2.9 % (ref 0.0–5.0)
HEMATOCRIT: 46.7 % (ref 39.0–52.0)
Hemoglobin: 16.3 g/dL (ref 13.0–17.0)
LYMPHS ABS: 1.8 10*3/uL (ref 0.7–4.0)
LYMPHS PCT: 28.4 % (ref 12.0–46.0)
MCHC: 34.9 g/dL (ref 30.0–36.0)
MCV: 91.4 fl (ref 78.0–100.0)
MONOS PCT: 8.6 % (ref 3.0–12.0)
Monocytes Absolute: 0.5 10*3/uL (ref 0.1–1.0)
NEUTROS ABS: 3.7 10*3/uL (ref 1.4–7.7)
NEUTROS PCT: 59.1 % (ref 43.0–77.0)
PLATELETS: 237 10*3/uL (ref 150.0–400.0)
RBC: 5.11 Mil/uL (ref 4.22–5.81)
RDW: 12.7 % (ref 11.5–15.5)
WBC: 6.2 10*3/uL (ref 4.0–10.5)

## 2016-02-25 LAB — BASIC METABOLIC PANEL
BUN: 19 mg/dL (ref 6–23)
CHLORIDE: 107 meq/L (ref 96–112)
CO2: 26 meq/L (ref 19–32)
Calcium: 9.7 mg/dL (ref 8.4–10.5)
Creatinine, Ser: 1.05 mg/dL (ref 0.40–1.50)
GFR: 78.94 mL/min (ref >60.00)
Glucose, Bld: 96 mg/dL (ref 70–99)
POTASSIUM: 4.7 meq/L (ref 3.5–5.1)
SODIUM: 142 meq/L (ref 135–145)

## 2016-02-25 LAB — TSH: TSH: 0.53 u[IU]/mL (ref 0.35–4.50)

## 2016-02-25 LAB — HEMOGLOBIN A1C: HEMOGLOBIN A1C: 5.2 % (ref 4.6–6.5)

## 2016-02-25 LAB — PSA: PSA: 0.7 ng/mL (ref 0.10–4.00)

## 2016-02-25 NOTE — Assessment & Plan Note (Signed)
stable overall by history and exam, recent data reviewed with pt, and pt to continue medical treatment as before,  to f/u any worsening symptoms or concerns Lab Results  Component Value Date   LDLCALC 60 02/20/2015

## 2016-02-25 NOTE — Assessment & Plan Note (Signed)
Asympt, stable overall by history and exam, recent data reviewed with pt, and pt to continue medical treatment as before,  to f/u any worsening symptoms or concerns Lab Results  Component Value Date   HGBA1C 5.1 02/20/2015

## 2016-02-25 NOTE — Progress Notes (Signed)
Subjective:    Patient ID: [name], male    DOB: 09-28-1966, 50 y.o.   MRN: [id number]  HPI  Here for wellness and f/u;  Overall doing ok;  Pt denies Chest pain, worsening SOB, DOE, wheezing, orthopnea, PND, worsening LE edema, palpitations, dizziness or syncope.  Pt denies neurological change such as new headache, facial or extremity weakness.  Pt denies polydipsia, polyuria, or low sugar symptoms. Pt states overall good compliance with treatment and medications, good tolerability, and has been trying to follow appropriate diet.  Pt denies worsening depressive symptoms, suicidal ideation or panic. No fever, night sweats, wt loss, loss of appetite, or other constitutional symptoms.  Pt states good ability with ADL's, has low fall risk, home safety reviewed and adequate, no other significant changes in hearing or vision, and only occasionally active with exercise.  No current complaints Past Medical History  Diagnosis Date  . Hyperlipidemia   . Hypertension   . GERD (gastroesophageal reflux disease)   . Depression   . Lumbar disc disease   . Glucose intolerance (impaired glucose tolerance)   . Atrial fibrillation (HCC)   . Syncope   . Hiatal hernia   . Low back pain   . H/O: Bell's palsy   . CAD, NATIVE VESSEL 09/11/2009  . DEPRESSION   . DISC DISEASE, LUMBAR   . HYPERSOMNIA, ASSOCIATED WITH SLEEP APNEA   . LIBIDO, DECREASED   . Pacemaker-st judes 2012  . PULMONARY NODULE 05/03/2008  . Sinoatrial node dysfunction (HCC)   . Atrial lead impedance-intermittently low 05/30/2012   Past Surgical History  Procedure Laterality Date  . Cardiac pacemaker placement      [facility] [phone]  . Lumbar laminectomy      reports that he quit smoking about 8 years ago. He does not have any smokeless tobacco history on file. He reports that he does not drink alcohol or use illicit drugs. family history includes Coronary artery disease in his other; Diabetes in his maternal grandfather, maternal  grandmother, and other; Heart attack in his father and paternal grandfather; Hyperlipidemia in his other; Hypertension in his other. Allergies  Allergen Reactions  . Lisinopril     REACTION: cough   Current Outpatient Prescriptions on File Prior to Visit  Medication Sig Dispense Refill  . aspirin 81 MG tablet Take 81 mg by mouth daily.    . clopidogrel (PLAVIX) 75 MG tablet TAKE ONE TABLET BY MOUTH ONCE DAILY 30 tablet 5  . losartan (COZAAR) 25 MG tablet TAKE ONE TABLET BY MOUTH ONCE DAILY 90 tablet 1  . metoprolol tartrate (LOPRESSOR) 25 MG tablet TAKE ONE TABLET BY MOUTH TWICE DAILY 180 tablet 3  . nitroGLYCERIN (NITROSTAT) 0.4 MG SL tablet Place 0.4 mg under the tongue every 5 (five) minutes as needed for chest pain (MAX 3 TABLETS).    . rosuvastatin (CRESTOR) 40 MG tablet Take 1 tablet (40 mg total) by mouth daily. 30 tablet 6   No current facility-administered medications on file prior to visit.   Review of Systems Constitutional: Negative for increased diaphoresis, other activity, appetite or siginficant weight change other than noted HENT: Negative for worsening hearing loss, ear pain, facial swelling, mouth sores and neck stiffness.   Eyes: Negative for other worsening pain, redness or visual disturbance.  Respiratory: Negative for shortness of breath and wheezing  Cardiovascular: Negative for chest pain and palpitations.  Gastrointestinal: Negative for diarrhea, blood in stool, abdominal distention or other pain Genitourinary: Negative for hematuria,  flank pain or change in urine volume.  Musculoskeletal: Negative for myalgias or other joint complaints.  Skin: Negative for color change and wound or drainage.  Neurological: Negative for syncope and numbness. other than noted Hematological: Negative for adenopathy. or other swelling Psychiatric/Behavioral: Negative for hallucinations, SI, self-injury, decreased concentration or other worsening agitation.      Objective:    Physical Exam BP 120/70 mmHg  Pulse 82  Temp(Src) 98.3 F (36.8 C) (Oral)  Resp 20  Wt 214 lb (97.07 kg)  SpO2 96% VS noted,  Constitutional: Pt is oriented to person, place, and time. Appears well-developed and well-nourished, in no significant distress Head: Normocephalic and atraumatic.  Right Ear: External ear normal.  Left Ear: External ear normal.  Nose: Nose normal.  Mouth/Throat: Oropharynx is clear and moist.  Eyes: Conjunctivae and EOM are normal. Pupils are equal, round, and reactive to light.  Neck: Normal range of motion. Neck supple. No JVD present. No tracheal deviation present or significant neck LA or mass Cardiovascular: Normal rate, regular rhythm, normal heart sounds and intact distal pulses.   Pulmonary/Chest: Effort normal and breath sounds without rales or wheezing  Abdominal: Soft. Bowel sounds are normal. NT. No HSM  Musculoskeletal: Normal range of motion. Exhibits no edema.  Lymphadenopathy:  Has no cervical adenopathy.  Neurological: Pt is alert and oriented to person, place, and time. Pt has normal reflexes. No cranial nerve deficit. Motor grossly intact Skin: Skin is warm and dry. No rash noted.  Psychiatric:  Has normal mood and affect. Behavior is normal.     Assessment & Plan:

## 2016-02-25 NOTE — Assessment & Plan Note (Signed)
stable overall by history and exam, and pt to continue medical treatment as before,  to f/u any worsening symptoms or concerns 

## 2016-02-25 NOTE — Assessment & Plan Note (Signed)

## 2016-02-25 NOTE — Patient Instructions (Signed)
Please continue all other medications as before, and refills have been done if requested.  Please have the pharmacy call with any other refills you may need.  Please continue your efforts at being more active, low cholesterol diet, and weight control.  You are otherwise up to date with prevention measures today.  Please keep your appointments with your specialists as you may have planned  Please return in 1 year for your yearly visit, or sooner if needed, with Lab testing done 3-5 days before  

## 2016-02-25 NOTE — Progress Notes (Signed)
Pre visit review using our clinic review tool, if applicable. No additional management support is needed unless otherwise documented below in the visit note. 

## 2016-02-25 NOTE — Assessment & Plan Note (Signed)
stable overall by history and exam, recent data reviewed with pt, and pt to continue medical treatment as before,  to f/u any worsening symptoms or concerns BP Readings from Last 3 Encounters:  02/25/16 120/70  06/11/15 100/72  02/21/15 142/88

## 2016-02-26 LAB — HEPATITIS C ANTIBODY: HCV AB: NEGATIVE

## 2016-03-02 ENCOUNTER — Other Ambulatory Visit: Payer: Self-pay | Admitting: Internal Medicine

## 2016-04-03 ENCOUNTER — Other Ambulatory Visit: Payer: Self-pay | Admitting: Internal Medicine

## 2016-04-06 ENCOUNTER — Other Ambulatory Visit: Payer: Self-pay

## 2016-04-06 ENCOUNTER — Encounter: Payer: Self-pay | Admitting: Internal Medicine

## 2016-04-06 NOTE — Telephone Encounter (Signed)
Lipid panel by pcp. Ok to refill or defer to their office? Please advise. Thanks, MI

## 2016-04-07 ENCOUNTER — Telehealth: Payer: Self-pay

## 2016-04-07 MED ORDER — ROSUVASTATIN CALCIUM 40 MG PO TABS: 40.0000 mg | ORAL_TABLET | Freq: Every day | ORAL | Status: DC

## 2016-04-07 NOTE — Telephone Encounter (Signed)
Would defer to PCP if they are following his lipid panel. Thanks!

## 2016-04-07 NOTE — Telephone Encounter (Signed)
90day supply sent.

## 2016-04-07 NOTE — Telephone Encounter (Signed)
Send to PCP please!  Thanks!!

## 2016-04-09 ENCOUNTER — Ambulatory Visit (INDEPENDENT_AMBULATORY_CARE_PROVIDER_SITE_OTHER): Payer: BLUE CROSS/BLUE SHIELD | Admitting: *Deleted

## 2016-04-09 ENCOUNTER — Encounter: Payer: Self-pay | Admitting: Internal Medicine

## 2016-04-09 DIAGNOSIS — I495 Sick sinus syndrome: Secondary | ICD-10-CM

## 2016-04-09 LAB — CUP PACEART INCLINIC DEVICE CHECK
Battery Remaining Longevity: 6
Battery Voltage: 2.66 V
Date Time Interrogation Session: 20170427165930
Implantable Lead Implant Date: 20080909
Implantable Lead Location: 753860
Lead Channel Impedance Value: 247 Ohm
Lead Channel Impedance Value: 497 Ohm
Lead Channel Pacing Threshold Amplitude: 1.25 V
Lead Channel Pacing Threshold Pulse Width: 0.4 ms
Lead Channel Setting Pacing Amplitude: 2 V
Lead Channel Setting Pacing Amplitude: 2.25 V
Lead Channel Setting Pacing Pulse Width: 0.4 ms
Lead Channel Setting Sensing Sensitivity: 2.5 mV
MDC IDC LEAD IMPLANT DT: 20080909
MDC IDC LEAD LOCATION: 753859
MDC IDC MSMT BATTERY IMPEDANCE: 15700 Ohm
MDC IDC MSMT LEADCHNL RA PACING THRESHOLD AMPLITUDE: 0.5 V
MDC IDC MSMT LEADCHNL RA PACING THRESHOLD PULSEWIDTH: 0.6 ms
MDC IDC MSMT LEADCHNL RA SENSING INTR AMPL: 2.5 mV
MDC IDC MSMT LEADCHNL RV SENSING INTR AMPL: 12 mV
MDC IDC PG SERIAL: 1934644
MDC IDC STAT BRADY RA PERCENT PACED: 77 %
MDC IDC STAT BRADY RV PERCENT PACED: 29 %

## 2016-04-09 NOTE — Progress Notes (Signed)
Pacemaker check in clinic. Thresholds, sensing, impedances consistent with previous measurements. (atrial lead impedance remains low.) Device programmed to maximize longevity. 1129 mode swtiches, longest 291min6sec, A-rates 200's. Device programmed at appropriate safety margins. Histogram distribution appropriate for patient activity level. Device programmed to optimize intrinsic conduction. Device ERI 04/03/16. ROV with SK 04/13/2016.

## 2016-04-13 ENCOUNTER — Encounter: Payer: Self-pay | Admitting: Internal Medicine

## 2016-04-13 ENCOUNTER — Ambulatory Visit (INDEPENDENT_AMBULATORY_CARE_PROVIDER_SITE_OTHER): Payer: BLUE CROSS/BLUE SHIELD | Admitting: Internal Medicine

## 2016-04-13 VITALS — BP 132/82 | HR 107 | Ht 71.0 in | Wt 212.0 lb

## 2016-04-13 DIAGNOSIS — I495 Sick sinus syndrome: Secondary | ICD-10-CM | POA: Diagnosis not present

## 2016-04-13 DIAGNOSIS — T82110D Breakdown (mechanical) of cardiac electrode, subsequent encounter: Secondary | ICD-10-CM | POA: Diagnosis not present

## 2016-04-13 DIAGNOSIS — Z950 Presence of cardiac pacemaker: Secondary | ICD-10-CM | POA: Diagnosis not present

## 2016-04-13 DIAGNOSIS — I2510 Atherosclerotic heart disease of native coronary artery without angina pectoris: Secondary | ICD-10-CM

## 2016-04-13 NOTE — Progress Notes (Signed)
Patient Care Team: Corwin Levins, MD as PCP - General   HPI  [name] is a 50 y.o. male seen in followup for syncope associated with bradycardia and sinus node dysfunction his paroxysmal atrial fibrillation. He is status post pacemaker implantation. His evice has reached ERI;  He has low atral impedance and device was reprogrammed to unipolar pacing He also has a history of coronary disease status post drug-eluting stenting to his right coronary artery with near normal left ventricular function. His catheterization report was reviewed. is exe September 2010. He had overlapping drug-eluting stents. I will check with Dr. Erlinda Hong as to whether DAPT s still necessary. rcise tolerance is quite good.   He has lost 10 lbs  better. Blood pressure is much lower   Past Medical History  Diagnosis Date  . Hyperlipidemia   . Hypertension   . GERD (gastroesophageal reflux disease)   . Depression   . Lumbar disc disease   . Glucose intolerance (impaired glucose tolerance)   . Atrial fibrillation (HCC)   . Syncope   . Hiatal hernia   . Low back pain   . H/O: Bell's palsy   . CAD, NATIVE VESSEL 09/11/2009  . DEPRESSION   . DISC DISEASE, LUMBAR   . HYPERSOMNIA, ASSOCIATED WITH SLEEP APNEA   . LIBIDO, DECREASED   . Pacemaker-st judes 2012  . PULMONARY NODULE 05/03/2008  . Sinoatrial node dysfunction (HCC)   . Atrial lead impedance-intermittently low 05/30/2012    Past Surgical History  Procedure Laterality Date  . Cardiac pacemaker placement      [facility] [phone]  . Lumbar laminectomy      Current Outpatient Prescriptions  Medication Sig Dispense Refill  . aspirin 81 MG tablet Take 81 mg by mouth daily.    . clopidogrel (PLAVIX) 75 MG tablet TAKE ONE TABLET BY MOUTH ONCE DAILY 30 tablet 3  . losartan (COZAAR) 25 MG tablet TAKE ONE TABLET BY MOUTH ONCE DAILY 90 tablet 1  . metoprolol tartrate (LOPRESSOR) 25 MG tablet TAKE ONE TABLET BY MOUTH TWICE DAILY 180 tablet 3  .  nitroGLYCERIN (NITROSTAT) 0.4 MG SL tablet Place 0.4 mg under the tongue every 5 (five) minutes as needed for chest pain (MAX 3 TABLETS).    . rosuvastatin (CRESTOR) 40 MG tablet Take 1 tablet (40 mg total) by mouth daily. 90 tablet 3   No current facility-administered medications for this visit.    Allergies  Allergen Reactions  . Lisinopril     REACTION: cough    Review of Systems negative except from HPI and PMH  Physical Exam BP 132/82 mmHg  Pulse 107  Ht  (1.803 m)  Wt 212 lb (96.163 kg)  BMI 29.58 kg/m2 Well developed and well nourished in no acute distress HENT normal E scleral and icterus clear Neck Supple JVP flat; carotids brisk and full Clear to ausculation regular rate and rhythm, no murmurs gallops or rub Soft with active bowel sounds No clubbing cyanosis  Edema Alert and oriented, grossly normal motor and sensory function Skin Warm and Dry  ECG demonstrates atrial pacing. I ntervals 20/08/36   Assessment and  Plan  Sinus node dysfunction  Atrial fibrillation-paroxysmal  Coronary artery disease with prior DES stenting  Pacemaker-St. Jude  Device has reached ERI  Atrial impedance-low    Device is at [employer]. He will need new generator and because of chronically low atrial impedance I would like to reimplant a new atrial  lead to maximize longevity and to mitigate the likelihood of further damage of the atrial lead requiring a premature procedure  We have reviewed the benefits and risks of generator replacement.  These include but are not limited to lead fracture and infection.  The patient understands, agrees and is willing to proceed.    I have also reached out to Dr. Excell Seltzerooper who implanted his stent 2010 to ask whether double platelet therapy is still necessary

## 2016-05-06 ENCOUNTER — Telehealth: Payer: Self-pay

## 2016-05-06 ENCOUNTER — Encounter: Payer: Self-pay | Admitting: Cardiology

## 2016-05-06 NOTE — Telephone Encounter (Signed)
Left message to call back Pt needs appt with Dr. Ladona Ridgelaylor to setup procedure for generator change and atrial lead revision/extration. Offer pt 1:45pm on 05/06/16 with Dr. Ladona Ridgelaylor.

## 2016-05-13 ENCOUNTER — Encounter: Payer: Self-pay | Admitting: Internal Medicine

## 2016-05-13 ENCOUNTER — Ambulatory Visit (INDEPENDENT_AMBULATORY_CARE_PROVIDER_SITE_OTHER): Payer: BLUE CROSS/BLUE SHIELD | Admitting: Internal Medicine

## 2016-05-13 VITALS — BP 136/98 | HR 55 | Ht 71.0 in | Wt 210.1 lb

## 2016-05-13 DIAGNOSIS — I495 Sick sinus syndrome: Secondary | ICD-10-CM | POA: Diagnosis not present

## 2016-05-13 DIAGNOSIS — T82110A Breakdown (mechanical) of cardiac electrode, initial encounter: Secondary | ICD-10-CM

## 2016-05-13 NOTE — Progress Notes (Signed)
    HPI [name] is referred today by Dr. Klein for consideration for atrial lead extraction. He is a pleasant 49 yo man with sinus node dysfunction and a DDD PM insertion who has reached ERI. He has developed a low atrial lead impedence, both bipolar (less than 200) and unipolar (less than 300) and is here today to discuss atrial lead extraction vs capping and placing a new lead. He otherwise feels well.  Allergies  Allergen Reactions  . Lisinopril     REACTION: cough     Current Outpatient Prescriptions  Medication Sig Dispense Refill  . aspirin 81 MG tablet Take 81 mg by mouth daily.    . clopidogrel (PLAVIX) 75 MG tablet TAKE ONE TABLET BY MOUTH ONCE DAILY 30 tablet 3  . losartan (COZAAR) 25 MG tablet TAKE ONE TABLET BY MOUTH ONCE DAILY 90 tablet 1  . metoprolol tartrate (LOPRESSOR) 25 MG tablet TAKE ONE TABLET BY MOUTH TWICE DAILY 180 tablet 3  . nitroGLYCERIN (NITROSTAT) 0.4 MG SL tablet Place 0.4 mg under the tongue every 5 (five) minutes as needed for chest pain (MAX 3 TABLETS).    . rosuvastatin (CRESTOR) 40 MG tablet Take 1 tablet (40 mg total) by mouth daily. 90 tablet 3   No current facility-administered medications for this visit.     Past Medical History  Diagnosis Date  . Hyperlipidemia   . Hypertension   . GERD (gastroesophageal reflux disease)   . Depression   . Lumbar disc disease   . Glucose intolerance (impaired glucose tolerance)   . Atrial fibrillation (HCC)   . Syncope   . Hiatal hernia   . Low back pain   . H/O: Bell's palsy   . CAD, NATIVE VESSEL 09/11/2009  . DEPRESSION   . DISC DISEASE, LUMBAR   . HYPERSOMNIA, ASSOCIATED WITH SLEEP APNEA   . LIBIDO, DECREASED   . Pacemaker-st judes 2012  . PULMONARY NODULE 05/03/2008  . Sinoatrial node dysfunction (HCC)   . Atrial lead impedance-intermittently low 05/30/2012    ROS:   All systems reviewed and negative except as noted in the HPI.   Past Surgical History  Procedure Laterality Date  .  Cardiac pacemaker placement      St Jude Zephyr [id number]  . Lumbar laminectomy       Family History  Problem Relation Age of Onset  . Coronary artery disease Other   . Diabetes Other   . Hypertension Other   . Hyperlipidemia Other   . Heart attack Father     MI in early 50s  . Diabetes Maternal Grandmother   . Diabetes Maternal Grandfather   . Heart attack Paternal Grandfather      Social History   Social History  . Marital Status: Married    Spouse Name: N/A  . Number of Children: N/A  . Years of Education: N/A   Occupational History  . Not on file.   Social History Main Topics  . Smoking status: Former Smoker    Quit date: 08/15/2007  . Smokeless tobacco: Not on file  . Alcohol Use: No  . Drug Use: No  . Sexual Activity: Not on file   Other Topics Concern  . Not on file   Social History Narrative     BP 136/98 mmHg  Pulse 55  Ht 5' 11" (1.803 m)  Wt 210 lb 1.9 oz (95.31 kg)  BMI 29.32 kg/m2  SpO2 98%  Physical Exam:  Well appearing middle aged man,   NAD HEENT: Unremarkable Neck:  No JVD, no thyromegally Lymphatics:  No adenopathy Back:  No CVA tenderness Lungs:  Clear with no wheezes, rales or rhonchi HEART:  Regular rate rhythm, no murmurs, no rubs, no clicks Abd:  soft, positive bowel sounds, no organomegally, no rebound, no guarding Ext:  2 plus pulses, no edema, no cyanosis, no clubbing Skin:  No rashes no nodules Neuro:  CN II through XII intact, motor grossly intact  EKG - (reviewed) NSR  DEVICE  Normal device function.  See PaceArt for details.   Assess/Plan: 1. Atrial lead disfunction - I have discussed the treatment options in detail. They include generator change only (worst option), insertion of a new atrial lead and gen change, vs atrial lead extraction, insertion of a new atrial lead and gen change. He wishes to proceed with the latter. I have discussed the risks/benefits/goals/expectations of the procedure and he wishes to  proceed. 2. Sinus node dysfunction - he is pacing in the atrium about 75% of the time and is asymptomatic. 3. HTN - he will continue his losartan.  [name],M.D.

## 2016-05-13 NOTE — Patient Instructions (Addendum)
Medication Instructions:  Your physician recommends that you continue on your current medications as directed. Please refer to the Current Medication list given to you today.   Labwork: Labs will be done at the hospital  Testing/Procedures: Atrial lead extraction in OR with new atrial lead placement and generator change.  [facility] 06[facility]16 at  Do not eat or drink after midnight the night prior to procedure Expect one night stay  Follow-Up:  Your physician recommends that you schedule a follow-up appointment in: 10-14 days from 06/11/16 in device clinic for wound check and 3 months from 06/11/16 with Dr Graciela HusbandsKlein        If you need a refill on your cardiac medications before your next appointment, please call your pharmacy.

## 2016-05-14 LAB — CUP PACEART INCLINIC DEVICE CHECK
Brady Statistic RA Percent Paced: 69 %
Brady Statistic RV Percent Paced: 25 %
Implantable Lead Implant Date: 20080909
Implantable Lead Implant Date: 20080909
Implantable Lead Location: 753859
Lead Channel Impedance Value: 547 Ohm
Lead Channel Pacing Threshold Amplitude: 1 V
Lead Channel Pacing Threshold Amplitude: 1.25 V
Lead Channel Pacing Threshold Pulse Width: 0.4 ms
Lead Channel Pacing Threshold Pulse Width: 0.6 ms
Lead Channel Sensing Intrinsic Amplitude: 12 mV
Lead Channel Setting Pacing Amplitude: 2 V
Lead Channel Setting Pacing Amplitude: 2.5 V
Lead Channel Setting Sensing Sensitivity: 2.5 mV
MDC IDC LEAD LOCATION: 753860
MDC IDC MSMT BATTERY IMPEDANCE: 15900 Ohm
MDC IDC MSMT BATTERY VOLTAGE: 2.68 V
MDC IDC MSMT LEADCHNL RA IMPEDANCE VALUE: 291 Ohm
MDC IDC MSMT LEADCHNL RA SENSING INTR AMPL: 2.4 mV
MDC IDC SESS DTM: 20170531125253
MDC IDC SET LEADCHNL RV PACING PULSEWIDTH: 0.4 ms
Pulse Gen Serial Number: 1934644

## 2016-05-15 ENCOUNTER — Telehealth: Payer: Self-pay | Admitting: Internal Medicine

## 2016-05-15 NOTE — Telephone Encounter (Addendum)
Spoke with patient's wife and verified date/time and instructions for 06/11/16  procedure

## 2016-05-15 NOTE — Telephone Encounter (Signed)
-----   Message from Glori BickersStephanie N Dehart, RN sent at 05/15/2016  9:11 AM EDT ----- Mr. Renato Gailssley's lead extraction is scheduled on 6/29, starting at noon. I have also scheduled TEE. Dr. Maren BeachVanTrigt is back-up. [facility] has only 2 docs in the OR that day, so the date/time may change. The case number is [id number].  Thanks, Judeth CornfieldStephanie  ----- Message -----    From: Deliah BostonKelly F Maddyn Lieurance, RN    Sent: 05/14/2016   9:44 AM      To: Glori BickersStephanie N Dehart, RN  Noon is good ----- Message -----    From: Glori BickersStephanie N Dehart, RN    Sent: 05/14/2016   8:48 AM      To: Deliah BostonKelly F Penny Frisbie, RN  Our schedule will not permit a start time of 10. Can we schedule it at noon or would you like to look at another date? ----- Message -----    From: Deliah BostonKelly F Emi Lymon, RN    Sent: 05/13/2016   3:26 PM      To: Glori BickersStephanie N Dehart, RN  He will be ready at 10am ----- Message -----    From: Glori BickersStephanie N Dehart, RN    Sent: 05/13/2016  11:45 AM      To: Deliah BostonKelly F Darsha Zumstein, RN  Tresa EndoKelly,  What time would Dr. Ladona Ridgelaylor be ready to start in the OR after the SVT? We can schedule sometime after noon that day.  Judeth CornfieldStephanie ----- Message -----    From: Deliah BostonKelly F Corrin Sieling, RN    Sent: 05/13/2016   9:05 AM      To: Glori BickersStephanie N Dehart, RN  Atrial lead extraction with new atrial lead placement and generator change as current device ERI.  Looking at 06/11/16.  Let me know time  Ladona Ridgelaylor has a 7:30 SVT now on but I can move if needed Bed Bath & BeyondKelly

## 2016-05-15 NOTE — Telephone Encounter (Signed)
F/u    Pt returning RN call from 6/2

## 2016-05-15 NOTE — Telephone Encounter (Signed)
Left message on machine for patient in regards to time to be at the hospital for procedure on 06/11/16.  He will need to be at the hospital at 10:00am.  Will also need to hold his Plavix for 3 days prior to procedure. I have asked he call me back to confirm he got the message.

## 2016-06-01 ENCOUNTER — Ambulatory Visit (INDEPENDENT_AMBULATORY_CARE_PROVIDER_SITE_OTHER): Payer: BLUE CROSS/BLUE SHIELD | Admitting: Cardiovascular Disease

## 2016-06-01 ENCOUNTER — Encounter: Payer: Self-pay | Admitting: Cardiovascular Disease

## 2016-06-01 VITALS — BP 100/68 | HR 62 | Ht 71.0 in | Wt 209.8 lb

## 2016-06-01 DIAGNOSIS — I252 Old myocardial infarction: Secondary | ICD-10-CM

## 2016-06-01 DIAGNOSIS — E785 Hyperlipidemia, unspecified: Secondary | ICD-10-CM | POA: Diagnosis not present

## 2016-06-01 DIAGNOSIS — I2510 Atherosclerotic heart disease of native coronary artery without angina pectoris: Secondary | ICD-10-CM | POA: Diagnosis not present

## 2016-06-01 NOTE — Patient Instructions (Signed)
Medication Instructions:  Your physician has recommended you make the following change in your medication:  1. STOP Plavix  Labwork: No new orders.   Testing/Procedures: No new orders.   Follow-Up: Your physician wants you to follow-up in: 1 YEAR with Dr Cooper.  You will receive a reminder letter in the mail two months in advance. If you don't receive a letter, please call our office to schedule the follow-up appointment.   Any Other Special Instructions Will Be Listed Below (If Applicable).     If you need a refill on your cardiac medications before your next appointment, please call your pharmacy.   

## 2016-06-01 NOTE — Progress Notes (Signed)
Cardiology Office Note Date:  06/01/2016   ID:  [name], DOB Jul 17, 1966, MRN [id number]  PCP:  Oliver Barre, MD  Cardiologist:  Tonny Bollman, MD    Chief Complaint  Patient presents with  . Coronary Artery Disease    History of Present Illness: [name] is a 50 y.o. male who presents for evaluation of CAD and old MI.   The patient has been followed regularly by Dr. Graciela Husbands after undergoing permanent pacemaker implantation in 2008. He is now scheduled for pacemaker lead removal and placement of a new device by Dr. Ladona Ridgel next week. Because of his history of CAD, he was advised to reestablish care.  The patient presented with an acute inferior wall MI in 2010. These records have been reviewed. He was found to have total occlusion of the right coronary artery. There was extensive complex disease in the RCA and he was treated with primary PCI using 2 overlapping 3.5 mm Xience DES post-dilated to high-pressure with a 3.75 mm [place] balloon. The patient had preserved LV systolic function at the time of his infarct.  He has done very well in the interim. He quit smoking back in 2008. He's physically active both with his work and with regular exercise. He denies any exertional symptoms. Today, he denies symptoms of palpitations, chest pain, shortness of breath, orthopnea, PND, lower extremity edema, dizziness, or syncope. He has been on DAPT with ASA and plavix since 2010. He complains of easy bruising.  Past Medical History  Diagnosis Date  . Hyperlipidemia   . Hypertension   . GERD (gastroesophageal reflux disease)   . Depression   . Lumbar disc disease   . Glucose intolerance (impaired glucose tolerance)   . Atrial fibrillation (HCC)   . Syncope   . Hiatal hernia   . Low back pain   . H/O: Bell's palsy   . CAD, NATIVE VESSEL 09/11/2009  . DEPRESSION   . DISC DISEASE, LUMBAR   . HYPERSOMNIA, ASSOCIATED WITH SLEEP APNEA   . LIBIDO, DECREASED   . Pacemaker-st judes 2012  .  PULMONARY NODULE 05/03/2008  . Sinoatrial node dysfunction (HCC)   . Atrial lead impedance-intermittently low 05/30/2012    Past Surgical History  Procedure Laterality Date  . Cardiac pacemaker placement      [facility] [phone]  . Lumbar laminectomy      Current Outpatient Prescriptions  Medication Sig Dispense Refill  . aspirin 81 MG tablet Take 81 mg by mouth daily.    Marland Kitchen losartan (COZAAR) 25 MG tablet TAKE ONE TABLET BY MOUTH ONCE DAILY 90 tablet 1  . metoprolol tartrate (LOPRESSOR) 25 MG tablet TAKE ONE TABLET BY MOUTH TWICE DAILY 180 tablet 3  . nitroGLYCERIN (NITROSTAT) 0.4 MG SL tablet Place 0.4 mg under the tongue every 5 (five) minutes as needed for chest pain (MAX 3 TABLETS).    . rosuvastatin (CRESTOR) 40 MG tablet Take 1 tablet (40 mg total) by mouth daily. 90 tablet 3   No current facility-administered medications for this visit.    Allergies:   Lisinopril   Social History:  The patient  reports that he quit smoking about 8 years ago. He does not have any smokeless tobacco history on file. He reports that he does not drink alcohol or use illicit drugs.   Family History:  The patient's  family history includes Coronary artery disease in his other; Diabetes in his maternal grandfather, maternal grandmother, and other; Heart attack in his father  and paternal grandfather; Hyperlipidemia in his other; Hypertension in his other.    ROS:  Please see the history of present illness.  Otherwise, review of systems is positive for back pain, muscle pain.  All other systems are reviewed and negative.    PHYSICAL EXAM: VS:  BP 100/68 mmHg  Pulse 62  Ht 5\' 11"  (1.803 m)  Wt 209 lb 12.8 oz (95.165 kg)  BMI 29.27 kg/m2  SpO2 98% , BMI Body mass index is 29.27 kg/(m^2). GEN: Well nourished, well developed, in no acute distress HEENT: normal Neck: no JVD, no masses. No carotid bruits Cardiac: RRR without murmur or gallop                Respiratory:  clear to auscultation  bilaterally, normal work of breathing GI: soft, nontender, nondistended, + BS MS: no deformity or atrophy Ext: no pretibial edema, pedal pulses 2+= bilaterally Skin: warm and dry, no rash Neuro:  Strength and sensation are intact Psych: euthymic mood, full affect  EKG:  EKG is not ordered today.  Recent Labs: 02/25/2016: ALT 20; BUN 19; Creatinine, Ser 1.05; Hemoglobin 16.3; Platelets 237.0; Potassium 4.7; Sodium 142; TSH 0.53   Lipid Panel     Component Value Date/Time   CHOL 132 02/25/2016 1628   TRIG 132.0 02/25/2016 1628   HDL 36.10* 02/25/2016 1628   CHOLHDL 4 02/25/2016 1628   VLDL 26.4 02/25/2016 1628   LDLCALC 70 02/25/2016 1628      Wt Readings from Last 3 Encounters:  06/01/16 209 lb 12.8 oz (95.165 kg)  05/13/16 210 lb 1.9 oz (95.31 kg)  04/13/16 212 lb (96.163 kg)     Cardiac Studies Reviewed: Echocardiogram 08/25/2009: Study Conclusions  1. Left ventricle: The cavity size was normal. Wall thickness was  increased in a pattern of mild LVH. There was mild focal basal  hypertrophy of the septum. Systolic function was normal. The  estimated ejection fraction was in the range of 55% to 60%. 2. Right ventricle: Systolic function was mildly reduced. 3. Pulmonary arteries: Systolic pressure was mildly increased. PA  peak pressure: 34mm Hg (S).  ASSESSMENT AND PLAN: 1.  CAD, native vessel, with old MI: no anginal symptoms. The patient appears to be doing quite well after his remote MI. He has no cardiopulmonary symptoms at this time. He maintains an excellent activity level and exercise program with no exertional symptoms. He is treated with dual antiplatelet therapy, and ARB, beta blocker, and a high intensity statin drug. Based on guideline recommendations, I advised him to discontinue Plavix. He should continue on lifelong aspirin. I will see him back in one year.  2. Essential hypertension: Blood pressure is well controlled on losartan and  metoprolol  3. Hyperlipidemia: The patient is treated with a high intensity statin drug. He will continue on Crestor and his lipids are have been reviewed. They are at goal with an LDL less than 70 mg/dL.  4. Sinus node dysfunction: Status post permanent pacemaker. The patient has atrial lead dysfunction with plans for extraction and a new atrial lead/generator change upcoming with Dr. Ladona Ridgelaylor.  Current medicines are reviewed with the patient today.  The patient does not have concerns regarding medicines.  Labs/ tests ordered today include:  No orders of the defined types were placed in this encounter.    Disposition:   FU one year  Signed, Tonny Bollmanooper, Letti Towell, MD  06/01/2016 4:17 PM    [facility] [address], [place], [place]  [address] Phone: [phone]; Fax: [phone]

## 2016-06-03 ENCOUNTER — Encounter (HOSPITAL_COMMUNITY): Payer: Self-pay

## 2016-06-03 ENCOUNTER — Encounter (HOSPITAL_COMMUNITY)
Admission: RE | Admit: 2016-06-03 | Discharge: 2016-06-03 | Disposition: A | Discharge status: Home / Self Care | Payer: BLUE CROSS/BLUE SHIELD | Source: Ambulatory Visit | Attending: Internal Medicine | Admitting: Internal Medicine

## 2016-06-03 ENCOUNTER — Telehealth: Payer: Self-pay | Admitting: Internal Medicine

## 2016-06-03 ENCOUNTER — Other Ambulatory Visit (HOSPITAL_COMMUNITY): Payer: Self-pay | Admitting: *Deleted

## 2016-06-03 DIAGNOSIS — T82190A Other mechanical complication of cardiac electrode, initial encounter: Secondary | ICD-10-CM | POA: Diagnosis not present

## 2016-06-03 DIAGNOSIS — Y838 Other surgical procedures as the cause of abnormal reaction of the patient, or of later complication, without mention of misadventure at the time of the procedure: Secondary | ICD-10-CM | POA: Insufficient documentation

## 2016-06-03 DIAGNOSIS — Z01812 Encounter for preprocedural laboratory examination: Secondary | ICD-10-CM | POA: Diagnosis not present

## 2016-06-03 HISTORY — DX: Acute myocardial infarction, unspecified: I21.9

## 2016-06-03 LAB — CBC
HCT: 41.1 % (ref 39.0–52.0)
Hemoglobin: 14.5 g/dL (ref 13.0–17.0)
MCH: 30.9 pg (ref 26.0–34.0)
MCHC: 35.3 g/dL (ref 30.0–36.0)
MCV: 87.6 fL (ref 78.0–100.0)
PLATELETS: 167 10*3/uL (ref 150–400)
RBC: 4.69 MIL/uL (ref 4.22–5.81)
RDW: 12 % (ref 11.5–15.5)
WBC: 4.1 10*3/uL (ref 4.0–10.5)

## 2016-06-03 LAB — BASIC METABOLIC PANEL
ANION GAP: 9 (ref 5–15)
BUN: 15 mg/dL (ref 6–20)
CALCIUM: 9.4 mg/dL (ref 8.9–10.3)
CO2: 25 mmol/L (ref 22–32)
CREATININE: 0.91 mg/dL (ref 0.61–1.24)
Chloride: 108 mmol/L (ref 101–111)
GLUCOSE: 87 mg/dL (ref 65–99)
Potassium: 3.8 mmol/L (ref 3.5–5.1)
Sodium: 142 mmol/L (ref 135–145)

## 2016-06-03 NOTE — Telephone Encounter (Signed)
lmom that ok to hold asprin and Dr Ladona Ridgelaylor does his orders

## 2016-06-03 NOTE — Telephone Encounter (Signed)
New message      Request for surgical clearance:  What type of surgery is being performed? Pacemaker lead removal/battery change 1. When is this surgery scheduled? 06-11-16  Are there any medications that need to be held prior to surgery and how long? Can pt hold aspirin 1 week prior to surgery and need orders put in epic 2. Name of physician performing surgery? Dr Ladona Ridgelaylor   3. What is your office phone and fax number? Ok to put in epic

## 2016-06-03 NOTE — Pre-Procedure Instructions (Signed)
[name] D Solomon  06/03/2016     Your procedure is scheduled on Thursday, June 11, 2016 at 12:00 PM.   [facility] Office at 10:00 AM.   Call this number if you have problems the morning of surgery: [phone]   Any questions prior to day of surgery, please call [phone] between 8 & 4 PM.   Remember:  Do not eat food or drink liquids after midnight Wednesday, 06/10/16.  Take these medicines the morning of surgery with A SIP OF WATER: Metoprolol (Lopressor)  Stop Aspirin as instructed by cardiologist.   Do not wear jewelry.  Do not wear lotions, powders, or cologne.  You may NOT wear deoderant.  Men may shave face and neck.  Do not bring valuables to the hospital.  [facility] is not responsible for any belongings or valuables.  Contacts, dentures or bridgework may not be worn into surgery.  Leave your suitcase in the car.  After surgery it may be brought to your room.  For patients admitted to the hospital, discharge time will be determined by your treatment team.  Special instructions:  [place] - Preparing for Surgery  Before surgery, you can play an important role.  Because skin is not sterile, your skin needs to be as free of germs as possible.  You can reduce the number of germs on you skin by washing with CHG (chlorahexidine gluconate) soap before surgery.  CHG is an antiseptic cleaner which kills germs and bonds with the skin to continue killing germs even after washing.  Please DO NOT use if you have an allergy to CHG or antibacterial soaps.  If your skin becomes reddened/irritated stop using the CHG and inform your nurse when you arrive at Short Stay.  Do not shave (including legs and underarms) for at least 48 hours prior to the first CHG shower.  You may shave your face.  Please follow these instructions carefully:   1.  Shower with CHG Soap the night before surgery and the                                morning of  Surgery.  2.  If you choose to wash your hair, wash your hair first as usual with your       normal shampoo.  3.  After you shampoo, rinse your hair and body thoroughly to remove the                      Shampoo.  4.  Use CHG as you would any other liquid soap.  You can apply chg directly       to the skin and wash gently with scrungie or a clean washcloth.  5.  Apply the CHG Soap to your body ONLY FROM THE NECK DOWN.        Do not use on open wounds or open sores.  Avoid contact with your eyes, ears, mouth and genitals (private parts).  Wash genitals (private parts) with your normal soap.  6.  Wash thoroughly, paying special attention to the area where your surgery        will be performed.  7.  Thoroughly rinse your body with warm water from the neck down.  8.  DO NOT shower/wash with your normal soap after using and rinsing off       the CHG Soap.  9.  Pat yourself dry with a clean towel.            10.  Wear clean pajamas.            11.  Place clean sheets on your bed the night of your first shower and do not        sleep with pets.  Day of Surgery  Do not apply any lotions/deodorants the morning of surgery.  Please wear clean clothes to the hospital.   Please read over the following fact sheets that you were given. Pain Booklet, Coughing and Deep Breathing and Surgical Site Infection Prevention

## 2016-06-03 NOTE — Progress Notes (Addendum)
Pt is followed by Dr. Excell Seltzerooper and Dr. Graciela HusbandsKlein for CAD and Pacemaker. He denies any recent chest pain or sob.  EKG - 04/13/16 Cath - 08/25/09 Echo - 2010  Called Dr. Lubertha Basqueaylor's office to see if pt is ok with stopping Aspirin as of today and also requested pre-op orders be put in EPIC.

## 2016-06-04 NOTE — Progress Notes (Signed)
Received voicemail yesterday afternoon from Dr. Lubertha Basqueaylor's office stating it's ok for pt to hold Aspirin. I had already instructed him to do so, but had told him I would call him only if he needed to continue it.

## 2016-06-11 ENCOUNTER — Ambulatory Visit (HOSPITAL_COMMUNITY): Payer: BLUE CROSS/BLUE SHIELD | Admitting: Certified Registered Nurse Anesthetist

## 2016-06-11 ENCOUNTER — Encounter: Payer: Self-pay | Admitting: Cardiothoracic Surgery

## 2016-06-11 ENCOUNTER — Encounter (HOSPITAL_COMMUNITY): Payer: Self-pay | Admitting: Certified Registered Nurse Anesthetist

## 2016-06-11 ENCOUNTER — Ambulatory Visit (HOSPITAL_COMMUNITY)
Admission: RE | Admit: 2016-06-11 | Discharge: 2016-06-12 | Disposition: A | Discharge status: Home / Self Care | Payer: BLUE CROSS/BLUE SHIELD | Source: Ambulatory Visit | Attending: Internal Medicine | Admitting: Internal Medicine

## 2016-06-11 ENCOUNTER — Ambulatory Visit (HOSPITAL_BASED_OUTPATIENT_CLINIC_OR_DEPARTMENT_OTHER): Payer: BLUE CROSS/BLUE SHIELD

## 2016-06-11 ENCOUNTER — Encounter (HOSPITAL_COMMUNITY)
Admission: RE | Disposition: A | Discharge status: Home / Self Care | Payer: Self-pay | Source: Ambulatory Visit | Attending: Internal Medicine

## 2016-06-11 DIAGNOSIS — T82110A Breakdown (mechanical) of cardiac electrode, initial encounter: Secondary | ICD-10-CM | POA: Diagnosis not present

## 2016-06-11 DIAGNOSIS — Z7902 Long term (current) use of antithrombotics/antiplatelets: Secondary | ICD-10-CM | POA: Insufficient documentation

## 2016-06-11 DIAGNOSIS — T82110D Breakdown (mechanical) of cardiac electrode, subsequent encounter: Secondary | ICD-10-CM | POA: Diagnosis not present

## 2016-06-11 DIAGNOSIS — Z950 Presence of cardiac pacemaker: Secondary | ICD-10-CM

## 2016-06-11 DIAGNOSIS — E785 Hyperlipidemia, unspecified: Secondary | ICD-10-CM | POA: Diagnosis not present

## 2016-06-11 DIAGNOSIS — I4891 Unspecified atrial fibrillation: Secondary | ICD-10-CM | POA: Diagnosis not present

## 2016-06-11 DIAGNOSIS — Y831 Surgical operation with implant of artificial internal device as the cause of abnormal reaction of the patient, or of later complication, without mention of misadventure at the time of the procedure: Secondary | ICD-10-CM | POA: Insufficient documentation

## 2016-06-11 DIAGNOSIS — I2510 Atherosclerotic heart disease of native coronary artery without angina pectoris: Secondary | ICD-10-CM | POA: Insufficient documentation

## 2016-06-11 DIAGNOSIS — I471 Supraventricular tachycardia, unspecified: Secondary | ICD-10-CM | POA: Diagnosis present

## 2016-06-11 DIAGNOSIS — Z79899 Other long term (current) drug therapy: Secondary | ICD-10-CM | POA: Diagnosis not present

## 2016-06-11 DIAGNOSIS — T82598A Other mechanical complication of other cardiac and vascular devices and implants, initial encounter: Secondary | ICD-10-CM | POA: Diagnosis not present

## 2016-06-11 DIAGNOSIS — I495 Sick sinus syndrome: Secondary | ICD-10-CM | POA: Diagnosis not present

## 2016-06-11 DIAGNOSIS — I10 Essential (primary) hypertension: Secondary | ICD-10-CM | POA: Insufficient documentation

## 2016-06-11 DIAGNOSIS — Z7982 Long term (current) use of aspirin: Secondary | ICD-10-CM | POA: Insufficient documentation

## 2016-06-11 DIAGNOSIS — Z45010 Encounter for checking and testing of cardiac pacemaker pulse generator [battery]: Secondary | ICD-10-CM | POA: Diagnosis not present

## 2016-06-11 DIAGNOSIS — I252 Old myocardial infarction: Secondary | ICD-10-CM | POA: Diagnosis not present

## 2016-06-11 DIAGNOSIS — Z87891 Personal history of nicotine dependence: Secondary | ICD-10-CM | POA: Insufficient documentation

## 2016-06-11 DIAGNOSIS — K219 Gastro-esophageal reflux disease without esophagitis: Secondary | ICD-10-CM | POA: Diagnosis not present

## 2016-06-11 HISTORY — PX: EP IMPLANTABLE DEVICE: SHX172B

## 2016-06-11 HISTORY — PX: PACEMAKER LEAD REMOVAL: SHX5064

## 2016-06-11 HISTORY — PX: TEE WITHOUT CARDIOVERSION: SHX5443

## 2016-06-11 LAB — PREPARE RBC (CROSSMATCH)

## 2016-06-11 LAB — CBC
HCT: 42 % (ref 39.0–52.0)
HEMOGLOBIN: 14.9 g/dL (ref 13.0–17.0)
MCH: 32 pg (ref 26.0–34.0)
MCHC: 35.5 g/dL (ref 30.0–36.0)
MCV: 90.1 fL (ref 78.0–100.0)
PLATELETS: 167 10*3/uL (ref 150–400)
RBC: 4.66 MIL/uL (ref 4.22–5.81)
RDW: 11.9 % (ref 11.5–15.5)
WBC: 6.5 10*3/uL (ref 4.0–10.5)

## 2016-06-11 LAB — ABO/RH: ABO/RH(D): O POS

## 2016-06-11 LAB — CREATININE, SERUM: CREATININE: 0.89 mg/dL (ref 0.61–1.24)

## 2016-06-11 SURGERY — REMOVAL, ELECTRODE LEAD, CARDIAC PACEMAKER, WITHOUT REPLACEMENT
Anesthesia: General | Site: Chest

## 2016-06-11 MED ORDER — FENTANYL CITRATE (PF) 250 MCG/5ML IJ SOLN
INTRAMUSCULAR | Status: AC
  Filled 2016-06-11: qty 5

## 2016-06-11 MED ORDER — FENTANYL CITRATE (PF) 100 MCG/2ML IJ SOLN
INTRAMUSCULAR | Status: DC | PRN
  Administered 2016-06-11: 100 ug via INTRAVENOUS
  Administered 2016-06-11: 150 ug via INTRAVENOUS

## 2016-06-11 MED ORDER — ACETAMINOPHEN 325 MG PO TABS: 325.0000 mg | ORAL_TABLET | ORAL | Status: DC | PRN

## 2016-06-11 MED ORDER — MEPERIDINE HCL 25 MG/ML IJ SOLN: 6.2500 mg | INTRAMUSCULAR | Status: DC | PRN

## 2016-06-11 MED ORDER — GENTAMICIN SULFATE 40 MG/ML IJ SOLN
INTRAMUSCULAR | Status: AC
  Filled 2016-06-11: qty 2

## 2016-06-11 MED ORDER — ROCURONIUM BROMIDE 50 MG/5ML IV SOLN
INTRAVENOUS | Status: AC
  Filled 2016-06-11: qty 1

## 2016-06-11 MED ORDER — MIDAZOLAM HCL 2 MG/2ML IJ SOLN
INTRAMUSCULAR | Status: AC
  Filled 2016-06-11: qty 2

## 2016-06-11 MED ORDER — ASPIRIN EC 81 MG PO TBEC
81.0000 mg | DELAYED_RELEASE_TABLET | Freq: Every day | ORAL | Status: DC
  Administered 2016-06-11: 81 mg via ORAL
  Filled 2016-06-11: qty 1

## 2016-06-11 MED ORDER — ONDANSETRON HCL 4 MG/2ML IJ SOLN: 4.0000 mg | Freq: Once | INTRAMUSCULAR | Status: DC | PRN

## 2016-06-11 MED ORDER — HEPARIN SODIUM (PORCINE) 1000 UNIT/ML IJ SOLN
INTRAMUSCULAR | Status: AC
  Filled 2016-06-11: qty 1

## 2016-06-11 MED ORDER — LOSARTAN POTASSIUM 25 MG PO TABS: 25.0000 mg | ORAL_TABLET | Freq: Every day | ORAL | Status: DC

## 2016-06-11 MED ORDER — LIDOCAINE HCL (CARDIAC) 20 MG/ML IV SOLN
INTRAVENOUS | Status: DC | PRN
  Administered 2016-06-11: 100 mg via INTRAVENOUS

## 2016-06-11 MED ORDER — EPHEDRINE SULFATE 50 MG/ML IJ SOLN
INTRAMUSCULAR | Status: DC | PRN
  Administered 2016-06-11: 15 mg via INTRAVENOUS
  Administered 2016-06-11: 10 mg via INTRAVENOUS

## 2016-06-11 MED ORDER — METOPROLOL TARTRATE 25 MG PO TABS
25.0000 mg | ORAL_TABLET | Freq: Two times a day (BID) | ORAL | Status: DC
  Administered 2016-06-11: 25 mg via ORAL
  Filled 2016-06-11: qty 1

## 2016-06-11 MED ORDER — VECURONIUM BROMIDE 10 MG IV SOLR
INTRAVENOUS | Status: AC
  Filled 2016-06-11: qty 10

## 2016-06-11 MED ORDER — ONDANSETRON HCL 4 MG/2ML IJ SOLN
INTRAMUSCULAR | Status: DC | PRN
  Administered 2016-06-11: 4 mg via INTRAVENOUS

## 2016-06-11 MED ORDER — LIDOCAINE HCL (PF) 1 % IJ SOLN
INTRAMUSCULAR | Status: AC
  Filled 2016-06-11: qty 30

## 2016-06-11 MED ORDER — SODIUM CHLORIDE 0.9 % IV SOLN
INTRAVENOUS | Status: DC | PRN
  Administered 2016-06-11: 500 mL

## 2016-06-11 MED ORDER — SODIUM CHLORIDE 0.9 % IR SOLN
Status: DC | PRN
  Administered 2016-06-11: 2 mL

## 2016-06-11 MED ORDER — HYDROMORPHONE HCL 1 MG/ML IJ SOLN: 0.2500 mg | INTRAMUSCULAR | Status: DC | PRN

## 2016-06-11 MED ORDER — PROPOFOL 10 MG/ML IV BOLUS
INTRAVENOUS | Status: DC | PRN
  Administered 2016-06-11: 200 mg via INTRAVENOUS

## 2016-06-11 MED ORDER — NITROGLYCERIN 0.4 MG SL SUBL: 0.4000 mg | SUBLINGUAL_TABLET | SUBLINGUAL | Status: DC | PRN

## 2016-06-11 MED ORDER — LACTATED RINGERS IV SOLN
INTRAVENOUS | Status: DC
  Administered 2016-06-11 (×2): via INTRAVENOUS

## 2016-06-11 MED ORDER — METOPROLOL TARTRATE 5 MG/5ML IV SOLN
INTRAVENOUS | Status: DC | PRN
  Administered 2016-06-11 (×2): 2.5 mg via INTRAVENOUS

## 2016-06-11 MED ORDER — CEFAZOLIN SODIUM 1 G IJ SOLR
INTRAMUSCULAR | Status: DC | PRN
  Administered 2016-06-11: 2 g via INTRAMUSCULAR

## 2016-06-11 MED ORDER — ONDANSETRON HCL 4 MG/2ML IJ SOLN
INTRAMUSCULAR | Status: AC
  Filled 2016-06-11: qty 2

## 2016-06-11 MED ORDER — ROSUVASTATIN CALCIUM 10 MG PO TABS
40.0000 mg | ORAL_TABLET | Freq: Every day | ORAL | Status: DC
Start: 2016-06-12 — End: 2016-06-12

## 2016-06-11 MED ORDER — PROPOFOL 10 MG/ML IV BOLUS
INTRAVENOUS | Status: AC
  Filled 2016-06-11: qty 20

## 2016-06-11 MED ORDER — CEFAZOLIN IN D5W 1 GM/50ML IV SOLN
1.0000 g | Freq: Four times a day (QID) | INTRAVENOUS | Status: AC
  Administered 2016-06-11 – 2016-06-12 (×3): 1 g via INTRAVENOUS
  Filled 2016-06-11 (×4): qty 50

## 2016-06-11 MED ORDER — SUGAMMADEX SODIUM 500 MG/5ML IV SOLN
INTRAVENOUS | Status: DC | PRN
  Administered 2016-06-11: 200 mg via INTRAVENOUS

## 2016-06-11 MED ORDER — LIDOCAINE HCL (PF) 1 % IJ SOLN
INTRAMUSCULAR | Status: DC | PRN
  Administered 2016-06-11: 30 mL via INTRADERMAL

## 2016-06-11 MED ORDER — MIDAZOLAM HCL 5 MG/5ML IJ SOLN
INTRAMUSCULAR | Status: DC | PRN
  Administered 2016-06-11: 2 mg via INTRAVENOUS

## 2016-06-11 MED ORDER — HEPARIN SODIUM (PORCINE) 5000 UNIT/ML IJ SOLN
5000.0000 [IU] | Freq: Three times a day (TID) | INTRAMUSCULAR | Status: DC
  Administered 2016-06-11 – 2016-06-12 (×2): 5000 [IU] via SUBCUTANEOUS
  Filled 2016-06-11: qty 1

## 2016-06-11 MED ORDER — ONDANSETRON HCL 4 MG/2ML IJ SOLN: 4.0000 mg | Freq: Four times a day (QID) | INTRAMUSCULAR | Status: DC | PRN

## 2016-06-11 MED ORDER — ROCURONIUM BROMIDE 100 MG/10ML IV SOLN
INTRAVENOUS | Status: DC | PRN
  Administered 2016-06-11: 10 mg via INTRAVENOUS
  Administered 2016-06-11: 30 mg via INTRAVENOUS
  Administered 2016-06-11: 50 mg via INTRAVENOUS

## 2016-06-11 SURGICAL SUPPLY — 50 items
BAG BANDED W/RUBBER/TAPE 36X54 (MISCELLANEOUS) ×2 IMPLANT
BENZOIN TINCTURE PRP APPL 2/3 (GAUZE/BANDAGES/DRESSINGS) ×2 IMPLANT
BLADE 10 SAFETY STRL DISP (BLADE) ×2 IMPLANT
BLADE OSCILLATING /SAGITTAL (BLADE) IMPLANT
BLADE STERNUM SYSTEM 6 (BLADE) ×2 IMPLANT
BNDG COHESIVE 4X5 WHT NS (GAUZE/BANDAGES/DRESSINGS) IMPLANT
CANISTER SUCTION 2500CC (MISCELLANEOUS) ×2 IMPLANT
CLSR STERI-STRIP ANTIMIC 1/2X4 (GAUZE/BANDAGES/DRESSINGS) ×2 IMPLANT
COIL ONE TIE COMPRESSION (MISCELLANEOUS) ×2 IMPLANT
COVER TABLE BACK 60X90 (DRAPES) ×2 IMPLANT
DEVICE TORQUE KENDALL .025-038 (MISCELLANEOUS) ×2 IMPLANT
DRAPE CARDIOVASCULAR INCISE (DRAPES) ×1
DRAPE SRG 135X102X78XABS (DRAPES) ×1 IMPLANT
DRSG OPSITE 6X11 MED (GAUZE/BANDAGES/DRESSINGS) IMPLANT
DRSG TEGADERM 2-3/8X2-3/4 SM (GAUZE/BANDAGES/DRESSINGS) ×2 IMPLANT
ELECT REM PT RETURN 9FT ADLT (ELECTROSURGICAL) ×4
ELECTRODE REM PT RTRN 9FT ADLT (ELECTROSURGICAL) ×2 IMPLANT
GAUZE SPONGE 4X4 12PLY STRL (GAUZE/BANDAGES/DRESSINGS) IMPLANT
GAUZE SPONGE 4X4 16PLY XRAY LF (GAUZE/BANDAGES/DRESSINGS) IMPLANT
GLOVE BIOGEL PI IND STRL 6.5 (GLOVE) ×3 IMPLANT
GLOVE BIOGEL PI IND STRL 7.0 (GLOVE) ×1 IMPLANT
GLOVE BIOGEL PI IND STRL 7.5 (GLOVE) ×1 IMPLANT
GLOVE BIOGEL PI INDICATOR 6.5 (GLOVE) ×3
GLOVE BIOGEL PI INDICATOR 7.0 (GLOVE) ×1
GLOVE BIOGEL PI INDICATOR 7.5 (GLOVE) ×1
GLOVE ECLIPSE 6.5 STRL STRAW (GLOVE) ×2 IMPLANT
GLOVE ECLIPSE 8.0 STRL XLNG CF (GLOVE) ×2 IMPLANT
GLOVE SURG SS PI 6.5 STRL IVOR (GLOVE) ×2 IMPLANT
GOWN STRL REUS W/ TWL LRG LVL3 (GOWN DISPOSABLE) IMPLANT
GOWN STRL REUS W/ TWL XL LVL3 (GOWN DISPOSABLE) ×1 IMPLANT
GOWN STRL REUS W/TWL LRG LVL3 (GOWN DISPOSABLE)
GOWN STRL REUS W/TWL XL LVL3 (GOWN DISPOSABLE) ×1
GUIDEWIRE ANGLED .035X150CM (WIRE) ×2 IMPLANT
GUIDEWIRE WHOLEY .035 145 JTIP (WIRE) ×2 IMPLANT
KIT ROOM TURNOVER OR (KITS) ×2 IMPLANT
LEAD TENDRIL MRI 52CM LPA1200M (Lead) ×2 IMPLANT
NEEDLE PERC 18GX7CM (NEEDLE) ×2 IMPLANT
PACEMAKER ASSURITY DR-RF (Pacemaker) ×4 IMPLANT
PAD ARMBOARD 7.5X6 YLW CONV (MISCELLANEOUS) ×4 IMPLANT
PAD ELECT DEFIB RADIOL ZOLL (MISCELLANEOUS) ×2 IMPLANT
SHEATH EVOLUTION RL 11F (SHEATH) ×2 IMPLANT
SHEATH EVOLUTION SHORTE RL 11F (SHEATH) ×2 IMPLANT
SHEATH PINNACLE 6F 10CM (SHEATH) ×2 IMPLANT
STYLET LIBERATOR LOCKING (MISCELLANEOUS) ×2 IMPLANT
SUT PROLENE 2 0 SH DA (SUTURE) IMPLANT
TOWEL OR 17X24 6PK STRL BLUE (TOWEL DISPOSABLE) ×4 IMPLANT
TOWEL OR 17X26 10 PK STRL BLUE (TOWEL DISPOSABLE) ×4 IMPLANT
TRAY FOLEY IC TEMP SENS 16FR (CATHETERS) ×2 IMPLANT
TUBE CONNECTING 12X1/4 (SUCTIONS) IMPLANT
YANKAUER SUCT BULB TIP NO VENT (SUCTIONS) IMPLANT

## 2016-06-11 NOTE — Anesthesia Preprocedure Evaluation (Signed)
Anesthesia Evaluation  Patient identified by MRN, date of birth, ID band Patient awake    Reviewed: Allergy & Precautions, NPO status , Patient's Chart, lab work & pertinent test results  Airway Mallampati: II  TM Distance: >3 FB Neck ROM: Full    Dental   Pulmonary former smoker,    Pulmonary exam normal        Cardiovascular hypertension, Pt. on medications + CAD and + Past MI  Normal cardiovascular exam+ pacemaker      Neuro/Psych    GI/Hepatic GERD  Controlled,  Endo/Other    Renal/GU      Musculoskeletal   Abdominal   Peds  Hematology   Anesthesia Other Findings   Reproductive/Obstetrics                             Anesthesia Physical Anesthesia Plan  ASA: III  Anesthesia Plan: General   Post-op Pain Management:    Induction: Intravenous  Airway Management Planned: Oral ETT  Additional Equipment: Arterial line  Intra-op Plan:   Post-operative Plan: Extubation in OR  Informed Consent: I have reviewed the patients History and Physical, chart, labs and discussed the procedure including the risks, benefits and alternatives for the proposed anesthesia with the patient or authorized representative who has indicated his/her understanding and acceptance.     Plan Discussed with: CRNA and Surgeon  Anesthesia Plan Comments:         Anesthesia Quick Evaluation

## 2016-06-11 NOTE — Anesthesia Postprocedure Evaluation (Signed)
Anesthesia Post Note  Patient: Cathleen CortiJames D Harnisch  Procedure(s) Performed: Procedure(s) (LRB): PACEMAKER LEAD REMOVAL  (N/A) TRANSESOPHAGEAL ECHOCARDIOGRAM (TEE) (N/A) INSERTION of new right atrial lead and generator device  (N/A)  Patient location during evaluation: PACU Anesthesia Type: General Level of consciousness: awake and alert Pain management: pain level controlled Vital Signs Assessment: post-procedure vital signs reviewed and stable Respiratory status: spontaneous breathing, nonlabored ventilation, respiratory function stable and patient connected to nasal cannula oxygen Cardiovascular status: blood pressure returned to baseline and stable Postop Assessment: no signs of nausea or vomiting Anesthetic complications: no    Last Vitals:  Filed Vitals:   06/11/16 1700 06/11/16 1715  BP: 138/97 136/96  Pulse: 60 60  Temp:    Resp: 11 11    Last Pain:  Filed Vitals:   06/11/16 1717  PainSc: 0-No pain                 Gracie Gupta DAVID

## 2016-06-11 NOTE — Discharge Summary (Signed)
ELECTROPHYSIOLOGY PROCEDURE DISCHARGE SUMMARY    Patient ID: Derrick CortiJames D Waddell,  MRN: [id number], DOB/AGE: November 25, 1966 50 y.o.  Admit date: 06/11/2016 Discharge date: 06/12/16  Primary Care Physician: Oliver BarreJames [name], MD Primary Cardiologist: Dr. Excell Seltzerooper Electrophysiologist: Dr. Graciela HusbandsKlein  Primary Discharge Diagnosis:  1. PPM at elective replacement interval 2. Atrial lead malfunction s/p atrial lead extraction and new Atrial lead placement and PPM generator change  Secondary Discharge Diagnosis:  1. Sinus node dysfunction 2. CAD (last intervention 2010) 3. HTN  Allergies  Allergen Reactions  . Lisinopril     REACTION: cough     Procedures This Admission:  1. Atrial lead extraction  2. Generator change with a STJ dual chamber PPM on 06/11/16 by DrTaylor.  The patient received a St. Jude dual-chamber pacemaker, serial number [id number], and new St. Jude bipolar active fixation atrial pacing lead, serial number [id number].  There were no immediate post procedure complications. 3.  CXR on 06/12/16 demonstrated no pneumothorax status post device implantation.   Brief HPI: [name] is a 50 y.o. male was referred to Dr. Ladona Ridgelaylor in the outpatient setting for consideration of an atrial lead extraction and PPMgenerator change, his initial device having reached ERI and his atrial lead noted to have low impedence and was not capturing in the bipolar pacing mode.   Past medical history includes sinus node dysfunction, CAD, HTN.   Risks, benefits, and alternatives to the procedure were reviewed with the patient who wished to proceed.   Hospital Course:  The patient was admitted and underwent extraction of his atrial lead with implantation of a new atrial lead and pacemaker generator change with details as outlined above. He  was monitored on telemetry overnight which demonstrated A paced/V sensed rhythm.  Left chest was without hematoma or ecchymosis.  The device was interrogated and found to be  functioning normally.  CXR was obtained and demonstrated no pneumothorax status post device implantation, mentioned an Ill-defined airspace opacity within the left lower lobe from potentially atelectasis though aspiration or developing infection, could have a similar appearance.  The patient is afebrile, had a negative lung exam and no symptoms of illness, SOB, or cough.  He is instructed that should he develop any symptoms to see his PMD for follow up and re-evaluation.   He mentions a mild headache does not request any pain relief, states he has been up and out of bed multiple times without difficulty or symptoms otherwise, feels well and wants to go home.  Wound care, arm mobility, and restrictions were reviewed with the patient.  The patient was examined by Dr. Ladona Ridgelaylor and considered stable for discharge to home.    Physical Exam: Filed Vitals:   06/11/16 1845 06/11/16 1915 06/11/16 2115 06/12/16 0528  BP: 140/84 129/85 129/88 128/83  Pulse: 62 64 68 62  Temp:   98.3 F (36.8 C) 98.4 F (36.9 C)  TempSrc:   Oral Oral  Resp: 18 18 18 18   Height:      Weight:      SpO2:   98% 96%    GEN- The patient is well appearing, alert and oriented x 3 today.   HEENT: normocephalic, atraumatic; sclera clear, conjunctiva pink; hearing intact; oropharynx clear; neck supple, no JVP Lungs- Clear to ausculation bilaterally, normal work of breathing.  No wheezes, rales, rhonchi Heart- Regular rate and rhythm, no murmurs, rubs or gallops, PMI not laterally displaced GI- soft, non-tender, non-distended, bowel sounds present, no hepatosplenomegaly Extremities- no clubbing, cyanosis,  or edema; DP/PT/radial pulses 2+ bilaterally, left arm slightly swollen MS- no significant deformity or atrophy Skin- warm and dry, no rash or lesion, left chest without hematoma/ecchymosis Psych- euthymic mood, full affect Neuro- no gross deficits   Labs:   Lab Results  Component Value Date   WBC 6.5 06/11/2016   HGB  14.9 06/11/2016   HCT 42.0 06/11/2016   MCV 90.1 06/11/2016   PLT 167 06/11/2016     Recent Labs Lab 06/11/16 1849  CREATININE 0.89    Discharge Medications:    Medication List    TAKE these medications        aspirin 81 MG tablet  Take 81 mg by mouth daily.     losartan 25 MG tablet  Commonly known as:  COZAAR  TAKE ONE TABLET BY MOUTH ONCE DAILY     metoprolol tartrate 25 MG tablet  Commonly known as:  LOPRESSOR  TAKE ONE TABLET BY MOUTH TWICE DAILY     nitroGLYCERIN 0.4 MG SL tablet  Commonly known as:  NITROSTAT  Place 0.4 mg under the tongue every 5 (five) minutes as needed for chest pain (MAX 3 TABLETS).     rosuvastatin 40 MG tablet  Commonly known as:  CRESTOR  Take 1 tablet (40 mg total) by mouth daily.        Disposition:   Home Discharge Instructions    Diet - low sodium heart healthy    Complete by:  As directed      Increase activity slowly    Complete by:  As directed           Follow-up Information    [facility] [address] [facility] On 06/22/2016.   Specialty:  Cardiology   Why:  9:30AM, wound check   Contact information:   [address], [address] [place] [id number] [phone]      Follow up with Sherryl MangesSteven Klein, MD On 09/15/2016.   Specialty:  Cardiology   Why:  4:00PM   Contact information:   [address] [place] [address] [phone]       Duration of Discharge Encounter: Greater than 30 minutes including physician time.  Norma FredricksonSigned, Renee Ursuy, PA-C 06/12/2016 8:30 AM  EP Attending  Patient seen and examined. Agree with above. He is s/p atrial lead extraction and insertion of a new PM. Device check is good. He is stable for DC. Usual followup.  Leonia ReevesGregg Taylor,M.D.

## 2016-06-11 NOTE — Interval H&P Note (Signed)
History and Physical Interval Note:  06/11/2016 12:58 PM  Derrick CortiJames D Oien  has presented today for surgery, with the diagnosis of Atrial lead dysfunction  The various methods of treatment have been discussed with the patient and family. After consideration of risks, benefits and other options for treatment, the patient has consented to  Procedure(s): PACEMAKER LEAD REMOVAL (N/A) TRANSESOPHAGEAL ECHOCARDIOGRAM (TEE) (N/A) as a surgical intervention .  The patient's history has been reviewed, patient examined, no change in status, stable for surgery.  I have reviewed the patient's chart and labs.  Questions were answered to the patient's satisfaction.     Lewayne BuntingGregg Tremar Wickens

## 2016-06-11 NOTE — Op Note (Signed)
EP procedure note  Procedure performed: Extraction of an atrial pacing lead which had broken, insertion of a new atrial pacing lead, removal of an old pacemaker which had reached elective replacement, and insertion of a new dual-chamber pacemaker utilizing pacemaker pocket revision  Preprocedure diagnosis: Broken atrial pacing lead with the patient's pacemaker at elective replacement.   Postoperative diagnosis: Same as preoperative diagnosis.  Description of the procedure: After informed consent was obtained, the patient was taken to the operating room in the fasting state. The anesthesia service was utilized to provide general endotracheal anesthesia. Invasive hemodynamic monitoring was carried out by way of the right radial artery. A 6 French venous sheath was inserted into the right femoral vein for additional IV access. 30 cc of lidocaine was infiltrated into the left infraclavicular region. A 5 cm incision was carried out. Electrocautery was utilized to dissect down to the pacemaker pocket. The pacemaker generator was removed with gentle traction. The leads were freed up from their dense fibrous adhesions with electrocautery. The leads were disconnected from the old generator. Electrocautery was utilized to dissect down to the pacemaker lead sewing sleeves. The atrial lead was freed up in total. The atrial sleeve was freed up from the atrial pacing lead. A stylette was inserted into the atrial lead and could not be passed all the way to the tip of the atrial lead. The helix was retracted. The lead was cut. A Liberator locking stylette was advanced to the occluded portion of the lead. It could be advanced no further. It was locked in place. The proximal portion of the lead was secured with a one tie suture. At this point, the [facility] short dissection sheath was advanced over the atrial lead and into the subclavian vein utilizing pressure, counter pressure, traction, and countertraction. The short sheath  was removed and the long 11 [place] RL dissection sheath was advanced over the stylette and over the lead down to the junction of the subclavian and innominate vein. The tip of the atrial lead became loose and retracted back into the superior vena cava. Additional traction and countertraction, pressure and counter pressure was then applied to the atrial lead and it was removed in total without hemodynamic sequelae. At this point a woolly wire was inserted through the sheath and into the central circulation. It should be noted that previous attempts to obtain access to the central circulation demonstrated an occluded left subclavian vein. An 8 French peel-away sheath was then advanced over the guidewire. The St. Jude bipolar active fixation atrial pacing lead, serial number [id number] was advanced under fluoroscopic guidance into the right atrium. Mapping was carried out in the right atrium at the final site, the P wave measured 1/2 mV. The pacing impedance was 540 ohms. The patient threshold was a volt and a half at 0.4 ms. Temporal pacing did not stimulate the diaphragm. With the satisfactory parameters, the 8 French sheath was removed and the lead was secured in place with silk suture. The sewing sleeve was secured as well with silk suture. The pocket was irrigated with antibiotic irrigation. The old ventricular pacing lead was evaluated, and found to be working satisfactorily. The pocket was irrigated with antibiotic irrigation. The old dual-chamber pacemaker which had reached elective replacement was discarded, and the new St. Jude dual-chamber pacemaker, serial number [id number], was connected to the new atrial lead and the old ventricular lead. The device was placed back in the subcutaneous pocket, after the pocket was revised to accommodate the  different shape of the device and free up the dense fibrous scar tissue. Additional irrigation was placed in the pocket and the incision was closed with 2 layers of  Vicryl suture. Benzoin and Steri-Strips her pain on the skin. The 6 French sheath that had been placed in the right femoral vein was removed and pressure held until hemostasis was obtained. The patient was returned to the recovery area in stable condition.  Complications: There were no immediate procedure complications  Estimated blood loss: Less than 100 cc  Conclusion: Successful removal of an atrial pacing lead which had broken and was nonfunctional, and successful removal of a old dual-chamber pacemaker which had reached elective replacement, with successful insertion of a new active fixation atrial pacing lead and a new dual-chamber pacemaker in a patient with sinus node dysfunction.  Lewayne BuntingGregg Adelynne Joerger, M.D.

## 2016-06-11 NOTE — H&P (View-Only) (Signed)
HPI [name] is referred today by Dr. Graciela Solomon for consideration for atrial lead extraction. He is a pleasant 50 yo man with sinus node dysfunction and a DDD PM insertion who has reached ERI. He has developed a low atrial lead impedence, both bipolar (less than 200) and unipolar (less than 300) and is here today to discuss atrial lead extraction vs capping and placing a new lead. He otherwise feels well.  Allergies  Allergen Reactions  . Lisinopril     REACTION: cough     Current Outpatient Prescriptions  Medication Sig Dispense Refill  . aspirin 81 MG tablet Take 81 mg by mouth daily.    . clopidogrel (PLAVIX) 75 MG tablet TAKE ONE TABLET BY MOUTH ONCE DAILY 30 tablet 3  . losartan (COZAAR) 25 MG tablet TAKE ONE TABLET BY MOUTH ONCE DAILY 90 tablet 1  . metoprolol tartrate (LOPRESSOR) 25 MG tablet TAKE ONE TABLET BY MOUTH TWICE DAILY 180 tablet 3  . nitroGLYCERIN (NITROSTAT) 0.4 MG SL tablet Place 0.4 mg under the tongue every 5 (five) minutes as needed for chest pain (MAX 3 TABLETS).    . rosuvastatin (CRESTOR) 40 MG tablet Take 1 tablet (40 mg total) by mouth daily. 90 tablet 3   No current facility-administered medications for this visit.     Past Medical History  Diagnosis Date  . Hyperlipidemia   . Hypertension   . GERD (gastroesophageal reflux disease)   . Depression   . Lumbar disc disease   . Glucose intolerance (impaired glucose tolerance)   . Atrial fibrillation (HCC)   . Syncope   . Hiatal hernia   . Low back pain   . H/O: Bell's palsy   . CAD, NATIVE VESSEL 09/11/2009  . DEPRESSION   . DISC DISEASE, LUMBAR   . HYPERSOMNIA, ASSOCIATED WITH SLEEP APNEA   . LIBIDO, DECREASED   . Pacemaker-st judes 2012  . PULMONARY NODULE 05/03/2008  . Sinoatrial node dysfunction (HCC)   . Atrial lead impedance-intermittently low 05/30/2012    ROS:   All systems reviewed and negative except as noted in the HPI.   Past Surgical History  Procedure Laterality Date  .  [facility]      [facility] [phone]  . Lumbar laminectomy       Family History  Problem Relation Age of Onset  . Coronary artery disease Other   . Diabetes Other   . Hypertension Other   . Hyperlipidemia Other   . Heart attack Father     MI in early 4350s  . Diabetes Maternal Grandmother   . Diabetes Maternal Grandfather   . Heart attack Paternal Grandfather      Social History   Social History  . Marital Status: Married    Spouse Name: N/A  . Number of Children: N/A  . Years of Education: N/A   Occupational History  . Not on file.   Social History Main Topics  . Smoking status: Former Smoker    Quit date: 08/15/2007  . Smokeless tobacco: Not on file  . Alcohol Use: No  . Drug Use: No  . Sexual Activity: Not on file   Other Topics Concern  . Not on file   Social History Narrative     BP 136/98 mmHg  Pulse 55  Ht 5\' 11"  (1.803 m)  Wt 210 lb 1.9 oz (95.31 kg)  BMI 29.32 kg/m2  SpO2 98%  Physical Exam:  Well appearing middle aged man,  NAD HEENT: Unremarkable Neck:  No JVD, no thyromegally Lymphatics:  No adenopathy Back:  No CVA tenderness Lungs:  Clear with no wheezes, rales or rhonchi HEART:  Regular rate rhythm, no murmurs, no rubs, no clicks Abd:  soft, positive bowel sounds, no organomegally, no rebound, no guarding Ext:  2 plus pulses, no edema, no cyanosis, no clubbing Skin:  No rashes no nodules Neuro:  CN II through XII intact, motor grossly intact  EKG - (reviewed) NSR  DEVICE  Normal device function.  See PaceArt for details.   Assess/Plan: 1. Atrial lead disfunction - I have discussed the treatment options in detail. They include generator change only (worst option), insertion of a new atrial lead and gen change, vs atrial lead extraction, insertion of a new atrial lead and gen change. He wishes to proceed with the latter. I have discussed the risks/benefits/goals/expectations of the procedure and he wishes to  proceed. 2. Sinus node dysfunction - he is pacing in the atrium about 75% of the time and is asymptomatic. 3. HTN - he will continue his losartan.  [name],M.D.

## 2016-06-11 NOTE — Discharge Instructions (Signed)
° ° °  Supplemental Discharge Instructions for  Pacemaker/Defibrillator Patients  Activity No heavy lifting or vigorous activity with your left/right arm for 6 to 8 weeks.  Do not raise your left/right arm above your head for one week.  Gradually raise your affected arm as drawn below.              06/15/16                       06/16/16                      06/17/16                    06/18/16 __  NO DRIVING for  1 week   ; you may begin driving on  1/6/107/6/17   .  WOUND CARE - Keep the wound area clean and dry.  Do not get this area wet for one week. No showers for one week; you may shower on 06/18/16  . - The tape/steri-strips on your wound will fall off; do not pull them off.  No bandage is needed on the site.  DO  NOT apply any creams, oils, or ointments to the wound area. - If you notice any drainage or discharge from the wound, any swelling or bruising at the site, or you develop a fever > 101? F after you are discharged home, call the office at once.  Special Instructions - You are still able to use cellular telephones; use the ear opposite the side where you have your pacemaker/defibrillator.  Avoid carrying your cellular phone near your device. - When traveling through airports, show security personnel your identification card to avoid being screened in the metal detectors.  Ask the security personnel to use the hand wand. - Avoid arc welding equipment, MRI testing (magnetic resonance imaging), TENS units (transcutaneous nerve stimulators).  Call the office for questions about other devices. - Avoid electrical appliances that are in poor condition or are not properly grounded. - Microwave ovens are safe to be near or to operate.  Additional information for defibrillator patients should your device go off: - If your device goes off ONCE and you feel fine afterward, notify the device clinic nurses. - If your device goes off ONCE and you do not feel well afterward, call 911. - If your device goes  off TWICE, call 911. - If your device goes off THREE times in one day, call 911.  DO NOT DRIVE YOURSELF OR A FAMILY MEMBER WITH A DEFIBRILLATOR TO THE HOSPITAL--CALL 911.

## 2016-06-11 NOTE — Transfer of Care (Signed)
Immediate Anesthesia Transfer of Care Note  Patient: [name]  Procedure(s) Performed: Procedure(s): PACEMAKER LEAD REMOVAL  (N/A) TRANSESOPHAGEAL ECHOCARDIOGRAM (TEE) (N/A) INSERTION of new right atrial lead and generator device  (N/A)  Patient Location: PACU  Anesthesia Type:General  Level of Consciousness: awake, alert , oriented and patient cooperative  Airway & Oxygen Therapy: Patient Spontanous Breathing and Patient connected to nasal cannula oxygen  Post-op Assessment: Report given to RN and Post -op Vital signs reviewed and stable  Post vital signs: Reviewed and stable  Last Vitals:  Filed Vitals:   06/11/16 1027  BP: 146/91  Pulse: 57  Temp: 36.8 C  Resp: 20    Last Pain: There were no vitals filed for this visit.    Patients Stated Pain Goal: 3 (06/11/16 1017)  Complications: No apparent anesthesia complications

## 2016-06-11 NOTE — Anesthesia Procedure Notes (Signed)
Procedure Name: Intubation Date/Time: 06/11/2016 2:01 PM Performed by: Faustino CongressWHITE, Karlina Suares TENA Dawud Mays Pre-anesthesia Checklist: Patient identified, Emergency Drugs available, Suction available and Patient being monitored Patient Re-evaluated:Patient Re-evaluated prior to inductionOxygen Delivery Method: Circle System Utilized Preoxygenation: Pre-oxygenation with 100% oxygen Intubation Type: IV induction Ventilation: Mask ventilation without difficulty Laryngoscope Size: Mac and 3 Grade View: Grade I Tube type: Oral Tube size: 7.5 mm Number of attempts: 1 Airway Equipment and Method: Stylet Placement Confirmation: ETT inserted through vocal cords under direct vision,  positive ETCO2 and breath sounds checked- equal and bilateral Secured at: 23 cm Tube secured with: Tape Dental Injury: Teeth and Oropharynx as per pre-operative assessment

## 2016-06-11 NOTE — Progress Notes (Signed)
  Echocardiogram Echocardiogram Transesophageal has been performed.  [name] 06/11/2016, 2:26 PM

## 2016-06-12 ENCOUNTER — Encounter (HOSPITAL_COMMUNITY): Payer: Self-pay | Admitting: Internal Medicine

## 2016-06-12 ENCOUNTER — Ambulatory Visit (HOSPITAL_COMMUNITY): Payer: BLUE CROSS/BLUE SHIELD

## 2016-06-12 DIAGNOSIS — I495 Sick sinus syndrome: Secondary | ICD-10-CM | POA: Diagnosis not present

## 2016-06-12 DIAGNOSIS — Z87891 Personal history of nicotine dependence: Secondary | ICD-10-CM | POA: Diagnosis not present

## 2016-06-12 DIAGNOSIS — E785 Hyperlipidemia, unspecified: Secondary | ICD-10-CM | POA: Diagnosis not present

## 2016-06-12 DIAGNOSIS — T82110A Breakdown (mechanical) of cardiac electrode, initial encounter: Secondary | ICD-10-CM | POA: Diagnosis not present

## 2016-06-12 DIAGNOSIS — I4891 Unspecified atrial fibrillation: Secondary | ICD-10-CM | POA: Diagnosis not present

## 2016-06-12 DIAGNOSIS — Z45010 Encounter for checking and testing of cardiac pacemaker pulse generator [battery]: Secondary | ICD-10-CM | POA: Diagnosis not present

## 2016-06-12 DIAGNOSIS — R918 Other nonspecific abnormal finding of lung field: Secondary | ICD-10-CM | POA: Diagnosis not present

## 2016-06-12 DIAGNOSIS — I252 Old myocardial infarction: Secondary | ICD-10-CM | POA: Diagnosis not present

## 2016-06-12 DIAGNOSIS — I10 Essential (primary) hypertension: Secondary | ICD-10-CM | POA: Diagnosis not present

## 2016-06-12 DIAGNOSIS — Z7902 Long term (current) use of antithrombotics/antiplatelets: Secondary | ICD-10-CM | POA: Diagnosis not present

## 2016-06-12 DIAGNOSIS — Z7982 Long term (current) use of aspirin: Secondary | ICD-10-CM | POA: Diagnosis not present

## 2016-06-12 DIAGNOSIS — I2510 Atherosclerotic heart disease of native coronary artery without angina pectoris: Secondary | ICD-10-CM | POA: Diagnosis not present

## 2016-06-12 DIAGNOSIS — Z79899 Other long term (current) drug therapy: Secondary | ICD-10-CM | POA: Diagnosis not present

## 2016-06-12 DIAGNOSIS — Z950 Presence of cardiac pacemaker: Secondary | ICD-10-CM | POA: Diagnosis not present

## 2016-06-12 LAB — TYPE AND SCREEN
ABO/RH(D): O POS
ANTIBODY SCREEN: NEGATIVE
UNIT DIVISION: 0
Unit division: 0

## 2016-06-12 MED ORDER — YOU HAVE A PACEMAKER BOOK
Status: DC
  Filled 2016-06-12: qty 1

## 2016-06-12 NOTE — Progress Notes (Signed)
Order received to discharge.  Telemetry removed and CCMD notified.  IV removed with catheter intact.  Discharge education given to Pt with family at bedside.  All questions answered.  Pt denies pain to surgery site.  Pt stable to discharge.

## 2016-06-18 ENCOUNTER — Encounter: Payer: Self-pay | Admitting: Internal Medicine

## 2016-06-22 ENCOUNTER — Ambulatory Visit (INDEPENDENT_AMBULATORY_CARE_PROVIDER_SITE_OTHER): Payer: BLUE CROSS/BLUE SHIELD | Admitting: *Deleted

## 2016-06-22 DIAGNOSIS — Z950 Presence of cardiac pacemaker: Secondary | ICD-10-CM

## 2016-06-22 DIAGNOSIS — I495 Sick sinus syndrome: Secondary | ICD-10-CM | POA: Diagnosis not present

## 2016-06-22 LAB — CUP PACEART INCLINIC DEVICE CHECK
Battery Remaining Longevity: 108 mo
Battery Voltage: 3.01 V
Brady Statistic RA Percent Paced: 53 %
Brady Statistic RV Percent Paced: 0.03 %
Date Time Interrogation Session: 20170710102650
Implantable Lead Implant Date: 20080909
Implantable Lead Location: 753859
Lead Channel Impedance Value: 337.5 Ohm
Lead Channel Pacing Threshold Amplitude: 0.5 V
Lead Channel Pacing Threshold Amplitude: 0.5 V
Lead Channel Pacing Threshold Pulse Width: 0.4 ms
Lead Channel Pacing Threshold Pulse Width: 0.4 ms
Lead Channel Sensing Intrinsic Amplitude: 12 mV
Lead Channel Setting Pacing Amplitude: 2.5 V
Lead Channel Setting Pacing Amplitude: 3.5 V
Lead Channel Setting Sensing Sensitivity: 2 mV
MDC IDC LEAD IMPLANT DT: 20170629
MDC IDC LEAD LOCATION: 753860
MDC IDC MSMT LEADCHNL RA IMPEDANCE VALUE: 475 Ohm
MDC IDC MSMT LEADCHNL RA SENSING INTR AMPL: 1.8 mV
MDC IDC MSMT LEADCHNL RV PACING THRESHOLD AMPLITUDE: 1 V
MDC IDC MSMT LEADCHNL RV PACING THRESHOLD AMPLITUDE: 1 V
MDC IDC MSMT LEADCHNL RV PACING THRESHOLD PULSEWIDTH: 0.4 ms
MDC IDC MSMT LEADCHNL RV PACING THRESHOLD PULSEWIDTH: 0.4 ms
MDC IDC PG SERIAL: 7916025
MDC IDC SET LEADCHNL RV PACING PULSEWIDTH: 0.4 ms

## 2016-06-22 NOTE — Progress Notes (Signed)
Wound check appointment s/p generator change and atrial lead revision 06/11/16. Steri-strips removed. Wound without redness or edema. Incision edges approximated, wound well healed. Normal device function. Thresholds, sensing, and impedances consistent with implant measurements. Device programmed at 3.5V in the RA lead for extra safety margin until 3 month visit. Histogram distribution appropriate for patient and level of activity. No mode switches or high ventricular rates noted. Patient educated about wound care, arm mobility, lifting restrictions. ROV with SK 09/15/16 at 4pm.

## 2016-07-01 ENCOUNTER — Encounter: Payer: Self-pay | Admitting: Internal Medicine

## 2016-07-01 NOTE — Telephone Encounter (Signed)
Called [name]. He denies swelling, drainage or fever at the device site. Unable to originally view image attachment. Scheduled for device clinic 07/02/16 to evaluate wound. He is agreeable.

## 2016-07-02 ENCOUNTER — Ambulatory Visit (INDEPENDENT_AMBULATORY_CARE_PROVIDER_SITE_OTHER): Payer: BLUE CROSS/BLUE SHIELD | Admitting: *Deleted

## 2016-07-02 DIAGNOSIS — L03313 Cellulitis of chest wall: Secondary | ICD-10-CM

## 2016-07-02 DIAGNOSIS — Z950 Presence of cardiac pacemaker: Secondary | ICD-10-CM

## 2016-07-02 MED ORDER — CEPHALEXIN 500 MG PO CAPS: 500.0000 mg | ORAL_CAPSULE | Freq: Four times a day (QID) | ORAL | Status: DC

## 2016-07-02 NOTE — Progress Notes (Addendum)
[facility].  Patient denies tenderness, drainage, fever, or pruritis.  Patient denies any changes to soaps, laundry detergent, lotions, etc.  Incision itself well healed, note erythema surrounding PPM pocket.  Mild edema present, soft to palpation, patient feels it is unchanged since PPM generator change and RA lead revision on 06/11/16.  Dr. Graciela HusbandsKlein saw patient, ordered Keflex 500mg  QID x10 days and ROV on 07/13/16 for reassessment.  Prescription sent electronically to patient's preferred pharmacy.  Patient and wife aware to proceed to ED if patient develops additional signs/symptoms of infection in the interim, including fever/chills, drainage, tenderness at site.  Patient and wife instructed regarding Keflex instructions and verbalize understanding of information.

## 2016-07-07 NOTE — Progress Notes (Signed)
Patient ID: [name], male   DOB: 12-06-1966, 50 y.o.   MRN: [id number] On June 29 I provided operating room backup to Dr. Gilman Schmidt for extraction of pacemaker lead on Mr. Damier Tyrone. Time of procedural backup was 1 hour. Kathlee Nations trigt M.D.

## 2016-07-13 ENCOUNTER — Encounter: Payer: BLUE CROSS/BLUE SHIELD | Admitting: Internal Medicine

## 2016-07-13 ENCOUNTER — Ambulatory Visit (INDEPENDENT_AMBULATORY_CARE_PROVIDER_SITE_OTHER): Payer: BLUE CROSS/BLUE SHIELD | Admitting: *Deleted

## 2016-07-13 DIAGNOSIS — Z950 Presence of cardiac pacemaker: Secondary | ICD-10-CM

## 2016-07-13 DIAGNOSIS — L03313 Cellulitis of chest wall: Secondary | ICD-10-CM

## 2016-07-13 NOTE — Progress Notes (Signed)
Patient seen in device clinic for follow-up regarding redness at [facility] site.  Patient continues to deny tenderness, drainage, fever, or pruritis.  Edema improved.  Slight erythema still present around healed incision and extending over pocket, improved since last visit.  Patient completed Keflex this morning.  Dr. Graciela Husbands saw patient, recommended ROV with device clinic in 2 weeks to reassess site.  Patient aware to call our office in the interim if he develops signs/symptoms of infection, including fever.  Patient agreeable to appointment on 07/28/16 at 4:30pm (Dr. Graciela Husbands in office that day).

## 2016-07-28 ENCOUNTER — Ambulatory Visit (INDEPENDENT_AMBULATORY_CARE_PROVIDER_SITE_OTHER): Payer: BLUE CROSS/BLUE SHIELD | Admitting: *Deleted

## 2016-07-28 DIAGNOSIS — L03313 Cellulitis of chest wall: Secondary | ICD-10-CM

## 2016-07-28 NOTE — Progress Notes (Signed)
Wound recheck s/p 7/31 appt. Incision edges approximated. Wound well healed. Wound without edema. Erythema has greatly improved from previous visit. Very minimal erythema noted around incision site. Dr.Klein assessed wound. No changes or further recommendations at this time.  Patient encouraged to call if he experiences worsening redness, fever, or chills. Patient and wife voiced understanding.

## 2016-08-04 ENCOUNTER — Other Ambulatory Visit: Payer: Self-pay | Admitting: Internal Medicine

## 2016-08-06 ENCOUNTER — Other Ambulatory Visit: Payer: Self-pay | Admitting: *Deleted

## 2016-08-06 MED ORDER — LOSARTAN POTASSIUM 25 MG PO TABS: 25.0000 mg | ORAL_TABLET | Freq: Every day | ORAL | 2 refills | Status: DC

## 2016-08-25 ENCOUNTER — Encounter: Payer: Self-pay | Admitting: Internal Medicine

## 2016-09-15 ENCOUNTER — Encounter: Payer: BLUE CROSS/BLUE SHIELD | Admitting: Internal Medicine

## 2016-09-23 ENCOUNTER — Encounter: Payer: Self-pay | Admitting: Internal Medicine

## 2016-09-23 ENCOUNTER — Ambulatory Visit (INDEPENDENT_AMBULATORY_CARE_PROVIDER_SITE_OTHER): Payer: BLUE CROSS/BLUE SHIELD | Admitting: Internal Medicine

## 2016-09-23 VITALS — BP 122/80 | HR 63 | Ht 71.0 in | Wt 211.8 lb

## 2016-09-23 DIAGNOSIS — Z950 Presence of cardiac pacemaker: Secondary | ICD-10-CM | POA: Diagnosis not present

## 2016-09-23 DIAGNOSIS — I480 Paroxysmal atrial fibrillation: Secondary | ICD-10-CM | POA: Diagnosis not present

## 2016-09-23 DIAGNOSIS — I495 Sick sinus syndrome: Secondary | ICD-10-CM

## 2016-09-23 DIAGNOSIS — I2510 Atherosclerotic heart disease of native coronary artery without angina pectoris: Secondary | ICD-10-CM | POA: Diagnosis not present

## 2016-09-23 LAB — CUP PACEART INCLINIC DEVICE CHECK
Battery Remaining Longevity: 130.8
Brady Statistic RV Percent Paced: 0.09 %
Date Time Interrogation Session: 20171011174755
Implantable Lead Implant Date: 20170629
Implantable Lead Location: 753860
Lead Channel Pacing Threshold Amplitude: 1.25 V
Lead Channel Pacing Threshold Pulse Width: 0.4 ms
Lead Channel Sensing Intrinsic Amplitude: 12 mV
Lead Channel Setting Pacing Amplitude: 2 V
Lead Channel Setting Pacing Amplitude: 2.5 V
Lead Channel Setting Pacing Pulse Width: 0.4 ms
Lead Channel Setting Sensing Sensitivity: 2 mV
MDC IDC LEAD IMPLANT DT: 20080909
MDC IDC LEAD LOCATION: 753859
MDC IDC MSMT BATTERY VOLTAGE: 3.02 V
MDC IDC MSMT LEADCHNL RA IMPEDANCE VALUE: 512.5 Ohm
MDC IDC MSMT LEADCHNL RA PACING THRESHOLD AMPLITUDE: 0.5 V
MDC IDC MSMT LEADCHNL RA PACING THRESHOLD PULSEWIDTH: 0.4 ms
MDC IDC MSMT LEADCHNL RA SENSING INTR AMPL: 1.1 mV
MDC IDC MSMT LEADCHNL RV IMPEDANCE VALUE: 387.5 Ohm
MDC IDC PG SERIAL: 7916025
MDC IDC STAT BRADY RA PERCENT PACED: 59 %
Pulse Gen Model: 2272

## 2016-09-23 NOTE — Progress Notes (Signed)
Patient Care Team: Corwin LevinsJames W [name], MD as PCP - General   HPI  Derrick CortiJames D Solomon is a 50 y.o. male seen in followup for syncope associated with bradycardia and sinus node dysfunction his paroxysmal atrial fibrillation. He is status post pacemaker implantation. His device reached ERI 5/17 the underwent generator replacement with insertion of a new atrial lead because of chronically low atrial impedances and extraction of the previously implanted atrial lead   He also has a history of coronary disease status post drug-eluting stenting to his right coronary artery with near normal left ventricular function. His catheterization report was reviewed. is exe September 2010. He had overlapping drug-eluting stents. I  [facility] with [facility]. [facility] as to whether DAPT s still necessary.  Said we could go alone with ASA exercise tolerance is quite good.   He has lost 10 lbs  better. Blood pressure is much lower   Past Medical History:  Diagnosis Date  . Atrial fibrillation (HCC)   . Atrial lead impedance-intermittently low 05/30/2012  . CAD, NATIVE VESSEL 09/11/2009  . DISC DISEASE, LUMBAR   . GERD (gastroesophageal reflux disease)   . Glucose intolerance (impaired glucose tolerance)   . H/O: Bell's palsy   . Hiatal hernia   . Hyperlipidemia   . HYPERSOMNIA, ASSOCIATED WITH SLEEP APNEA   . Hypertension   . LIBIDO, DECREASED   . Lumbar disc disease   . Myocardial infarction 2010  . Pacemaker-st judes 2012  . PULMONARY NODULE 05/03/2008  . Sinoatrial node dysfunction (HCC)   . Syncope     Past Surgical History:  Procedure Laterality Date  . BACK SURGERY    . CARDIAC PACEMAKER PLACEMENT  [address]2008   [facility] [phone]  . CORONARY ANGIOPLASTY WITH STENT PLACEMENT  2010  . EP IMPLANTABLE DEVICE N/A 06/11/2016   Procedure: INSERTION of new right atrial lead and generator device ;  Surgeon: [name], MD;  [facility];  Service: Cardiovascular;  Laterality: N/A;  . LUMBAR LAMINECTOMY  1990s  .  PACEMAKER LEAD REMOVAL  06/11/2016   INSERTION of new right atrial lead and generator device (  . PACEMAKER LEAD REMOVAL N/A 06/11/2016   Procedure: PACEMAKER LEAD REMOVAL ;  Surgeon: [name], MD;  [facility];  Service: Cardiovascular;  Laterality: N/A;  . TEE WITHOUT CARDIOVERSION N/A 06/11/2016   Procedure: TRANSESOPHAGEAL ECHOCARDIOGRAM (TEE);  Surgeon: [name], MD;  [facility];  Service: Cardiovascular;  Laterality: N/A;    Current Outpatient Prescriptions  Medication Sig Dispense Refill  . aspirin 81 MG tablet Take 81 mg by mouth daily.    Marland Kitchen. losartan (COZAAR) 25 MG tablet Take 1 tablet (25 mg total) by mouth daily. 90 tablet 2  . metoprolol tartrate (LOPRESSOR) 25 MG tablet TAKE ONE TABLET BY MOUTH TWICE DAILY 180 tablet 2  . nitroGLYCERIN (NITROSTAT) 0.4 MG SL tablet Place 0.4 mg under the tongue every 5 (five) minutes as needed for chest pain (MAX 3 TABLETS). Reported on 07/02/2016    . rosuvastatin (CRESTOR) 40 MG tablet Take 1 tablet (40 mg total) by mouth daily. 90 tablet 3   No current facility-administered medications for this visit.     Allergies  Allergen Reactions  . Lisinopril     REACTION: cough    Review of Systems negative except from HPI and PMH  Physical Exam BP 122/80   Pulse 63   Ht 5\' 11"  (1.803 m)   Wt 211 lb  12.8 oz (96.1 kg)   SpO2 95%   BMI 29.54 kg/m  Well developed and well nourished in no acute distress HENT normal E scleral and icterus clear Neck Supple JVP flat; carotids brisk and full Clear to ausculation regular rate and rhythm, no murmurs gallops or rub Soft with active bowel sounds No clubbing cyanosis  Edema Alert and oriented, grossly normal motor and sensory function Skin Warm and Dry  ECG demonstrates atrial pacing. Intervals 20/08/36   Assessment and  Plan  Sinus node dysfunction  Atrial fibrillation-paroxysmal  Coronary artery disease with prior DES stenting  Pacemaker-St. Jude    Atrial  impedance-low  replacement  Without symptoms of ischemia  There is far field oversensing on the atrial lead of ventricular sensed events. He also has what appears to be a junctional beat with retrograde conduction both up the fastpathway as well as of the slow pathway as there was no far field signal during ventricular sensed events and sinus rhythm.   We spent more than 50% of our >25 min visit in face to face counseling regarding the above

## 2016-09-23 NOTE — Patient Instructions (Signed)
Medication Instructions: - Your physician recommends that you continue on your current medications as directed. Please refer to the Current Medication list given to you today.  Labwork: - none ordered  Procedures/Testing: - none ordered  Follow-Up: - Remote monitoring is used to monitor your Pacemaker of ICD from home. This monitoring reduces the number of office visits required to check your device to one time per year. It allows us to keep an eye on the functioning of your device to ensure it is working properly. You are scheduled for a device check from home on 12/23/16. You may send your transmission at any time that day. If you have a wireless device, the transmission will be sent automatically. After your physician reviews your transmission, you will receive a postcard with your next transmission date.  - Your physician wants you to follow-up in: 9 months with Gypsy BalsamAmber Seiler, NP for Dr. Graciela HusbandsKlein. You will receive a reminder letter in the mail two months in advance. If you don't receive a letter, please call our office to schedule the follow-up appointment.  Any Additional Special Instructions Will Be Listed Below (If Applicable).     If you need a refill on your cardiac medications before your next appointment, please call your pharmacy.

## 2016-09-28 NOTE — Addendum Note (Signed)
Addended by: Dareen PianoKENAN, Nikitia Asbill C on: 09/28/2016 10:47 AM   Modules accepted: Orders

## 2016-12-23 ENCOUNTER — Ambulatory Visit (INDEPENDENT_AMBULATORY_CARE_PROVIDER_SITE_OTHER): Payer: BLUE CROSS/BLUE SHIELD | Admitting: *Deleted

## 2016-12-23 DIAGNOSIS — I495 Sick sinus syndrome: Secondary | ICD-10-CM

## 2016-12-23 NOTE — Progress Notes (Signed)
Remote pacemaker transmission.   

## 2016-12-24 ENCOUNTER — Encounter: Payer: Self-pay | Admitting: Cardiology

## 2017-01-07 LAB — CUP PACEART REMOTE DEVICE CHECK
Battery Remaining Longevity: 118 mo
Brady Statistic AP VP Percent: 1 %
Brady Statistic AP VS Percent: 53 %
Brady Statistic AS VP Percent: 1 %
Brady Statistic RV Percent Paced: 1 %
Date Time Interrogation Session: 20180110070013
Implantable Lead Location: 753859
Lead Channel Impedance Value: 390 Ohm
Lead Channel Impedance Value: 480 Ohm
Lead Channel Pacing Threshold Amplitude: 1.25 V
Lead Channel Sensing Intrinsic Amplitude: 12 mV
Lead Channel Setting Pacing Amplitude: 2 V
Lead Channel Setting Pacing Pulse Width: 0.4 ms
MDC IDC LEAD IMPLANT DT: 20080909
MDC IDC LEAD IMPLANT DT: 20170629
MDC IDC LEAD LOCATION: 753860
MDC IDC MSMT BATTERY REMAINING PERCENTAGE: 95.5 %
MDC IDC MSMT BATTERY VOLTAGE: 3.02 V
MDC IDC MSMT LEADCHNL RA PACING THRESHOLD AMPLITUDE: 0.5 V
MDC IDC MSMT LEADCHNL RA PACING THRESHOLD PULSEWIDTH: 0.4 ms
MDC IDC MSMT LEADCHNL RA SENSING INTR AMPL: 1.2 mV
MDC IDC MSMT LEADCHNL RV PACING THRESHOLD PULSEWIDTH: 0.4 ms
MDC IDC PG IMPLANT DT: 20170629
MDC IDC SET LEADCHNL RV PACING AMPLITUDE: 2.5 V
MDC IDC SET LEADCHNL RV SENSING SENSITIVITY: 2 mV
MDC IDC STAT BRADY AS VS PERCENT: 47 %
MDC IDC STAT BRADY RA PERCENT PACED: 52 %
Pulse Gen Model: 2272
Pulse Gen Serial Number: 7916025

## 2017-01-19 ENCOUNTER — Other Ambulatory Visit: Payer: Self-pay | Admitting: Internal Medicine

## 2017-02-25 ENCOUNTER — Other Ambulatory Visit (INDEPENDENT_AMBULATORY_CARE_PROVIDER_SITE_OTHER): Payer: BLUE CROSS/BLUE SHIELD

## 2017-02-25 ENCOUNTER — Ambulatory Visit (INDEPENDENT_AMBULATORY_CARE_PROVIDER_SITE_OTHER): Payer: BLUE CROSS/BLUE SHIELD | Admitting: Internal Medicine

## 2017-02-25 ENCOUNTER — Encounter: Payer: Self-pay | Admitting: Internal Medicine

## 2017-02-25 VITALS — BP 132/88 | HR 86 | Temp 97.6°F | Ht 71.0 in | Wt 216.0 lb

## 2017-02-25 DIAGNOSIS — Z0000 Encounter for general adult medical examination without abnormal findings: Secondary | ICD-10-CM | POA: Diagnosis not present

## 2017-02-25 DIAGNOSIS — L989 Disorder of the skin and subcutaneous tissue, unspecified: Secondary | ICD-10-CM | POA: Diagnosis not present

## 2017-02-25 DIAGNOSIS — H9192 Unspecified hearing loss, left ear: Secondary | ICD-10-CM | POA: Diagnosis not present

## 2017-02-25 LAB — CBC WITH DIFFERENTIAL/PLATELET
BASOS PCT: 0.9 % (ref 0.0–3.0)
Basophils Absolute: 0 10*3/uL (ref 0.0–0.1)
Eosinophils Absolute: 0.2 10*3/uL (ref 0.0–0.7)
Eosinophils Relative: 3.7 % (ref 0.0–5.0)
HCT: 45.4 % (ref 39.0–52.0)
Hemoglobin: 15.9 g/dL (ref 13.0–17.0)
Lymphocytes Relative: 39.3 % (ref 12.0–46.0)
Lymphs Abs: 2 10*3/uL (ref 0.7–4.0)
MCHC: 35.1 g/dL (ref 30.0–36.0)
MCV: 91 fl (ref 78.0–100.0)
MONOS PCT: 11.8 % (ref 3.0–12.0)
Monocytes Absolute: 0.6 10*3/uL (ref 0.1–1.0)
NEUTROS ABS: 2.3 10*3/uL (ref 1.4–7.7)
Neutrophils Relative %: 44.3 % (ref 43.0–77.0)
PLATELETS: 208 10*3/uL (ref 150.0–400.0)
RBC: 4.99 Mil/uL (ref 4.22–5.81)
RDW: 12.5 % (ref 11.5–15.5)
WBC: 5.1 10*3/uL (ref 4.0–10.5)

## 2017-02-25 LAB — URINALYSIS, ROUTINE W REFLEX MICROSCOPIC
Bilirubin Urine: NEGATIVE
Ketones, ur: NEGATIVE
Leukocytes, UA: NEGATIVE
Nitrite: NEGATIVE
SPECIFIC GRAVITY, URINE: 1.02 (ref 1.000–1.030)
Total Protein, Urine: NEGATIVE
Urine Glucose: NEGATIVE
Urobilinogen, UA: 0.2 (ref 0.0–1.0)
WBC UA: NONE SEEN (ref 0–?)
pH: 6 (ref 5.0–8.0)

## 2017-02-25 MED ORDER — TRIAMCINOLONE ACETONIDE 0.1 % EX CREA: 1.0000 "application " | TOPICAL_CREAM | Freq: Two times a day (BID) | CUTANEOUS | 0 refills | Status: DC

## 2017-02-25 MED ORDER — LOSARTAN POTASSIUM 25 MG PO TABS: 25.0000 mg | ORAL_TABLET | Freq: Every day | ORAL | 3 refills | Status: DC

## 2017-02-25 MED ORDER — ROSUVASTATIN CALCIUM 40 MG PO TABS
40.0000 mg | ORAL_TABLET | Freq: Every day | ORAL | 3 refills | Status: DC
Start: 2017-02-25 — End: 2018-03-31

## 2017-02-25 NOTE — Assessment & Plan Note (Signed)
c/w small area dermatitis, for triam cr prn,  to f/u any worsening symptoms or concerns

## 2017-02-25 NOTE — Progress Notes (Signed)
Subjective:    Patient ID: [name], male    DOB: Apr 21, 1966, 51 y.o.   MRN: [id number]  HPI  Here for wellness and f/u;  Overall doing ok;  Pt denies Chest pain, worsening SOB, DOE, wheezing, orthopnea, PND, worsening LE edema, palpitations, dizziness or syncope.  Pt denies neurological change such as new headache, facial or extremity weakness.  Pt denies polydipsia, polyuria, or low sugar symptoms. Pt states overall good compliance with treatment and medications, good tolerability, and has been trying to follow appropriate diet.  Pt denies worsening depressive symptoms, suicidal ideation or panic. No fever, night sweats, wt loss, loss of appetite, or other constitutional symptoms.  Pt states good ability with ADL's, has low fall risk, home safety reviewed and adequate, no other significant changes in hearing or vision, and only occasionally active with exercise.  c/o small area left lower leg about 1/2 area erythema with itching.  Also with left ear reduced hearing for 1 wk Past Medical History:  Diagnosis Date  . Atrial fibrillation (HCC)   . Atrial lead impedance-intermittently low 05/30/2012  . CAD, NATIVE VESSEL 09/11/2009  . DISC DISEASE, LUMBAR   . GERD (gastroesophageal reflux disease)   . Glucose intolerance (impaired glucose tolerance)   . H/O: Bell's palsy   . Hiatal hernia   . Hyperlipidemia   . HYPERSOMNIA, ASSOCIATED WITH SLEEP APNEA   . Hypertension   . LIBIDO, DECREASED   . Lumbar disc disease   . Myocardial infarction 2010  . Pacemaker-st judes 2012  . PULMONARY NODULE 05/03/2008  . Sinoatrial node dysfunction (HCC)   . Syncope    Past Surgical History:  Procedure Laterality Date  . BACK SURGERY    . CARDIAC PACEMAKER PLACEMENT  [address] [place] [phone]  . CORONARY ANGIOPLASTY WITH STENT PLACEMENT  2010  . EP IMPLANTABLE DEVICE N/A 06/11/2016   Procedure: INSERTION of new right atrial lead and generator device ;  Surgeon: Marinus Maw, MD;  Location: [facility] OR;   Service: Cardiovascular;  Laterality: N/A;  . LUMBAR LAMINECTOMY  1990s  . PACEMAKER LEAD REMOVAL  06/11/2016   INSERTION of new right atrial lead and generator device (  . PACEMAKER LEAD REMOVAL N/A 06/11/2016   Procedure: PACEMAKER LEAD REMOVAL ;  Surgeon: Marinus Maw, MD;  Location: [facility] OR;  Service: Cardiovascular;  Laterality: N/A;  . TEE WITHOUT CARDIOVERSION N/A 06/11/2016   Procedure: TRANSESOPHAGEAL ECHOCARDIOGRAM (TEE);  Surgeon: Marinus Maw, MD;  Location: [facility] OR;  Service: Cardiovascular;  Laterality: N/A;    reports that he quit smoking about 9 years ago. His smoking use included Cigarettes. He has a 25.00 pack-year smoking history. He has quit using smokeless tobacco. He reports that he drinks about 1.2 oz of alcohol per week . He reports that he does not use drugs. family history includes Coronary artery disease in his other; Diabetes in his maternal grandfather, maternal grandmother, and other; Heart attack in his father and paternal grandfather; Hyperlipidemia in his other; Hypertension in his other. Allergies  Allergen Reactions  . Lisinopril     REACTION: cough   Current Outpatient Prescriptions on File Prior to Visit  Medication Sig Dispense Refill  . aspirin 81 MG tablet Take 81 mg by mouth daily.    Marland Kitchen losartan (COZAAR) 25 MG tablet Take 1 tablet (25 mg total) by mouth daily. 90 tablet 2  . metoprolol tartrate (LOPRESSOR) 25 MG tablet TAKE ONE TABLET BY MOUTH TWICE DAILY 180 tablet  2  . nitroGLYCERIN (NITROSTAT) 0.4 MG SL tablet Place 0.4 mg under the tongue every 5 (five) minutes as needed for chest pain (MAX 3 TABLETS). Reported on 07/02/2016    . rosuvastatin (CRESTOR) 40 MG tablet Take 1 tablet (40 mg total) by mouth daily. 90 tablet 3   No current facility-administered medications on file prior to visit.    Review of Systems Constitutional: Negative for increased diaphoresis, or other activity, appetite or siginficant weight change other than noted HENT:  Negative for worsening hearing loss, ear pain, facial swelling, mouth sores and neck stiffness.   Eyes: Negative for other worsening pain, redness or visual disturbance.  Respiratory: Negative for choking or stridor Cardiovascular: Negative for other chest pain and palpitations.  Gastrointestinal: Negative for worsening diarrhea, blood in stool, or abdominal distention Genitourinary: Negative for hematuria, flank pain or change in urine volume.  Musculoskeletal: Negative for myalgias or other joint complaints.  Skin: Negative for other color change and wound or drainage.  Neurological: Negative for syncope and numbness. other than noted Hematological: Negative for adenopathy. or other swelling Psychiatric/Behavioral: Negative for hallucinations, SI, self-injury, decreased concentration or other worsening agitation.  All other system neg per pt    Objective:   Physical Exam BP 132/88   Pulse 86   Temp 97.6 F (36.4 C)   Ht 5\' 11"  (1.803 m)   Wt 216 lb (98 kg)   SpO2 98%   BMI 30.13 kg/m  VS noted,  Constitutional: Pt is oriented to person, place, and time. Appears well-developed and well-nourished, in no significant distress Head: Normocephalic and atraumatic  Eyes: Conjunctivae and EOM are normal. Pupils are equal, round, and reactive to light Right Ear: External ear normal.  Left Ear: External ear normal Nose: Nose normal.  Mouth/Throat: Oropharynx is clear and moist  Neck: Normal range of motion. Neck supple. No JVD present. No tracheal deviation present or significant neck LA or mass Cardiovascular: Normal rate, regular rhythm, normal heart sounds and intact distal pulses.   Pulmonary/Chest: Effort normal and breath sounds without rales or wheezing  Abdominal: Soft. Bowel sounds are normal. NT. No HSM  Musculoskeletal: Normal range of motion. Exhibits no edema Lymphadenopathy: Has no cervical adenopathy.  Neurological: Pt is alert and oriented to person, place, and time. Pt  has normal reflexes. No cranial nerve deficit. Motor grossly intact Skin: Skin is warm and dry. No rash noted or new ulcers except for erythem non raised scaly area 1/2 cm to medial mid left lower leg Psychiatric:  Has normal mood and affect. Behavior is normal.  No other exam findings    Assessment & Plan:

## 2017-02-25 NOTE — Assessment & Plan Note (Signed)
Improved after wax impaction removed with irrigation

## 2017-02-25 NOTE — Patient Instructions (Signed)
Your left ear was irrigate of wax today  Please take all new medication as prescribed - the cream for the dermatitis area  Please continue all other medications as before, and refills have been done if requested.  Please have the pharmacy call with any other refills you may need.  Please continue your efforts at being more active, low cholesterol diet, and weight control.  You are otherwise up to date with prevention measures today.  Please keep your appointments with your specialists as you may have planned  Please go to the LAB in the Basement (turn left off the elevator) for the tests to be done today  You will be contacted by phone if any changes need to be made immediately.  Otherwise, you will receive a letter about your results with an explanation, but please check with MyChart first.  Please remember to sign up for MyChart if you have not done so, as this will be important to you in the future with finding out test results, communicating by private email, and scheduling acute appointments online when needed.  Please return in 1 year for your yearly visit, or sooner if needed, with Lab testing done 3-5 days before

## 2017-02-25 NOTE — Assessment & Plan Note (Signed)

## 2017-02-26 LAB — LIPID PANEL
CHOLESTEROL: 126 mg/dL (ref 0–200)
HDL: 36.6 mg/dL — ABNORMAL LOW (ref >39.00)
LDL CALC: 70 mg/dL (ref 0–99)
NonHDL: 89.05
Total CHOL/HDL Ratio: 3
Triglycerides: 96 mg/dL (ref 0.0–149.0)
VLDL: 19.2 mg/dL (ref 0.0–40.0)

## 2017-02-26 LAB — PSA: PSA: 0.63 ng/mL (ref 0.10–4.00)

## 2017-02-26 LAB — BASIC METABOLIC PANEL
BUN: 20 mg/dL (ref 6–23)
CO2: 29 meq/L (ref 19–32)
Calcium: 10.1 mg/dL (ref 8.4–10.5)
Chloride: 103 mEq/L (ref 96–112)
Creatinine, Ser: 0.9 mg/dL (ref 0.40–1.50)
GFR: 93.94 mL/min (ref >60.00)
GLUCOSE: 84 mg/dL (ref 70–99)
Potassium: 4.4 mEq/L (ref 3.5–5.1)
SODIUM: 138 meq/L (ref 135–145)

## 2017-02-26 LAB — TSH: TSH: 1.02 u[IU]/mL (ref 0.35–4.50)

## 2017-02-26 LAB — HEPATIC FUNCTION PANEL
ALT: 18 U/L (ref 0–53)
AST: 22 U/L (ref 0–37)
Albumin: 4.6 g/dL (ref 3.5–5.2)
Alkaline Phosphatase: 36 U/L — ABNORMAL LOW (ref 39–117)
BILIRUBIN DIRECT: 0.2 mg/dL (ref 0.0–0.3)
Total Bilirubin: 0.9 mg/dL (ref 0.2–1.2)
Total Protein: 7 g/dL (ref 6.0–8.3)

## 2017-02-26 LAB — HIV ANTIBODY (ROUTINE TESTING W REFLEX): HIV: NONREACTIVE

## 2017-03-24 ENCOUNTER — Ambulatory Visit (INDEPENDENT_AMBULATORY_CARE_PROVIDER_SITE_OTHER): Payer: BLUE CROSS/BLUE SHIELD | Admitting: *Deleted

## 2017-03-24 DIAGNOSIS — I495 Sick sinus syndrome: Secondary | ICD-10-CM | POA: Diagnosis not present

## 2017-03-24 NOTE — Progress Notes (Signed)
Remote pacemaker transmission.   

## 2017-03-25 ENCOUNTER — Encounter: Payer: Self-pay | Admitting: Cardiology

## 2017-03-26 LAB — CUP PACEART REMOTE DEVICE CHECK
Battery Remaining Percentage: 95.5 %
Battery Voltage: 3.02 V
Brady Statistic AP VP Percent: 1 %
Brady Statistic AS VS Percent: 43 %
Brady Statistic RA Percent Paced: 56 %
Brady Statistic RV Percent Paced: 1 %
Date Time Interrogation Session: 20180411060017
Implantable Lead Location: 753860
Implantable Pulse Generator Implant Date: 20170629
Lead Channel Impedance Value: 390 Ohm
Lead Channel Impedance Value: 510 Ohm
Lead Channel Pacing Threshold Amplitude: 0.5 V
Lead Channel Pacing Threshold Pulse Width: 0.4 ms
Lead Channel Pacing Threshold Pulse Width: 0.4 ms
Lead Channel Sensing Intrinsic Amplitude: 1.3 mV
MDC IDC LEAD IMPLANT DT: 20080909
MDC IDC LEAD IMPLANT DT: 20170629
MDC IDC LEAD LOCATION: 753859
MDC IDC MSMT BATTERY REMAINING LONGEVITY: 116 mo
MDC IDC MSMT LEADCHNL RV PACING THRESHOLD AMPLITUDE: 1.25 V
MDC IDC MSMT LEADCHNL RV SENSING INTR AMPL: 12 mV
MDC IDC PG SERIAL: 7916025
MDC IDC SET LEADCHNL RA PACING AMPLITUDE: 2 V
MDC IDC SET LEADCHNL RV PACING AMPLITUDE: 2.5 V
MDC IDC SET LEADCHNL RV PACING PULSEWIDTH: 0.4 ms
MDC IDC SET LEADCHNL RV SENSING SENSITIVITY: 2 mV
MDC IDC STAT BRADY AP VS PERCENT: 57 %
MDC IDC STAT BRADY AS VP PERCENT: 1 %
Pulse Gen Model: 2272

## 2017-03-31 ENCOUNTER — Other Ambulatory Visit: Payer: Self-pay | Admitting: Internal Medicine

## 2017-06-01 ENCOUNTER — Ambulatory Visit (INDEPENDENT_AMBULATORY_CARE_PROVIDER_SITE_OTHER): Payer: BLUE CROSS/BLUE SHIELD | Admitting: Internal Medicine

## 2017-06-01 ENCOUNTER — Encounter: Payer: Self-pay | Admitting: Internal Medicine

## 2017-06-01 VITALS — BP 138/88 | HR 85 | Ht 71.0 in | Wt 215.0 lb

## 2017-06-01 DIAGNOSIS — I10 Essential (primary) hypertension: Secondary | ICD-10-CM | POA: Diagnosis not present

## 2017-06-01 DIAGNOSIS — J302 Other seasonal allergic rhinitis: Secondary | ICD-10-CM

## 2017-06-01 DIAGNOSIS — J069 Acute upper respiratory infection, unspecified: Secondary | ICD-10-CM | POA: Diagnosis not present

## 2017-06-01 DIAGNOSIS — J309 Allergic rhinitis, unspecified: Secondary | ICD-10-CM

## 2017-06-01 DIAGNOSIS — R7302 Impaired glucose tolerance (oral): Secondary | ICD-10-CM | POA: Diagnosis not present

## 2017-06-01 MED ORDER — BENZONATATE 100 MG PO CAPS: 100.0000 mg | ORAL_CAPSULE | Freq: Two times a day (BID) | ORAL | 0 refills | Status: DC | PRN

## 2017-06-01 MED ORDER — METHYLPREDNISOLONE ACETATE 80 MG/ML IJ SUSP
80.0000 mg | Freq: Once | INTRAMUSCULAR | Status: AC
  Administered 2017-06-01: 80 mg via INTRAMUSCULAR

## 2017-06-01 MED ORDER — PREDNISONE 10 MG PO TABS: ORAL_TABLET | ORAL | 0 refills | Status: DC

## 2017-06-01 MED ORDER — AZITHROMYCIN 250 MG PO TABS: ORAL_TABLET | ORAL | 1 refills | Status: DC

## 2017-06-01 MED ORDER — BENZONATATE 100 MG PO CAPS: ORAL_CAPSULE | ORAL | 0 refills | Status: DC

## 2017-06-01 NOTE — Assessment & Plan Note (Signed)
Mild to mod, for antibx course,  to f/u any worsening symptoms or concerns 

## 2017-06-01 NOTE — Patient Instructions (Signed)
You had the steroid shot today  Please take all new medication as prescribed - the antibiotic, cough pills if needed and prednisone for swelling  Please continue all other medications as before, and refills have been done if requested.  Please have the pharmacy call with any other refills you may need.  Please continue your efforts at being more active, low cholesterol diet, and weight control.  Please keep your appointments with your specialists as you may have planned

## 2017-06-01 NOTE — Assessment & Plan Note (Signed)
stable overall by history and exam, recent data reviewed with pt, and pt to continue medical treatment as before,  to f/u any worsening symptoms or concerns BP Readings from Last 3 Encounters:  06/01/17 138/88  02/25/17 132/88  09/23/16 122/80

## 2017-06-01 NOTE — Progress Notes (Signed)
Subjective:    Patient ID: [name], male    DOB: [birth year], 51 y.o.   MRN: [id number]  HPI   Here with 2-3 days acute onset fever, facial pain, pressure, headache, general weakness and malaise, and greenish d/c, with mild ST and cough worse at night but pt denies chest pain, wheezing, increased sob or doe, orthopnea, PND, increased LE swelling, palpitations, dizziness or syncope. Does have several wks ongoing nasal allergy symptoms with clearish congestion, itch and sneezing, without fever, pain, ST, cough, swelling or wheezing.   Pt denies polydipsia, polyuria.   Past Medical History:  Diagnosis Date  . Atrial fibrillation (HCC)   . Atrial lead impedance-intermittently low 05/30/2012  . CAD, NATIVE VESSEL 09/11/2009  . DISC DISEASE, LUMBAR   . GERD (gastroesophageal reflux disease)   . Glucose intolerance (impaired glucose tolerance)   . H/O: Bell's palsy   . Hiatal hernia   . Hyperlipidemia   . HYPERSOMNIA, ASSOCIATED WITH SLEEP APNEA   . Hypertension   . LIBIDO, DECREASED   . Lumbar disc disease   . Myocardial infarction (HCC) 2010  . Pacemaker-st judes 2012  . PULMONARY NODULE 05/03/2008  . Sinoatrial node dysfunction (HCC)   . Syncope    Past Surgical History:  Procedure Laterality Date  . BACK SURGERY    . CARDIAC PACEMAKER PLACEMENT  [address]2008   [facility] [place] [phone]  . CORONARY ANGIOPLASTY WITH STENT PLACEMENT  2010  . EP IMPLANTABLE DEVICE N/A 06/11/2016   Procedure: INSERTION of new right atrial lead and generator device ;  Surgeon: [name], MD;  [facility];  Service: Cardiovascular;  Laterality: N/A;  . LUMBAR LAMINECTOMY  1990s  . PACEMAKER LEAD REMOVAL  06/11/2016   INSERTION of new right atrial lead and generator device (  . PACEMAKER LEAD REMOVAL N/A 06/11/2016   Procedure: PACEMAKER LEAD REMOVAL ;  Surgeon: [name], MD;  [facility];  Service: Cardiovascular;  Laterality: N/A;  . TEE WITHOUT CARDIOVERSION N/A 06/11/2016   Procedure:  TRANSESOPHAGEAL ECHOCARDIOGRAM (TEE);  Surgeon: [name], MD;  [facility];  Service: Cardiovascular;  Laterality: N/A;    reports that he quit smoking about 9 years ago. His smoking use included Cigarettes. He has a 25.00 pack-year smoking history. He has quit using smokeless tobacco. He reports that he drinks about 1.2 oz of alcohol per week . He reports that he does not use drugs. family history includes Coronary artery disease in his other; Diabetes in his maternal grandfather, maternal grandmother, and other; Heart attack in his father and paternal grandfather; Hyperlipidemia in his other; Hypertension in his other. Allergies  Allergen Reactions  . Lisinopril     REACTION: cough   Current Outpatient Prescriptions on File Prior to Visit  Medication Sig Dispense Refill  . aspirin 81 MG tablet Take 81 mg by mouth daily.    Marland Kitchen. losartan (COZAAR) 25 MG tablet Take 1 tablet (25 mg total) by mouth daily. 90 tablet 3  . metoprolol tartrate (LOPRESSOR) 25 MG tablet TAKE ONE TABLET BY MOUTH TWICE DAILY 180 tablet 2  . nitroGLYCERIN (NITROSTAT) 0.4 MG SL tablet Place 0.4 mg under the tongue every 5 (five) minutes as needed for chest pain (MAX 3 TABLETS). Reported on 07/02/2016    . rosuvastatin (CRESTOR) 40 MG tablet Take 1 tablet (40 mg total) by mouth daily. 90 tablet 3  . triamcinolone cream (KENALOG) 0.1 % Apply 1 application topically 2 (two) times daily. 30  g 0   No current facility-administered medications on file prior to visit.    Review of Systems  Constitutional: Negative for other unusual diaphoresis or sweats HENT: Negative for ear discharge or swelling Eyes: Negative for other worsening visual disturbances Respiratory: Negative for stridor or other swelling  Gastrointestinal: Negative for worsening distension or other blood Genitourinary: Negative for retention or other urinary change Musculoskeletal: Negative for other MSK pain or swelling Skin: Negative for color change  or other new lesions Neurological: Negative for worsening tremors and other numbness  Psychiatric/Behavioral: Negative for worsening agitation or other fatigue All other system neg per pt    Objective:   Physical Exam BP 138/88   Pulse 85   Ht 5\' 11"  (1.803 m)   Wt 215 lb (97.5 kg)   SpO2 100%   BMI 29.99 kg/m  VS noted, mild ill Constitutional: Pt appears in NAD HENT: Head: NCAT.  Right Ear: External ear normal.  Left Ear: External ear normal.  Eyes: . Pupils are equal, round, and reactive to light. Conjunctivae are glassy and slightly weepy, and EOM are normal, bilat eyelide upper and lower mild puffiness, NT Bilat tm's with mild erythema.  Max sinus areas mild tender.  Pharynx with mild erythema, no exudate Nose: without d/c or deformity Neck: Neck supple. Gross normal ROM Cardiovascular: Normal rate and regular rhythm.   Pulmonary/Chest: Effort normal and breath sounds without rales or wheezing.  Neurological: Pt is alert. At baseline orientation, motor grossly intact Skin: Skin is warm. No rashes, other new lesions, no LE edema Psychiatric: Pt behavior is normal without agitation  No other exam findings    Assessment & Plan:

## 2017-06-01 NOTE — Assessment & Plan Note (Signed)
Mild to mod, for depomedrol IM 80, predpac asd, to f/u any worsening symptoms or concerns 

## 2017-06-01 NOTE — Assessment & Plan Note (Signed)
stable overall by history and exam, recent data reviewed with pt, and pt to continue medical treatment as before,  to f/u any worsening symptoms or concerns Lab Results  Component Value Date   HGBA1C 5.2 02/25/2016

## 2017-06-14 ENCOUNTER — Ambulatory Visit (INDEPENDENT_AMBULATORY_CARE_PROVIDER_SITE_OTHER): Payer: BLUE CROSS/BLUE SHIELD | Admitting: Cardiovascular Disease

## 2017-06-14 ENCOUNTER — Encounter: Payer: Self-pay | Admitting: Cardiovascular Disease

## 2017-06-14 VITALS — BP 124/80 | HR 64 | Ht 71.0 in | Wt 215.0 lb

## 2017-06-14 DIAGNOSIS — I2510 Atherosclerotic heart disease of native coronary artery without angina pectoris: Secondary | ICD-10-CM | POA: Diagnosis not present

## 2017-06-14 NOTE — Patient Instructions (Signed)

## 2017-06-14 NOTE — Progress Notes (Signed)
Cardiology Office Note Date:  06/14/2017   ID:  Derrick CortiJames D Elizarraraz, DOB 05-Sep-1966, MRN [id number]  PCP:  Corwin LevinsJohn, Mayan W, MD  Cardiologist:  Tonny Bollmanooper, Evens Meno, MD    Chief Complaint  Patient presents with  . Follow-up     History of Present Illness: [name] is a 51 y.o. male who presents for follow-up of coronary artery disease. He initially presented in 2010 with an acute inferior MI treated with complex stenting of the right coronary artery using 2 overlapping drug-eluting stents. He's had preserved LV systolic function. The patient underwent permanent pacemaker placement in 2008 and he has been followed regularly by Dr. Graciela HusbandsKlein since that time.  He is here with his wife today. He has no specific complaints. Plavix was discontinued one year ago and he has appreciated a big improvement in his bruising problems since then. He denies chest pain, chest pressure, or shortness of breath. He feels well and has no exertional symptoms.   Past Medical History:  Diagnosis Date  . Atrial fibrillation (HCC)   . Atrial lead impedance-intermittently low 05/30/2012  . CAD, NATIVE VESSEL 09/11/2009  . DISC DISEASE, LUMBAR   . GERD (gastroesophageal reflux disease)   . Glucose intolerance (impaired glucose tolerance)   . H/O: Bell's palsy   . Hiatal hernia   . Hyperlipidemia   . HYPERSOMNIA, ASSOCIATED WITH SLEEP APNEA   . Hypertension   . LIBIDO, DECREASED   . Lumbar disc disease   . Myocardial infarction (HCC) 2010  . Pacemaker-st judes 2012  . PULMONARY NODULE 05/03/2008  . Sinoatrial node dysfunction (HCC)   . Syncope     Past Surgical History:  Procedure Laterality Date  . BACK SURGERY    . CARDIAC PACEMAKER PLACEMENT  [address]2008   [facility] [place] [phone]  . CORONARY ANGIOPLASTY WITH STENT PLACEMENT  2010  . EP IMPLANTABLE DEVICE N/A 06/11/2016   Procedure: INSERTION of new right atrial lead and generator device ;  Surgeon: [name], MD;  [facility];  Service: Cardiovascular;   Laterality: N/A;  . LUMBAR LAMINECTOMY  1990s  . PACEMAKER LEAD REMOVAL  06/11/2016   INSERTION of new right atrial lead and generator device (  . PACEMAKER LEAD REMOVAL N/A 06/11/2016   Procedure: PACEMAKER LEAD REMOVAL ;  Surgeon: [name], MD;  [facility];  Service: Cardiovascular;  Laterality: N/A;  . TEE WITHOUT CARDIOVERSION N/A 06/11/2016   Procedure: TRANSESOPHAGEAL ECHOCARDIOGRAM (TEE);  Surgeon: [name], MD;  [facility];  Service: Cardiovascular;  Laterality: N/A;    Current Outpatient Prescriptions  Medication Sig Dispense Refill  . aspirin 81 MG tablet Take 81 mg by mouth daily.    Marland Kitchen. losartan (COZAAR) 25 MG tablet Take 1 tablet (25 mg total) by mouth daily. 90 tablet 3  . metoprolol tartrate (LOPRESSOR) 25 MG tablet TAKE ONE TABLET BY MOUTH TWICE DAILY 180 tablet 2  . nitroGLYCERIN (NITROSTAT) 0.4 MG SL tablet Place 0.4 mg under the tongue every 5 (five) minutes as needed for chest pain (MAX 3 TABLETS). Reported on 07/02/2016    . rosuvastatin (CRESTOR) 40 MG tablet Take 1 tablet (40 mg total) by mouth daily. 90 tablet 3   No current facility-administered medications for this visit.     Allergies:   Lisinopril   Social History:  The patient  reports that he quit smoking about 9 years ago. His smoking use included Cigarettes. He has a 25.00 pack-year smoking history. He has quit  using smokeless tobacco. He reports that he drinks about 1.2 oz of alcohol per week . He reports that he does not use drugs.   Family History:  The patient's  family history includes Coronary artery disease in his other; Diabetes in his maternal grandfather, maternal grandmother, and other; Heart attack in his father and paternal grandfather; Hyperlipidemia in his other; Hypertension in his other.    ROS:  Please see the history of present illness.  All other systems are reviewed and negative.    PHYSICAL EXAM: VS:  BP 124/80   Pulse 64   Ht 5\' 11"  (1.803 m)   Wt 215 lb (97.5  kg)   BMI 29.99 kg/m  , BMI Body mass index is 29.99 kg/m. GEN: Well nourished, well developed, in no acute distress  HEENT: normal  Neck: no JVD, no masses. No carotid bruits Cardiac: RRR without murmur or gallop                Respiratory:  clear to auscultation bilaterally, normal work of breathing GI: soft, nontender, nondistended, + BS MS: no deformity or atrophy  Ext: no pretibial edema, pedal pulses 2+= bilaterally Skin: warm and dry, no rash Neuro:  Strength and sensation are intact Psych: euthymic mood, full affect  EKG:  EKG is ordered today. The ekg ordered today shows NSR 65 bpm, age indeterminate inferior infarct  Recent Labs: 02/25/2017: ALT 18; BUN 20; Creatinine, Ser 0.90; Hemoglobin 15.9; Platelets 208.0; Potassium 4.4; Sodium 138; TSH 1.02   Lipid Panel     Component Value Date/Time   CHOL 126 02/25/2017 1711   TRIG 96.0 02/25/2017 1711   HDL 36.60 (L) 02/25/2017 1711   CHOLHDL 3 02/25/2017 1711   VLDL 19.2 02/25/2017 1711   LDLCALC 70 02/25/2017 1711      Wt Readings from Last 3 Encounters:  06/14/17 215 lb (97.5 kg)  06/01/17 215 lb (97.5 kg)  02/25/17 216 lb (98 kg)    ASSESSMENT AND PLAN: 1.  Coronary artery disease, native vessel, without angina: The patient is doing well on his current medical program which includes aspirin, a statin drug, beta blocker, and an ARB. He will follow-up in one year. He has done fine with discontinuation of clopidogrel one year ago.  2. Essential hypertension: Blood pressure is well controlled on losartan and metoprolol.  3. Hyperlipidemia: Treated with a statin drug. Most recent lipids reviewed as above with an LDL cholesterol of 70 mg/dL.  4. Sinus node dysfunction: Status post permanent pacemaker. Followed by Dr. Graciela Husbands.  Current medicines are reviewed with the patient today.  The patient does not have concerns regarding medicines.  Labs/ tests ordered today include:  No orders of the defined types were placed in  this encounter.  Disposition:   FU one year  Signed, Tonny Bollman, MD  06/14/2017 4:51 PM    [facility] [address] [place], [place], [place]  [address] Phone: [phone]; Fax: [phone]

## 2017-06-23 ENCOUNTER — Ambulatory Visit (INDEPENDENT_AMBULATORY_CARE_PROVIDER_SITE_OTHER): Payer: BLUE CROSS/BLUE SHIELD | Admitting: *Deleted

## 2017-06-23 DIAGNOSIS — I495 Sick sinus syndrome: Secondary | ICD-10-CM | POA: Diagnosis not present

## 2017-06-23 NOTE — Progress Notes (Signed)
Remote pacemaker transmission.   

## 2017-06-24 LAB — CUP PACEART REMOTE DEVICE CHECK
Battery Remaining Longevity: 114 mo
Battery Remaining Percentage: 95.5 %
Brady Statistic AP VS Percent: 54 %
Brady Statistic AS VS Percent: 46 %
Implantable Lead Implant Date: 20080909
Implantable Lead Implant Date: 20170629
Lead Channel Impedance Value: 400 Ohm
Lead Channel Impedance Value: 480 Ohm
Lead Channel Pacing Threshold Amplitude: 1.25 V
Lead Channel Pacing Threshold Pulse Width: 0.4 ms
Lead Channel Sensing Intrinsic Amplitude: 1.4 mV
Lead Channel Setting Pacing Pulse Width: 0.4 ms
MDC IDC LEAD LOCATION: 753859
MDC IDC LEAD LOCATION: 753860
MDC IDC MSMT BATTERY VOLTAGE: 3.01 V
MDC IDC MSMT LEADCHNL RA PACING THRESHOLD AMPLITUDE: 0.5 V
MDC IDC MSMT LEADCHNL RA PACING THRESHOLD PULSEWIDTH: 0.4 ms
MDC IDC MSMT LEADCHNL RV SENSING INTR AMPL: 12 mV
MDC IDC PG IMPLANT DT: 20170629
MDC IDC SESS DTM: 20180711060017
MDC IDC SET LEADCHNL RA PACING AMPLITUDE: 2 V
MDC IDC SET LEADCHNL RV PACING AMPLITUDE: 2.5 V
MDC IDC SET LEADCHNL RV SENSING SENSITIVITY: 2 mV
MDC IDC STAT BRADY AP VP PERCENT: 1 %
MDC IDC STAT BRADY AS VP PERCENT: 1 %
MDC IDC STAT BRADY RA PERCENT PACED: 53 %
MDC IDC STAT BRADY RV PERCENT PACED: 1 %
Pulse Gen Model: 2272
Pulse Gen Serial Number: 7916025

## 2017-06-29 ENCOUNTER — Encounter: Payer: Self-pay | Admitting: Cardiology

## 2017-08-31 ENCOUNTER — Other Ambulatory Visit: Payer: Self-pay | Admitting: Internal Medicine

## 2017-09-22 ENCOUNTER — Ambulatory Visit (INDEPENDENT_AMBULATORY_CARE_PROVIDER_SITE_OTHER): Payer: BLUE CROSS/BLUE SHIELD | Admitting: *Deleted

## 2017-09-22 DIAGNOSIS — I495 Sick sinus syndrome: Secondary | ICD-10-CM | POA: Diagnosis not present

## 2017-09-22 NOTE — Progress Notes (Signed)
Remote pacemaker transmission.   

## 2017-09-24 ENCOUNTER — Encounter: Payer: Self-pay | Admitting: Cardiology

## 2017-10-12 LAB — CUP PACEART REMOTE DEVICE CHECK
Battery Voltage: 3.02 V
Brady Statistic AP VP Percent: 1 %
Brady Statistic RA Percent Paced: 54 %
Brady Statistic RV Percent Paced: 1 %
Implantable Lead Implant Date: 20080909
Implantable Lead Implant Date: 20170629
Implantable Lead Location: 753859
Implantable Pulse Generator Implant Date: 20170629
Lead Channel Impedance Value: 390 Ohm
Lead Channel Pacing Threshold Amplitude: 0.5 V
Lead Channel Pacing Threshold Amplitude: 1.25 V
Lead Channel Pacing Threshold Pulse Width: 0.4 ms
Lead Channel Sensing Intrinsic Amplitude: 1.7 mV
Lead Channel Setting Pacing Amplitude: 2.5 V
Lead Channel Setting Pacing Pulse Width: 0.4 ms
Lead Channel Setting Sensing Sensitivity: 2 mV
MDC IDC LEAD LOCATION: 753860
MDC IDC MSMT BATTERY REMAINING LONGEVITY: 123 mo
MDC IDC MSMT BATTERY REMAINING PERCENTAGE: 95.5 %
MDC IDC MSMT LEADCHNL RA IMPEDANCE VALUE: 510 Ohm
MDC IDC MSMT LEADCHNL RA PACING THRESHOLD PULSEWIDTH: 0.4 ms
MDC IDC MSMT LEADCHNL RV SENSING INTR AMPL: 12 mV
MDC IDC PG SERIAL: 7916025
MDC IDC SESS DTM: 20181010060013
MDC IDC SET LEADCHNL RA PACING AMPLITUDE: 2 V
MDC IDC STAT BRADY AP VS PERCENT: 54 %
MDC IDC STAT BRADY AS VP PERCENT: 1 %
MDC IDC STAT BRADY AS VS PERCENT: 46 %

## 2017-12-22 ENCOUNTER — Ambulatory Visit (INDEPENDENT_AMBULATORY_CARE_PROVIDER_SITE_OTHER): Payer: BLUE CROSS/BLUE SHIELD | Admitting: *Deleted

## 2017-12-22 DIAGNOSIS — I495 Sick sinus syndrome: Secondary | ICD-10-CM

## 2017-12-22 NOTE — Progress Notes (Signed)
Remote pacemaker transmission.   

## 2017-12-23 ENCOUNTER — Encounter: Payer: Self-pay | Admitting: Cardiology

## 2017-12-27 LAB — CUP PACEART REMOTE DEVICE CHECK
Battery Remaining Longevity: 123 mo
Brady Statistic AP VS Percent: 55 %
Brady Statistic AS VP Percent: 1 %
Brady Statistic AS VS Percent: 45 %
Implantable Lead Implant Date: 20080909
Implantable Lead Location: 753859
Lead Channel Impedance Value: 530 Ohm
Lead Channel Pacing Threshold Amplitude: 1.25 V
Lead Channel Pacing Threshold Pulse Width: 0.4 ms
Lead Channel Sensing Intrinsic Amplitude: 1.6 mV
Lead Channel Sensing Intrinsic Amplitude: 12 mV
Lead Channel Setting Pacing Amplitude: 2 V
Lead Channel Setting Pacing Amplitude: 2.5 V
Lead Channel Setting Pacing Pulse Width: 0.4 ms
MDC IDC LEAD IMPLANT DT: 20170629
MDC IDC LEAD LOCATION: 753860
MDC IDC MSMT BATTERY REMAINING PERCENTAGE: 95.5 %
MDC IDC MSMT BATTERY VOLTAGE: 3.01 V
MDC IDC MSMT LEADCHNL RA PACING THRESHOLD AMPLITUDE: 0.5 V
MDC IDC MSMT LEADCHNL RA PACING THRESHOLD PULSEWIDTH: 0.4 ms
MDC IDC MSMT LEADCHNL RV IMPEDANCE VALUE: 400 Ohm
MDC IDC PG IMPLANT DT: 20170629
MDC IDC SESS DTM: 20190109084313
MDC IDC SET LEADCHNL RV SENSING SENSITIVITY: 2 mV
MDC IDC STAT BRADY AP VP PERCENT: 1 %
MDC IDC STAT BRADY RA PERCENT PACED: 54 %
MDC IDC STAT BRADY RV PERCENT PACED: 1 %
Pulse Gen Model: 2272
Pulse Gen Serial Number: 7916025

## 2018-01-07 ENCOUNTER — Other Ambulatory Visit: Payer: Self-pay | Admitting: Internal Medicine

## 2018-03-18 ENCOUNTER — Other Ambulatory Visit: Payer: Self-pay | Admitting: Internal Medicine

## 2018-03-23 ENCOUNTER — Ambulatory Visit (INDEPENDENT_AMBULATORY_CARE_PROVIDER_SITE_OTHER): Payer: BLUE CROSS/BLUE SHIELD | Admitting: *Deleted

## 2018-03-23 DIAGNOSIS — I495 Sick sinus syndrome: Secondary | ICD-10-CM | POA: Diagnosis not present

## 2018-03-23 NOTE — Progress Notes (Signed)
Remote pacemaker transmission.   

## 2018-03-24 ENCOUNTER — Encounter: Payer: Self-pay | Admitting: Cardiology

## 2018-03-31 ENCOUNTER — Other Ambulatory Visit: Payer: Self-pay | Admitting: Internal Medicine

## 2018-04-22 LAB — CUP PACEART REMOTE DEVICE CHECK
Battery Voltage: 3.01 V
Brady Statistic AP VP Percent: 1 %
Brady Statistic AP VS Percent: 55 %
Brady Statistic AS VP Percent: 1 %
Brady Statistic AS VS Percent: 45 %
Brady Statistic RV Percent Paced: 1 %
Date Time Interrogation Session: 20190410060014
Implantable Lead Implant Date: 20080909
Implantable Lead Location: 753859
Implantable Pulse Generator Implant Date: 20170629
Lead Channel Impedance Value: 390 Ohm
Lead Channel Pacing Threshold Amplitude: 0.5 V
Lead Channel Pacing Threshold Amplitude: 1.25 V
Lead Channel Pacing Threshold Pulse Width: 0.4 ms
Lead Channel Pacing Threshold Pulse Width: 0.4 ms
Lead Channel Sensing Intrinsic Amplitude: 1.1 mV
Lead Channel Setting Pacing Amplitude: 2.5 V
Lead Channel Setting Pacing Pulse Width: 0.4 ms
Lead Channel Setting Sensing Sensitivity: 2 mV
MDC IDC LEAD IMPLANT DT: 20170629
MDC IDC LEAD LOCATION: 753860
MDC IDC MSMT BATTERY REMAINING LONGEVITY: 122 mo
MDC IDC MSMT BATTERY REMAINING PERCENTAGE: 95.5 %
MDC IDC MSMT LEADCHNL RA IMPEDANCE VALUE: 510 Ohm
MDC IDC MSMT LEADCHNL RV SENSING INTR AMPL: 12 mV
MDC IDC PG SERIAL: 7916025
MDC IDC SET LEADCHNL RA PACING AMPLITUDE: 2 V
MDC IDC STAT BRADY RA PERCENT PACED: 54 %

## 2018-06-13 ENCOUNTER — Other Ambulatory Visit: Payer: Self-pay | Admitting: Internal Medicine

## 2018-06-14 ENCOUNTER — Encounter: Payer: Self-pay | Admitting: Physician Assistant

## 2018-06-14 ENCOUNTER — Encounter: Payer: Self-pay | Admitting: Internal Medicine

## 2018-06-14 ENCOUNTER — Ambulatory Visit (INDEPENDENT_AMBULATORY_CARE_PROVIDER_SITE_OTHER): Payer: BLUE CROSS/BLUE SHIELD | Admitting: Physician Assistant

## 2018-06-14 ENCOUNTER — Encounter (INDEPENDENT_AMBULATORY_CARE_PROVIDER_SITE_OTHER): Payer: Self-pay

## 2018-06-14 ENCOUNTER — Ambulatory Visit (INDEPENDENT_AMBULATORY_CARE_PROVIDER_SITE_OTHER): Payer: BLUE CROSS/BLUE SHIELD | Admitting: Internal Medicine

## 2018-06-14 VITALS — BP 120/82 | HR 63 | Ht 71.0 in | Wt 219.0 lb

## 2018-06-14 DIAGNOSIS — I480 Paroxysmal atrial fibrillation: Secondary | ICD-10-CM

## 2018-06-14 DIAGNOSIS — I2510 Atherosclerotic heart disease of native coronary artery without angina pectoris: Secondary | ICD-10-CM

## 2018-06-14 DIAGNOSIS — E785 Hyperlipidemia, unspecified: Secondary | ICD-10-CM

## 2018-06-14 DIAGNOSIS — I10 Essential (primary) hypertension: Secondary | ICD-10-CM | POA: Diagnosis not present

## 2018-06-14 DIAGNOSIS — Z950 Presence of cardiac pacemaker: Secondary | ICD-10-CM

## 2018-06-14 DIAGNOSIS — I495 Sick sinus syndrome: Secondary | ICD-10-CM | POA: Diagnosis not present

## 2018-06-14 LAB — CUP PACEART INCLINIC DEVICE CHECK
Implantable Lead Implant Date: 20170629
Implantable Pulse Generator Implant Date: 20170629
Lead Channel Impedance Value: 487.5 Ohm
Lead Channel Pacing Threshold Amplitude: 0.5 V
Lead Channel Pacing Threshold Amplitude: 0.5 V
Lead Channel Pacing Threshold Amplitude: 1 V
Lead Channel Pacing Threshold Amplitude: 1 V
Lead Channel Pacing Threshold Pulse Width: 0.4 ms
Lead Channel Sensing Intrinsic Amplitude: 1.2 mV
Lead Channel Sensing Intrinsic Amplitude: 12 mV
Lead Channel Setting Pacing Amplitude: 2 V
Lead Channel Setting Pacing Pulse Width: 0.4 ms
MDC IDC LEAD IMPLANT DT: 20080909
MDC IDC LEAD LOCATION: 753859
MDC IDC LEAD LOCATION: 753860
MDC IDC MSMT BATTERY REMAINING LONGEVITY: 134 mo
MDC IDC MSMT BATTERY VOLTAGE: 3.01 V
MDC IDC MSMT LEADCHNL RA PACING THRESHOLD PULSEWIDTH: 0.4 ms
MDC IDC MSMT LEADCHNL RA PACING THRESHOLD PULSEWIDTH: 0.4 ms
MDC IDC MSMT LEADCHNL RV IMPEDANCE VALUE: 387.5 Ohm
MDC IDC MSMT LEADCHNL RV PACING THRESHOLD PULSEWIDTH: 0.4 ms
MDC IDC PG SERIAL: 7916025
MDC IDC SESS DTM: 20190702172432
MDC IDC SET LEADCHNL RV PACING AMPLITUDE: 2.5 V
MDC IDC SET LEADCHNL RV SENSING SENSITIVITY: 2 mV
MDC IDC STAT BRADY RA PERCENT PACED: 53 %
MDC IDC STAT BRADY RV PERCENT PACED: 0.1 %

## 2018-06-14 MED ORDER — ROSUVASTATIN CALCIUM 40 MG PO TABS: 40.0000 mg | ORAL_TABLET | Freq: Every day | ORAL | 3 refills | Status: DC

## 2018-06-14 NOTE — Progress Notes (Signed)
Cardiology Office Note:    Date:  06/14/2018   ID:  Derrick CortiJames D Kuechle, DOB [birth year], MRN [id number]  PCP:  Corwin LevinsJohn, Eulice W, MD  Cardiologist:  Tonny BollmanMichael Cooper, MD  Electrophysiologist:  Sherryl MangesSteven Klein, MD     Referring MD: Corwin LevinsJohn, Koltin W, MD   Chief Complaint  Patient presents with  . Follow-up    CAD    History of Present Illness:    [name] is a 52 y.o. male with coronary artery disease status post inferior myocardial infarction in 2010 treated with complex PCI using 2 overlapping drug-eluting stents to the RCA, sinus node dysfunction status post pacemaker implantation, hypertension, hyperlipidemia, paroxysmal atrial fibrillation.  Last seen by Dr. Tonny BollmanMichael Cooper in July 2018.    [name] returns for follow-up on coronary artery disease.  He is here with his wife.  He denies chest pain, shortness of breath, syncope, PND.  He does have dependent pedal edema.  Prior CV studies:   The following studies were reviewed today:  TEE 06/11/16 Mild conc LVH, EF 45-50  Cardiac Catheterization 08/25/2009 LM mid 30 LAD nonobstructive plaque LCx distal 60-70 RCA proximal 100, distal 15 EF 55-60 PCI: 3.5 x 28 mm Xience DES, 3.5 x 23 mm Xience DES to the RCA  Past Medical History:  Diagnosis Date  . Atrial fibrillation (HCC)   . Atrial lead impedance-intermittently low 05/30/2012  . CAD, NATIVE VESSEL 09/11/2009  . DISC DISEASE, LUMBAR   . GERD (gastroesophageal reflux disease)   . Glucose intolerance (impaired glucose tolerance)   . H/O: Bell's palsy   . Hiatal hernia   . Hyperlipidemia   . HYPERSOMNIA, ASSOCIATED WITH SLEEP APNEA   . Hypertension   . LIBIDO, DECREASED   . Lumbar disc disease   . Myocardial infarction (HCC) 2010  . Pacemaker-st judes 2012  . PULMONARY NODULE 05/03/2008  . Sinoatrial node dysfunction (HCC)   . Syncope    Surgical Hx: The patient  has a past surgical history that includes Lumbar laminectomy (1990s); Pacemaker lead removal (06/11/2016); Cardiac  pacemaker placement (2008); Back surgery; Coronary angioplasty with stent (2010); Pacemaker lead removal (N/A, 06/11/2016); TEE without cardioversion (N/A, 06/11/2016); and Cardiac catheterization (N/A, 06/11/2016).   Current Medications: Current Meds  Medication Sig  . aspirin 81 MG tablet Take 81 mg by mouth daily.  Marland Kitchen. losartan (COZAAR) 25 MG tablet TAKE 1 TABLET BY MOUTH ONCE DAILY  . metoprolol tartrate (LOPRESSOR) 25 MG tablet Take 1 tablet (25 mg total) by mouth 2 (two) times daily.  . nitroGLYCERIN (NITROSTAT) 0.4 MG SL tablet Place 0.4 mg under the tongue every 5 (five) minutes as needed for chest pain (MAX 3 TABLETS). Reported on 07/02/2016  . rosuvastatin (CRESTOR) 40 MG tablet Take 1 tablet (40 mg total) by mouth daily.  . [DISCONTINUED] rosuvastatin (CRESTOR) 40 MG tablet TAKE 1 TABLET BY MOUTH EVERY DAY     Allergies:   Lisinopril   Social History   Tobacco Use  . Smoking status: Former Smoker    Packs/day: 1.00    Years: 25.00    Pack years: 25.00    Types: Cigarettes    Last attempt to quit: 08/15/2007    Years since quitting: 10.8  . Smokeless tobacco: Former Engineer, waterUser  Substance Use Topics  . Alcohol use: Yes    Alcohol/week: 1.2 oz    Types: 2 Shots of liquor per week  . Drug use: No     Family Hx: The patient's family history includes Coronary  artery disease in his other; Diabetes in his maternal grandfather, maternal grandmother, and other; Heart attack in his father and paternal grandfather; Hyperlipidemia in his other; Hypertension in his other.  ROS:   Please see the history of present illness.    ROS All other systems reviewed and are negative.   EKGs/Labs/Other Test Reviewed:    EKG:  EKG is  ordered today.  The ekg ordered today demonstrates normal sinus rhythm, heart rate 63, normal axis, QTC 395, similar to prior tracing dated 06/14/2017  Recent Labs: No results found for requested labs within last 8760 hours.   Recent Lipid Panel Lab Results  Component  Value Date/Time   CHOL 126 02/25/2017 05:11 PM   TRIG 96.0 02/25/2017 05:11 PM   HDL 36.60 (L) 02/25/2017 05:11 PM   CHOLHDL 3 02/25/2017 05:11 PM   LDLCALC 70 02/25/2017 05:11 PM    Physical Exam:    VS:  BP 120/82   Pulse 63   Ht 5\' 11"  (1.803 m)   Wt 219 lb (99.3 kg)   SpO2 100%   BMI 30.54 kg/m     Wt Readings from Last 3 Encounters:  06/14/18 219 lb (99.3 kg)  06/14/17 215 lb (97.5 kg)  06/01/17 215 lb (97.5 kg)     Physical Exam  Constitutional: He is oriented to person, place, and time. He appears well-developed and well-nourished. No distress.  HENT:  Head: Normocephalic and atraumatic.  Neck: Neck supple. No JVD present.  Cardiovascular: Normal rate, regular rhythm, S1 normal and S2 normal.  No murmur heard. Pulmonary/Chest: Breath sounds normal. He has no rales.  Abdominal: Soft. There is no hepatomegaly.  Musculoskeletal: He exhibits no edema.  Neurological: He is alert and oriented to person, place, and time.  Skin: Skin is warm and dry.    ASSESSMENT & PLAN:    Coronary artery disease involving native coronary artery of native heart without angina pectoris History of inferior myocardial infarction in 2010 treated with overlapping drug-eluting stents to the RCA.  He is doing well without anginal symptoms.  Continue current medical regimen which includes aspirin, losartan, metoprolol, rosuvastatin.  Paroxysmal atrial fibrillation (HCC)  CHADS2-VASc=2 (CAD, HTN).  He is not on anticoagulation.  He has had low burden based upon pacemaker interrogation.  This is being followed by Dr. Graciela Husbands.    Essential hypertension The patient's blood pressure is controlled on his current regimen.  Continue current therapy.  Arrange follow-up BMET  Hyperlipidemia, unspecified hyperlipidemia type  Arrange follow-up lipids and LFTs.  Continue rosuvastatin 40 mg daily.  Cardiac pacemaker in situ Continue follow-up with EP as planned.   Dispo:  Return in about 1 year  (around 06/15/2019) for Routine Follow Up, w/ Dr. Excell Seltzer, or Tereso Newcomer, PA-C.   Medication Adjustments/Labs and Tests Ordered: Current medicines are reviewed at length with the patient today.  Concerns regarding medicines are outlined above.  Tests Ordered: Orders Placed This Encounter  Procedures  . Basic Metabolic Panel (BMET)  . Hepatic function panel  . Lipid Profile  . EKG 12-Lead   Medication Changes: Meds ordered this encounter  Medications  . rosuvastatin (CRESTOR) 40 MG tablet    Sig: Take 1 tablet (40 mg total) by mouth daily.    Dispense:  90 tablet    Refill:  3    Signed, Tereso Newcomer, PA-C  06/14/2018 4:35 PM    [facility] [address] [place], [place], [place]  [address] Phone: [phone]; Fax: [phone]

## 2018-06-14 NOTE — Patient Instructions (Addendum)
Medication Instructions:  Your physician recommends that you continue on your current medications as directed. Please refer to the Current Medication list given to you today.  Labwork: None ordered.  Testing/Procedures: None ordered.  Follow-Up: Your physician wants you to follow-up in: One Year with Francis Dowseenee Ursuy, PA. You will receive a reminder letter in the mail two months in advance. If you don't receive a letter, please call our office to schedule the follow-up appointment.  Remote monitoring is used to monitor your Pacemaker from home. This monitoring reduces the number of office visits required to check your device to one time per year. It allows us to keep an eye on the functioning of your device to ensure it is working properly. You are scheduled for a device check from home on 06/22/2018. You may send your transmission at any time that day. If you have a wireless device, the transmission will be sent automatically. After your physician reviews your transmission, you will receive a postcard with your next transmission date.    Any Other Special Instructions Will Be Listed Below (If Applicable).     If you need a refill on your cardiac medications before your next appointment, please call your pharmacy.

## 2018-06-14 NOTE — Progress Notes (Signed)
Patient Care Team: Corwin LevinsJohn, Demetrie W, MD as PCP - Jerelene ReddenGeneral Cooper, Michael, MD as PCP - Cardiology (Cardiology)   HPI  [name] seen in followup for syncope associated with bradycardia and sinus node dysfunction his paroxysmal atrial fibrillation. He is status post pacemaker implantation. His device reached ERI 5/17 the underwent generator replacement with insertion of a new atrial lead because of chronically low atrial impedances and extraction of the previously implanted atrial lead   He also has a history of coronary disease status post drug-eluting stenting to his right coronary artery with near normal left ventricular function.    The patient denies chest pain, shortness of breath, nocturnal dyspnea, orthopnea or peripheral edema.  There have been no palpitations, lightheadedness or syncope.    Past Medical History:  Diagnosis Date  . Atrial fibrillation (HCC)   . Atrial lead impedance-intermittently low 05/30/2012  . CAD, NATIVE VESSEL 09/11/2009  . DISC DISEASE, LUMBAR   . GERD (gastroesophageal reflux disease)   . Glucose intolerance (impaired glucose tolerance)   . H/O: Bell's palsy   . Hiatal hernia   . Hyperlipidemia   . HYPERSOMNIA, ASSOCIATED WITH SLEEP APNEA   . Hypertension   . LIBIDO, DECREASED   . Lumbar disc disease   . Myocardial infarction (HCC) 2010  . Pacemaker-st judes 2012  . PULMONARY NODULE 05/03/2008  . Sinoatrial node dysfunction (HCC)   . Syncope     Past Surgical History:  Procedure Laterality Date  . BACK SURGERY    . CARDIAC PACEMAKER PLACEMENT  [address]2008   St Jude [place] [phone]  . CORONARY ANGIOPLASTY WITH STENT PLACEMENT  2010  . EP IMPLANTABLE DEVICE N/A 06/11/2016   Procedure: INSERTION of new right atrial lead and generator device ;  Surgeon: [name], MD;  [facility];  Service: Cardiovascular;  Laterality: N/A;  . LUMBAR LAMINECTOMY  1990s  . PACEMAKER LEAD REMOVAL  06/11/2016   INSERTION of new right  atrial lead and generator device (  . PACEMAKER LEAD REMOVAL N/A 06/11/2016   Procedure: PACEMAKER LEAD REMOVAL ;  Surgeon: [name], MD;  [facility];  Service: Cardiovascular;  Laterality: N/A;  . TEE WITHOUT CARDIOVERSION N/A 06/11/2016   Procedure: TRANSESOPHAGEAL ECHOCARDIOGRAM (TEE);  Surgeon: [name], MD;  [facility];  Service: Cardiovascular;  Laterality: N/A;    Current Outpatient Medications  Medication Sig Dispense Refill  . aspirin 81 MG tablet Take 81 mg by mouth daily.    Marland Kitchen. losartan (COZAAR) 25 MG tablet TAKE 1 TABLET BY MOUTH ONCE DAILY 90 tablet 0  . metoprolol tartrate (LOPRESSOR) 25 MG tablet Take 1 tablet (25 mg total) by mouth 2 (two) times daily. 180 tablet 1  . nitroGLYCERIN (NITROSTAT) 0.4 MG SL tablet Place 0.4 mg under the tongue every 5 (five) minutes as needed for chest pain (MAX 3 TABLETS). Reported on 07/02/2016    . rosuvastatin (CRESTOR) 40 MG tablet Take 1 tablet (40 mg total) by mouth daily. 90 tablet 3   No current facility-administered medications for this visit.     Allergies  Allergen Reactions  . Lisinopril     REACTION: cough    Review of Systems negative except from HPI and PMH  Physical Exam BP 120/82   Pulse 63   Ht 5\' 11"  (1.803 m)   Wt 219 lb (99.3 kg)   BMI 30.54 kg/m  Well developed and nourished in no acute  distress HENT normal Regular rate and rhythm,   No Clubbing cyanosis edema Skin-warm and dry A & Oriented  Grossly normal sensory and motor function   ECG demonstrates NSR   Assessment and  Plan  Sinus node dysfunction  Atrial fibrillation-paroxysmal  Coronary artery disease with prior DES stenting  Pacemaker-St. Jude  The patient's device was interrogated.  The information was reviewed. No changes were made in the programming.     Atrial impedance-low  replacement  Normal device funciton

## 2018-06-14 NOTE — Patient Instructions (Signed)
Medication Instructions:  1. A REFILL HAS BEEN SENT IN FOR CRESTOR   Labwork: FASTING LIPID, LIVER, BMET TO BE DONE 06/20/18; THIS WILL BE DONE WITH PRIMARY CARE  Testing/Procedures: NONE ORDERED TODAY  Follow-Up: 1 YEAR FOLLOW UP WITH DR. Excell SeltzerOOPER   Any Other Special Instructions Will Be Listed Below (If Applicable).     If you need a refill on your cardiac medications before your next appointment, please call your pharmacy.

## 2018-06-17 ENCOUNTER — Other Ambulatory Visit (INDEPENDENT_AMBULATORY_CARE_PROVIDER_SITE_OTHER): Payer: BLUE CROSS/BLUE SHIELD

## 2018-06-17 ENCOUNTER — Other Ambulatory Visit: Payer: Self-pay | Admitting: Internal Medicine

## 2018-06-17 DIAGNOSIS — E785 Hyperlipidemia, unspecified: Secondary | ICD-10-CM

## 2018-06-17 DIAGNOSIS — I10 Essential (primary) hypertension: Secondary | ICD-10-CM

## 2018-06-17 LAB — LIPID PANEL
CHOLESTEROL: 114 mg/dL (ref 0–200)
HDL: 36.2 mg/dL — ABNORMAL LOW (ref >39.00)
LDL CALC: 65 mg/dL (ref 0–99)
NonHDL: 77.96
Total CHOL/HDL Ratio: 3
Triglycerides: 63 mg/dL (ref 0.0–149.0)
VLDL: 12.6 mg/dL (ref 0.0–40.0)

## 2018-06-17 LAB — BASIC METABOLIC PANEL
BUN: 18 mg/dL (ref 6–23)
CO2: 28 mEq/L (ref 19–32)
CREATININE: 0.85 mg/dL (ref 0.40–1.50)
Calcium: 9.3 mg/dL (ref 8.4–10.5)
Chloride: 107 mEq/L (ref 96–112)
GFR: 99.85 mL/min (ref >60.00)
Glucose, Bld: 112 mg/dL — ABNORMAL HIGH (ref 70–99)
Potassium: 4.4 mEq/L (ref 3.5–5.1)
Sodium: 141 mEq/L (ref 135–145)

## 2018-06-17 LAB — HEPATIC FUNCTION PANEL
ALT: 22 U/L (ref 0–53)
AST: 23 U/L (ref 0–37)
Albumin: 4.3 g/dL (ref 3.5–5.2)
Alkaline Phosphatase: 40 U/L (ref 39–117)
BILIRUBIN TOTAL: 0.8 mg/dL (ref 0.2–1.2)
Bilirubin, Direct: 0.2 mg/dL (ref 0.0–0.3)
Total Protein: 6.9 g/dL (ref 6.0–8.3)

## 2018-06-17 NOTE — Addendum Note (Signed)
Addended by: Trellis PaganiniKITCHENS, Aralynn Brake D on: 06/17/2018 07:47 AM   Modules accepted: Orders

## 2018-06-20 ENCOUNTER — Telehealth: Payer: Self-pay | Admitting: *Deleted

## 2018-06-20 ENCOUNTER — Other Ambulatory Visit (INDEPENDENT_AMBULATORY_CARE_PROVIDER_SITE_OTHER): Payer: BLUE CROSS/BLUE SHIELD

## 2018-06-20 ENCOUNTER — Encounter: Payer: Self-pay | Admitting: Internal Medicine

## 2018-06-20 ENCOUNTER — Ambulatory Visit (INDEPENDENT_AMBULATORY_CARE_PROVIDER_SITE_OTHER): Payer: BLUE CROSS/BLUE SHIELD | Admitting: Internal Medicine

## 2018-06-20 VITALS — BP 124/88 | HR 74 | Temp 98.6°F | Ht 71.0 in | Wt 220.0 lb

## 2018-06-20 DIAGNOSIS — R739 Hyperglycemia, unspecified: Secondary | ICD-10-CM | POA: Diagnosis not present

## 2018-06-20 DIAGNOSIS — Z0000 Encounter for general adult medical examination without abnormal findings: Secondary | ICD-10-CM | POA: Diagnosis not present

## 2018-06-20 LAB — URINALYSIS, ROUTINE W REFLEX MICROSCOPIC
Bilirubin Urine: NEGATIVE
KETONES UR: NEGATIVE
Leukocytes, UA: NEGATIVE
NITRITE: NEGATIVE
PH: 5.5 (ref 5.0–8.0)
Total Protein, Urine: NEGATIVE
Urine Glucose: NEGATIVE
Urobilinogen, UA: 0.2 (ref 0.0–1.0)
WBC UA: NONE SEEN (ref 0–?)

## 2018-06-20 LAB — CBC WITH DIFFERENTIAL/PLATELET
Basophils Absolute: 0.1 10*3/uL (ref 0.0–0.1)
Basophils Relative: 1.5 % (ref 0.0–3.0)
EOS ABS: 0.2 10*3/uL (ref 0.0–0.7)
EOS PCT: 4.3 % (ref 0.0–5.0)
HEMATOCRIT: 44.1 % (ref 39.0–52.0)
HEMOGLOBIN: 15.7 g/dL (ref 13.0–17.0)
LYMPHS PCT: 29.5 % (ref 12.0–46.0)
Lymphs Abs: 1.5 10*3/uL (ref 0.7–4.0)
MCV: 91 fl (ref 78.0–100.0)
MONO ABS: 0.5 10*3/uL (ref 0.1–1.0)
Monocytes Relative: 9.3 % (ref 3.0–12.0)
Neutro Abs: 2.8 10*3/uL (ref 1.4–7.7)
Neutrophils Relative %: 55.4 % (ref 43.0–77.0)
Platelets: 198 10*3/uL (ref 150.0–400.0)
RBC: 4.85 Mil/uL (ref 4.22–5.81)
RDW: 12.6 % (ref 11.5–15.5)
WBC: 5.1 10*3/uL (ref 4.0–10.5)

## 2018-06-20 LAB — HEMOGLOBIN A1C: HEMOGLOBIN A1C: 5.2 % (ref 4.6–6.5)

## 2018-06-20 LAB — TSH: TSH: 1.74 u[IU]/mL (ref 0.35–4.50)

## 2018-06-20 LAB — PSA: PSA: 0.82 ng/mL (ref 0.10–4.00)

## 2018-06-20 MED ORDER — METOPROLOL TARTRATE 25 MG PO TABS: 25.0000 mg | ORAL_TABLET | Freq: Two times a day (BID) | ORAL | 3 refills | Status: DC

## 2018-06-20 MED ORDER — ROSUVASTATIN CALCIUM 40 MG PO TABS: 40.0000 mg | ORAL_TABLET | Freq: Every day | ORAL | 3 refills | Status: DC

## 2018-06-20 MED ORDER — LOSARTAN POTASSIUM 25 MG PO TABS: 25.0000 mg | ORAL_TABLET | Freq: Every day | ORAL | 3 refills | Status: DC

## 2018-06-20 NOTE — Patient Instructions (Signed)

## 2018-06-20 NOTE — Telephone Encounter (Signed)
-----   Message from Beatrice LecherScott T Weaver, [place] JerseyPA-C sent at 06/19/2018  8:55 PM EDT ----- Renal function, potassium normal.  LFTs normal.  Lipids at goal. Continue current medications and follow up as planned.  Tereso NewcomerScott Weaver, PA-C    06/19/2018 8:55 PM

## 2018-06-20 NOTE — Assessment & Plan Note (Signed)

## 2018-06-20 NOTE — Progress Notes (Signed)
Subjective:    Patient ID: [name], male    DOB: [birth year], 52 y.o.   MRN: [id number]  HPI  Here for wellness and f/u;  Overall doing ok;  Pt denies Chest pain, worsening SOB, DOE, wheezing, orthopnea, PND, worsening LE edema, palpitations, dizziness or syncope.  Pt denies neurological change such as new headache, facial or extremity weakness.  Pt denies polydipsia, polyuria, or low sugar symptoms. Pt states overall good compliance with treatment and medications, good tolerability, and has been trying to follow appropriate diet.  Pt denies worsening depressive symptoms, suicidal ideation or panic. No fever, night sweats, wt loss, loss of appetite, or other constitutional symptoms.  Pt states good ability with ADL's, has low fall risk, home safety reviewed and adequate, no other significant changes in hearing or vision, and only occasionally active with exercise.  Has some stasis dermatitis brownish discoloration to the pretibial areas bilat worse recently Past Medical History:  Diagnosis Date  . Atrial fibrillation (HCC)   . Atrial lead impedance-intermittently low 05/30/2012  . CAD, NATIVE VESSEL 09/11/2009  . DISC DISEASE, LUMBAR   . GERD (gastroesophageal reflux disease)   . Glucose intolerance (impaired glucose tolerance)   . H/O: Bell's palsy   . Hiatal hernia   . Hyperlipidemia   . HYPERSOMNIA, ASSOCIATED WITH SLEEP APNEA   . Hypertension   . LIBIDO, DECREASED   . Lumbar disc disease   . Myocardial infarction (HCC) 2010  . Pacemaker-st judes 2012  . PULMONARY NODULE 05/03/2008  . Sinoatrial node dysfunction (HCC)   . Syncope    Past Surgical History:  Procedure Laterality Date  . BACK SURGERY    . CARDIAC PACEMAKER PLACEMENT  [address] [place] [phone]  . CORONARY ANGIOPLASTY WITH STENT PLACEMENT  2010  . EP IMPLANTABLE DEVICE N/A 06/11/2016   Procedure: INSERTION of new right atrial lead and generator device ;  Surgeon: Marinus Maw, MD;  Location: [facility] OR;  Service:  Cardiovascular;  Laterality: N/A;  . LUMBAR LAMINECTOMY  1990s  . PACEMAKER LEAD REMOVAL  06/11/2016   INSERTION of new right atrial lead and generator device (  . PACEMAKER LEAD REMOVAL N/A 06/11/2016   Procedure: PACEMAKER LEAD REMOVAL ;  Surgeon: Marinus Maw, MD;  Location: [facility] OR;  Service: Cardiovascular;  Laterality: N/A;  . TEE WITHOUT CARDIOVERSION N/A 06/11/2016   Procedure: TRANSESOPHAGEAL ECHOCARDIOGRAM (TEE);  Surgeon: Marinus Maw, MD;  Location: [facility] OR;  Service: Cardiovascular;  Laterality: N/A;    reports that he quit smoking about 10 years ago. His smoking use included cigarettes. He has a 25.00 pack-year smoking history. He has quit using smokeless tobacco. He reports that he drinks about 1.2 oz of alcohol per week. He reports that he does not use drugs. family history includes Coronary artery disease in his other; Diabetes in his maternal grandfather, maternal grandmother, and other; Heart attack in his father and paternal grandfather; Hyperlipidemia in his other; Hypertension in his other. Allergies  Allergen Reactions  . Lisinopril     REACTION: cough   Current Outpatient Medications on File Prior to Visit  Medication Sig Dispense Refill  . aspirin 81 MG tablet Take 81 mg by mouth daily.    . nitroGLYCERIN (NITROSTAT) 0.4 MG SL tablet Place 0.4 mg under the tongue every 5 (five) minutes as needed for chest pain (MAX 3 TABLETS). Reported on 07/02/2016     No current facility-administered medications on file prior to visit.  Review of Systems Constitutional: Negative for other unusual diaphoresis, sweats, appetite or weight changes HENT: Negative for other worsening hearing loss, ear pain, facial swelling, mouth sores or neck stiffness.   Eyes: Negative for other worsening pain, redness or other visual disturbance.  Respiratory: Negative for other stridor or swelling Cardiovascular: Negative for other palpitations or other chest pain  Gastrointestinal: Negative for  worsening diarrhea or loose stools, blood in stool, distention or other pain Genitourinary: Negative for hematuria, flank pain or other change in urine volume.  Musculoskeletal: Negative for myalgias or other joint swelling.  Skin: Negative for other color change, or other wound or worsening drainage.  Neurological: Negative for other syncope or numbness. Hematological: Negative for other adenopathy or swelling Psychiatric/Behavioral: Negative for hallucinations, other worsening agitation, SI, self-injury, or new decreased concentration All other system neg per pt    Objective:   Physical Exam BP 124/88   Pulse 74   Temp 98.6 F (37 C) (Oral)   Ht 5\' 11"  (1.803 m)   Wt 220 lb (99.8 kg)   SpO2 96%   BMI 30.68 kg/m  VS noted,  Constitutional: Pt is oriented to person, place, and time. Appears well-developed and well-nourished, in no significant distress and comfortable Head: Normocephalic and atraumatic  Eyes: Conjunctivae and EOM are normal. Pupils are equal, round, and reactive to light Right Ear: External ear normal without discharge Left Ear: External ear normal without discharge Nose: Nose without discharge or deformity Mouth/Throat: Oropharynx is without other ulcerations and moist  Neck: Normal range of motion. Neck supple. No JVD present. No tracheal deviation present or significant neck LA or mass Cardiovascular: Normal rate, regular rhythm, normal heart sounds and intact distal pulses.   Pulmonary/Chest: WOB normal and breath sounds without rales or wheezing  Abdominal: Soft. Bowel sounds are normal. NT. No HSM  Musculoskeletal: Normal range of motion. Exhibits no edema Lymphadenopathy: Has no other cervical adenopathy.  Neurological: Pt is alert and oriented to person, place, and time. Pt has normal reflexes. No cranial nerve deficit. Motor grossly intact, Gait intact Skin: Skin is warm and dry. No rash noted or new ulcerations Psychiatric:  Has normal mood and affect.  Behavior is normal without agitation No other exam findings  Lab Results  Component Value Date   WBC 5.1 02/25/2017   HGB 15.9 02/25/2017   HCT 45.4 02/25/2017   PLT 208.0 02/25/2017   GLUCOSE 112 (H) 06/17/2018   CHOL 114 06/17/2018   TRIG 63.0 06/17/2018   HDL 36.20 (L) 06/17/2018   LDLCALC 65 06/17/2018   ALT 22 06/17/2018   AST 23 06/17/2018   NA 141 06/17/2018   K 4.4 06/17/2018   CL 107 06/17/2018   CREATININE 0.85 06/17/2018   BUN 18 06/17/2018   CO2 28 06/17/2018   TSH 1.02 02/25/2017   PSA 0.63 02/25/2017   INR 1.1 08/25/2009   HGBA1C 5.2 02/25/2016      Assessment & Plan:

## 2018-06-20 NOTE — Telephone Encounter (Signed)
DPR ok to lmom. Left message lab work good. Continue on current Tx plan. Any questions please call [phone].

## 2018-06-20 NOTE — Assessment & Plan Note (Signed)
stable overall by history and exam, recent data reviewed with pt, and pt to continue medical treatment as before,  to f/u any worsening symptoms or concerns Lab Results  Component Value Date   HGBA1C 5.2 02/25/2016

## 2018-06-22 ENCOUNTER — Ambulatory Visit (INDEPENDENT_AMBULATORY_CARE_PROVIDER_SITE_OTHER): Payer: BLUE CROSS/BLUE SHIELD | Admitting: *Deleted

## 2018-06-22 DIAGNOSIS — R55 Syncope and collapse: Secondary | ICD-10-CM

## 2018-06-22 DIAGNOSIS — I480 Paroxysmal atrial fibrillation: Secondary | ICD-10-CM

## 2018-06-22 NOTE — Progress Notes (Signed)
Remote pacemaker transmission.   

## 2018-07-27 LAB — CUP PACEART REMOTE DEVICE CHECK
Brady Statistic AP VP Percent: 1 %
Brady Statistic AP VS Percent: 60 %
Brady Statistic AS VP Percent: 1 %
Brady Statistic RA Percent Paced: 60 %
Implantable Lead Implant Date: 20170629
Implantable Lead Location: 753859
Implantable Lead Location: 753860
Lead Channel Impedance Value: 390 Ohm
Lead Channel Pacing Threshold Amplitude: 1 V
Lead Channel Pacing Threshold Pulse Width: 0.4 ms
Lead Channel Pacing Threshold Pulse Width: 0.4 ms
Lead Channel Sensing Intrinsic Amplitude: 12 mV
Lead Channel Setting Pacing Amplitude: 2 V
Lead Channel Setting Pacing Amplitude: 2.5 V
Lead Channel Setting Pacing Pulse Width: 0.4 ms
MDC IDC LEAD IMPLANT DT: 20080909
MDC IDC MSMT BATTERY REMAINING LONGEVITY: 122 mo
MDC IDC MSMT BATTERY REMAINING PERCENTAGE: 95.5 %
MDC IDC MSMT BATTERY VOLTAGE: 3.01 V
MDC IDC MSMT LEADCHNL RA IMPEDANCE VALUE: 510 Ohm
MDC IDC MSMT LEADCHNL RA PACING THRESHOLD AMPLITUDE: 0.5 V
MDC IDC MSMT LEADCHNL RA SENSING INTR AMPL: 1.7 mV
MDC IDC PG IMPLANT DT: 20170629
MDC IDC SESS DTM: 20190710061617
MDC IDC SET LEADCHNL RV SENSING SENSITIVITY: 2 mV
MDC IDC STAT BRADY AS VS PERCENT: 40 %
MDC IDC STAT BRADY RV PERCENT PACED: 1 %
Pulse Gen Model: 2272
Pulse Gen Serial Number: 7916025

## 2018-08-01 ENCOUNTER — Telehealth: Payer: Self-pay | Admitting: *Deleted

## 2018-08-01 NOTE — Telephone Encounter (Signed)
The below results were received 08/01/18 based on 06/22/18 remote transmission. LMOM to return call to Device Clinic. AF Clinic availability 08/04/18.  Notes recorded by Tonny Bollmanooper, Michael, MD on 08/01/2018 at 6:02 AM EDT Yes - I agree with anticoagulation thx Brett CanalesSteve. ------  Notes recorded by Duke SalviaKlein, Steven C, MD on 07/30/2018 at 12:51 PM EDT Remote reviewed. This remote is abnormal for prolonged > 24 hr of AFib Thromboembolic risk factors (, HTN-1, Vasc disease -1, CHF-1) for a CHADSVASc Score of about 3 Please refer to AFib cliniic Kathlene NovemberMike are you okay with him going on anticoagulation

## 2018-08-02 NOTE — Telephone Encounter (Signed)
[facility] with Ms. Ernest Mallicksley [facility]  informed her of Dr. Koren BoundKleins recommendations for referral over to AF clinic, apt scheduled for 08/04/18@ 3:30pm. Directions and number given to AF clinic.

## 2018-08-02 NOTE — Telephone Encounter (Signed)
-----   Message from Duke SalviaSteven C Klein, MD sent at 07/30/2018 12:51 PM EDT ----- Remote reviewed. This remote is abnormal for prolonged > 24 hr of AFib Thromboembolic risk factors (, HTN-1, Vasc disease -1, CHF-1) for a CHADSVASc Score of about 3 Please refer to AFib cliniic Kathlene NovemberMike are you okay with him going on anticoagulation

## 2018-08-04 ENCOUNTER — Encounter (HOSPITAL_COMMUNITY): Payer: Self-pay | Admitting: Nurse Practitioner

## 2018-08-04 ENCOUNTER — Ambulatory Visit (HOSPITAL_COMMUNITY)
Admission: RE | Admit: 2018-08-04 | Discharge: 2018-08-04 | Disposition: A | Discharge status: Home / Self Care | Payer: BLUE CROSS/BLUE SHIELD | Source: Ambulatory Visit | Attending: Nurse Practitioner | Admitting: Nurse Practitioner

## 2018-08-04 VITALS — BP 122/80 | HR 65 | Ht 71.0 in | Wt 218.4 lb

## 2018-08-04 DIAGNOSIS — I252 Old myocardial infarction: Secondary | ICD-10-CM | POA: Insufficient documentation

## 2018-08-04 DIAGNOSIS — Z7982 Long term (current) use of aspirin: Secondary | ICD-10-CM | POA: Diagnosis not present

## 2018-08-04 DIAGNOSIS — I2510 Atherosclerotic heart disease of native coronary artery without angina pectoris: Secondary | ICD-10-CM | POA: Insufficient documentation

## 2018-08-04 DIAGNOSIS — Z7901 Long term (current) use of anticoagulants: Secondary | ICD-10-CM | POA: Insufficient documentation

## 2018-08-04 DIAGNOSIS — Z888 Allergy status to other drugs, medicaments and biological substances status: Secondary | ICD-10-CM | POA: Diagnosis not present

## 2018-08-04 DIAGNOSIS — E785 Hyperlipidemia, unspecified: Secondary | ICD-10-CM | POA: Insufficient documentation

## 2018-08-04 DIAGNOSIS — Z950 Presence of cardiac pacemaker: Secondary | ICD-10-CM | POA: Insufficient documentation

## 2018-08-04 DIAGNOSIS — K449 Diaphragmatic hernia without obstruction or gangrene: Secondary | ICD-10-CM | POA: Insufficient documentation

## 2018-08-04 DIAGNOSIS — G4730 Sleep apnea, unspecified: Secondary | ICD-10-CM | POA: Insufficient documentation

## 2018-08-04 DIAGNOSIS — K219 Gastro-esophageal reflux disease without esophagitis: Secondary | ICD-10-CM | POA: Diagnosis not present

## 2018-08-04 DIAGNOSIS — Z955 Presence of coronary angioplasty implant and graft: Secondary | ICD-10-CM | POA: Insufficient documentation

## 2018-08-04 DIAGNOSIS — I480 Paroxysmal atrial fibrillation: Secondary | ICD-10-CM | POA: Diagnosis not present

## 2018-08-04 DIAGNOSIS — Z87891 Personal history of nicotine dependence: Secondary | ICD-10-CM | POA: Insufficient documentation

## 2018-08-04 DIAGNOSIS — I10 Essential (primary) hypertension: Secondary | ICD-10-CM | POA: Insufficient documentation

## 2018-08-04 DIAGNOSIS — Z79899 Other long term (current) drug therapy: Secondary | ICD-10-CM | POA: Insufficient documentation

## 2018-08-04 MED ORDER — RIVAROXABAN 20 MG PO TABS: 20.0000 mg | ORAL_TABLET | Freq: Every day | ORAL | 0 refills | Status: DC

## 2018-08-04 NOTE — Progress Notes (Signed)
Primary Care Physician: Corwin Levins, MD Referring Physician: Dr. Graciela Husbands Cardiologist: Dr. Gunnar Fusi is a 52 y.o. male with a h/o PPM,  CAD, HTN, that is in the afib clinic for start of anticoagulation per Dr. Graciela Husbands with over 24 hours noted of afib   on device report. Pt is unaware. He in on daily BB.  He is on asa daily for h/o CAD. His wife is with him and takes xarelto for permanent afib.  Today, he denies symptoms of palpitations, chest pain, shortness of breath, orthopnea, PND, lower extremity edema, dizziness, presyncope, syncope, or neurologic sequela. The patient is tolerating medications without difficulties and is otherwise without complaint today.   Past Medical History:  Diagnosis Date  . Atrial fibrillation (HCC)   . Atrial lead impedance-intermittently low 05/30/2012  . CAD, NATIVE VESSEL 09/11/2009  . DISC DISEASE, LUMBAR   . GERD (gastroesophageal reflux disease)   . Glucose intolerance (impaired glucose tolerance)   . H/O: Bell's palsy   . Hiatal hernia   . Hyperlipidemia   . HYPERSOMNIA, ASSOCIATED WITH SLEEP APNEA   . Hypertension   . LIBIDO, DECREASED   . Lumbar disc disease   . Myocardial infarction (HCC) 2010  . Pacemaker-st judes 2012  . PULMONARY NODULE 05/03/2008  . Sinoatrial node dysfunction (HCC)   . Syncope    Past Surgical History:  Procedure Laterality Date  . BACK SURGERY    . CARDIAC PACEMAKER PLACEMENT  [address] [place] [phone]  . CORONARY ANGIOPLASTY WITH STENT PLACEMENT  2010  . EP IMPLANTABLE DEVICE N/A 06/11/2016   Procedure: INSERTION of new right atrial lead and generator device ;  Surgeon: Marinus Maw, MD;  [facility];  Service: Cardiovascular;  Laterality: N/A;  . LUMBAR LAMINECTOMY  1990s  . PACEMAKER LEAD REMOVAL  06/11/2016   INSERTION of new right atrial lead and generator device (  . PACEMAKER LEAD REMOVAL N/A 06/11/2016   Procedure: PACEMAKER LEAD REMOVAL ;  Surgeon: Marinus Maw, MD;  Location: [facility] OR;   Service: Cardiovascular;  Laterality: N/A;  . TEE WITHOUT CARDIOVERSION N/A 06/11/2016   Procedure: TRANSESOPHAGEAL ECHOCARDIOGRAM (TEE);  Surgeon: Marinus Maw, MD;  Location: [facility] OR;  Service: Cardiovascular;  Laterality: N/A;    Current Outpatient Medications  Medication Sig Dispense Refill  . aspirin 81 MG tablet Take 81 mg by mouth daily.    Marland Kitchen losartan (COZAAR) 25 MG tablet Take 1 tablet (25 mg total) by mouth daily. 90 tablet 3  . metoprolol tartrate (LOPRESSOR) 25 MG tablet Take 1 tablet (25 mg total) by mouth 2 (two) times daily. 180 tablet 3  . nitroGLYCERIN (NITROSTAT) 0.4 MG SL tablet Place 0.4 mg under the tongue every 5 (five) minutes as needed for chest pain (MAX 3 TABLETS). Reported on 07/02/2016    . rosuvastatin (CRESTOR) 40 MG tablet Take 1 tablet (40 mg total) by mouth daily. 90 tablet 3  . rivaroxaban (XARELTO) 20 MG TABS tablet Take 1 tablet (20 mg total) by mouth daily with supper. 90 tablet 0   No current facility-administered medications for this encounter.     Allergies  Allergen Reactions  . Lisinopril     REACTION: cough    Social History   Socioeconomic History  . Marital status: Married    Spouse name: Not on file  . Number of children: Not on file  . Years of education: Not on file  . Highest education level:  Not on file  Occupational History  . Not on file  Social Needs  . Financial resource strain: Not on file  . Food insecurity:    Worry: Not on file    Inability: Not on file  . Transportation needs:    Medical: Not on file    Non-medical: Not on file  Tobacco Use  . Smoking status: Former Smoker    Packs/day: 1.00    Years: 25.00    Pack years: 25.00    Types: Cigarettes    Last attempt to quit: 08/15/2007    Years since quitting: 10.9  . Smokeless tobacco: Former Engineer, water and Sexual Activity  . Alcohol use: Yes    Alcohol/week: 2.0 standard drinks    Types: 2 Shots of liquor per week  . Drug use: No  . Sexual activity: Yes    Lifestyle  . Physical activity:    Days per week: Not on file    Minutes per session: Not on file  . Stress: Not on file  Relationships  . Social connections:    Talks on phone: Not on file    Gets together: Not on file    Attends religious service: Not on file    Active member of club or organization: Not on file    Attends meetings of clubs or organizations: Not on file    Relationship status: Not on file  . Intimate partner violence:    Fear of current or ex partner: Not on file    Emotionally abused: Not on file    Physically abused: Not on file    Forced sexual activity: Not on file  Other Topics Concern  . Not on file  Social History Narrative  . Not on file    Family History  Problem Relation Age of Onset  . Heart attack Father        MI in early 48s  . Diabetes Maternal Grandmother   . Diabetes Maternal Grandfather   . Heart attack Paternal Grandfather   . Coronary artery disease Other   . Diabetes Other   . Hypertension Other   . Hyperlipidemia Other     ROS- All systems are reviewed and negative except as per the HPI above  Physical Exam: Vitals:   08/04/18 1536  BP: 122/80  Pulse: 65  Weight: 99.1 kg  Height: 5\' 11"  (1.803 m)   Wt Readings from Last 3 Encounters:  08/04/18 99.1 kg  06/20/18 99.8 kg  06/14/18 99.3 kg    Labs: Lab Results  Component Value Date   NA 141 06/17/2018   K 4.4 06/17/2018   CL 107 06/17/2018   CO2 28 06/17/2018   GLUCOSE 112 (H) 06/17/2018   BUN 18 06/17/2018   CREATININE 0.85 06/17/2018   CALCIUM 9.3 06/17/2018   MG 2.1 08/26/2009   Lab Results  Component Value Date   INR 1.1 08/25/2009   Lab Results  Component Value Date   CHOL 114 06/17/2018   HDL 36.20 (L) 06/17/2018   LDLCALC 65 06/17/2018   TRIG 63.0 06/17/2018     GEN- The patient is well appearing, alert and oriented x 3 today.   Head- normocephalic, atraumatic Eyes-  Sclera clear, conjunctiva pink Ears- hearing intact Oropharynx-  clear Neck- supple, no JVP Lymph- no cervical lymphadenopathy Lungs- Clear to ausculation bilaterally, normal work of breathing Heart- Regular rate and rhythm, no murmurs, rubs or gallops, PMI not laterally displaced GI- soft, NT, ND, + BS Extremities- no  clubbing, cyanosis, or edema MS- no significant deformity or atrophy Skin- no rash or lesion Psych- euthymic mood, full affect Neuro- strength and sensation are intact  EKG-NSR at 63 bpm, LAD Paceart monitoring messgaes reviewed  Assessment and Plan: 1. Paroxysmal afib He is SR today Is not aware of afib episodes General education re afib Continue metoprolol 25 mg bid  2. CHA2DS2VASc score of at least 2 Denies bleeding history Discussed available options for anticoagulation Wife is on xarelto and will start him on drug as well, 20 mg daily  Bleeding precautions discussed  Will message Dr. Excell Seltzerooper to see if he wants to continue ASA for h/o remote stents/CAD  F/u in one month  with CBC/Bmet  Lupita LeashDonna C. Matthew Folksarroll, ANP-C [facility] [address] [place], [place] [address] [phone]

## 2018-09-05 ENCOUNTER — Encounter (HOSPITAL_COMMUNITY): Payer: Self-pay | Admitting: Nurse Practitioner

## 2018-09-05 ENCOUNTER — Ambulatory Visit (HOSPITAL_COMMUNITY)
Admission: RE | Admit: 2018-09-05 | Discharge: 2018-09-05 | Disposition: A | Discharge status: Home / Self Care | Payer: BLUE CROSS/BLUE SHIELD | Source: Ambulatory Visit | Attending: Nurse Practitioner | Admitting: Nurse Practitioner

## 2018-09-05 VITALS — BP 118/76 | HR 76 | Ht 71.0 in | Wt 218.0 lb

## 2018-09-05 DIAGNOSIS — I480 Paroxysmal atrial fibrillation: Secondary | ICD-10-CM | POA: Diagnosis not present

## 2018-09-05 DIAGNOSIS — K219 Gastro-esophageal reflux disease without esophagitis: Secondary | ICD-10-CM | POA: Diagnosis not present

## 2018-09-05 DIAGNOSIS — I2510 Atherosclerotic heart disease of native coronary artery without angina pectoris: Secondary | ICD-10-CM | POA: Insufficient documentation

## 2018-09-05 DIAGNOSIS — Z833 Family history of diabetes mellitus: Secondary | ICD-10-CM | POA: Insufficient documentation

## 2018-09-05 DIAGNOSIS — G4730 Sleep apnea, unspecified: Secondary | ICD-10-CM | POA: Diagnosis not present

## 2018-09-05 DIAGNOSIS — Z79899 Other long term (current) drug therapy: Secondary | ICD-10-CM | POA: Diagnosis not present

## 2018-09-05 DIAGNOSIS — Z888 Allergy status to other drugs, medicaments and biological substances status: Secondary | ICD-10-CM | POA: Insufficient documentation

## 2018-09-05 DIAGNOSIS — Z955 Presence of coronary angioplasty implant and graft: Secondary | ICD-10-CM | POA: Diagnosis not present

## 2018-09-05 DIAGNOSIS — E785 Hyperlipidemia, unspecified: Secondary | ICD-10-CM | POA: Diagnosis not present

## 2018-09-05 DIAGNOSIS — I10 Essential (primary) hypertension: Secondary | ICD-10-CM | POA: Insufficient documentation

## 2018-09-05 DIAGNOSIS — Z8249 Family history of ischemic heart disease and other diseases of the circulatory system: Secondary | ICD-10-CM | POA: Diagnosis not present

## 2018-09-05 DIAGNOSIS — Z950 Presence of cardiac pacemaker: Secondary | ICD-10-CM | POA: Insufficient documentation

## 2018-09-05 DIAGNOSIS — Z87891 Personal history of nicotine dependence: Secondary | ICD-10-CM | POA: Diagnosis not present

## 2018-09-05 DIAGNOSIS — I252 Old myocardial infarction: Secondary | ICD-10-CM | POA: Insufficient documentation

## 2018-09-05 DIAGNOSIS — Z7901 Long term (current) use of anticoagulants: Secondary | ICD-10-CM | POA: Diagnosis not present

## 2018-09-05 DIAGNOSIS — Z9889 Other specified postprocedural states: Secondary | ICD-10-CM | POA: Insufficient documentation

## 2018-09-05 LAB — CBC
HEMATOCRIT: 43.5 % (ref 39.0–52.0)
Hemoglobin: 15.6 g/dL (ref 13.0–17.0)
MCH: 32.4 pg (ref 26.0–34.0)
MCHC: 35.9 g/dL (ref 30.0–36.0)
MCV: 90.4 fL (ref 78.0–100.0)
Platelets: 205 10*3/uL (ref 150–400)
RBC: 4.81 MIL/uL (ref 4.22–5.81)
RDW: 11.6 % (ref 11.5–15.5)
WBC: 4.9 10*3/uL (ref 4.0–10.5)

## 2018-09-05 MED ORDER — RIVAROXABAN 20 MG PO TABS: 20.0000 mg | ORAL_TABLET | Freq: Every day | ORAL | 6 refills | Status: DC

## 2018-09-05 NOTE — Progress Notes (Signed)
Primary Care Physician: Corwin LevinsJohn, Yu W, MD Referring Physician: Dr. Graciela HusbandsKlein Cardiologist: Dr. Gunnar Fusiooper   [name] is a 52 y.o. male with a h/o PPM,  CAD, HTN, that is in the afib clinic for start of anticoagulation per Dr. Graciela HusbandsKlein with over 24 hours noted of afib  on device report. Pt is unaware. He in on daily BB.  He is on asa daily for h/o CAD. His wife is with him and takes xarelto for permanent afib. HIs EKG shows SR.  F/u in afib clinic 9/23. He states no issues with the start of xarelto, no bleeding issues. He has not noted any heart irregularity.CBC today.   Today, he denies symptoms of palpitations, chest pain, shortness of breath, orthopnea, PND, lower extremity edema, dizziness, presyncope, syncope, or neurologic sequela. The patient is tolerating medications without difficulties and is otherwise without complaint today.   Past Medical History:  Diagnosis Date  . Atrial fibrillation (HCC)   . Atrial lead impedance-intermittently low 05/30/2012  . CAD, NATIVE VESSEL 09/11/2009  . DISC DISEASE, LUMBAR   . GERD (gastroesophageal reflux disease)   . Glucose intolerance (impaired glucose tolerance)   . H/O: Bell's palsy   . Hiatal hernia   . Hyperlipidemia   . HYPERSOMNIA, ASSOCIATED WITH SLEEP APNEA   . Hypertension   . LIBIDO, DECREASED   . Lumbar disc disease   . Myocardial infarction (HCC) 2010  . Pacemaker-st judes 2012  . PULMONARY NODULE 05/03/2008  . Sinoatrial node dysfunction (HCC)   . Syncope    Past Surgical History:  Procedure Laterality Date  . BACK SURGERY    . CARDIAC PACEMAKER PLACEMENT  [address]2008   [facility] [phone]  . CORONARY ANGIOPLASTY WITH STENT PLACEMENT  2010  . EP IMPLANTABLE DEVICE N/A 06/11/2016   Procedure: INSERTION of new right atrial lead and generator device ;  Surgeon: [name], MD;  [facility];  Service: Cardiovascular;  Laterality: N/A;  . LUMBAR LAMINECTOMY  1990s  . PACEMAKER LEAD REMOVAL  06/11/2016   INSERTION of new right  atrial lead and generator device (  . PACEMAKER LEAD REMOVAL N/A 06/11/2016   Procedure: PACEMAKER LEAD REMOVAL ;  Surgeon: [name], MD;  [facility];  Service: Cardiovascular;  Laterality: N/A;  . TEE WITHOUT CARDIOVERSION N/A 06/11/2016   Procedure: TRANSESOPHAGEAL ECHOCARDIOGRAM (TEE);  Surgeon: [name], MD;  [facility];  Service: Cardiovascular;  Laterality: N/A;    Current Outpatient Medications  Medication Sig Dispense Refill  . Glucosamine-Chondroit-Vit C-Mn (GLUCOSAMINE 1500 COMPLEX PO) Take by mouth.    . losartan (COZAAR) 25 MG tablet Take 1 tablet (25 mg total) by mouth daily. 90 tablet 3  . metoprolol tartrate (LOPRESSOR) 25 MG tablet Take 1 tablet (25 mg total) by mouth 2 (two) times daily. 180 tablet 3  . nitroGLYCERIN (NITROSTAT) 0.4 MG SL tablet Place 0.4 mg under the tongue every 5 (five) minutes as needed for chest pain (MAX 3 TABLETS). Reported on 07/02/2016    . rivaroxaban (XARELTO) 20 MG TABS tablet Take 1 tablet (20 mg total) by mouth daily with supper. 90 tablet 0  . rosuvastatin (CRESTOR) 40 MG tablet Take 1 tablet (40 mg total) by mouth daily. 90 tablet 3   No current facility-administered medications for this encounter.     Allergies  Allergen Reactions  . Lisinopril     REACTION: cough    Social History   Socioeconomic History  . Marital status: Married  Spouse name: Not on file  . Number of children: Not on file  . Years of education: Not on file  . Highest education level: Not on file  Occupational History  . Not on file  Social Needs  . Financial resource strain: Not on file  . Food insecurity:    Worry: Not on file    Inability: Not on file  . Transportation needs:    Medical: Not on file    Non-medical: Not on file  Tobacco Use  . Smoking status: Former Smoker    Packs/day: 1.00    Years: 25.00    Pack years: 25.00    Types: Cigarettes    Last attempt to quit: 08/15/2007    Years since quitting: 11.0  . Smokeless  tobacco: Former Engineer, water and Sexual Activity  . Alcohol use: Yes    Alcohol/week: 2.0 standard drinks    Types: 2 Shots of liquor per week  . Drug use: No  . Sexual activity: Yes  Lifestyle  . Physical activity:    Days per week: Not on file    Minutes per session: Not on file  . Stress: Not on file  Relationships  . Social connections:    Talks on phone: Not on file    Gets together: Not on file    Attends religious service: Not on file    Active member of club or organization: Not on file    Attends meetings of clubs or organizations: Not on file    Relationship status: Not on file  . Intimate partner violence:    Fear of current or ex partner: Not on file    Emotionally abused: Not on file    Physically abused: Not on file    Forced sexual activity: Not on file  Other Topics Concern  . Not on file  Social History Narrative  . Not on file    Family History  Problem Relation Age of Onset  . Heart attack Father        MI in early 54s  . Diabetes Maternal Grandmother   . Diabetes Maternal Grandfather   . Heart attack Paternal Grandfather   . Coronary artery disease Other   . Diabetes Other   . Hypertension Other   . Hyperlipidemia Other     ROS- All systems are reviewed and negative except as per the HPI above  Physical Exam: Vitals:   09/05/18 1531  BP: 118/76  Pulse: 76  Weight: 98.9 kg  Height: 5\' 11"  (1.803 m)   Wt Readings from Last 3 Encounters:  09/05/18 98.9 kg  08/04/18 99.1 kg  06/20/18 99.8 kg    Labs: Lab Results  Component Value Date   NA 141 06/17/2018   K 4.4 06/17/2018   CL 107 06/17/2018   CO2 28 06/17/2018   GLUCOSE 112 (H) 06/17/2018   BUN 18 06/17/2018   CREATININE 0.85 06/17/2018   CALCIUM 9.3 06/17/2018   MG 2.1 08/26/2009   Lab Results  Component Value Date   INR 1.1 08/25/2009   Lab Results  Component Value Date   CHOL 114 06/17/2018   HDL 36.20 (L) 06/17/2018   LDLCALC 65 06/17/2018   TRIG 63.0 06/17/2018      GEN- The patient is well appearing, alert and oriented x 3 today.   Head- normocephalic, atraumatic Eyes-  Sclera clear, conjunctiva pink Ears- hearing intact Oropharynx- clear Neck- supple, no JVP Lymph- no cervical lymphadenopathy Lungs- Clear to ausculation bilaterally, normal  work of breathing Heart- Regular rate and rhythm, no murmurs, rubs or gallops, PMI not laterally displaced GI- soft, NT, ND, + BS Extremities- no clubbing, cyanosis, or edema MS- no significant deformity or atrophy Skin- no rash or lesion Psych- euthymic mood, full affect Neuro- strength and sensation are intact  EKG-NSR at 76  bpm, LAD   Assessment and Plan: 1. Paroxysmal afib He is SR today Is not aware of afib episodes General education re afib Continue metoprolol 25 mg bid  2. CHA2DS2VASc score of at least 2 Denies bleeding   Continue xarelto 20 mg daily Bleeding precautions discussed  Cbc today  Remote pacer check 10/9 F/u per recall per Dr. Verlon Au  Elvina Sidle. Matthew Folks Afib Clinic [facility] [address] [place], [place] [address] [phone]

## 2018-09-21 ENCOUNTER — Ambulatory Visit (INDEPENDENT_AMBULATORY_CARE_PROVIDER_SITE_OTHER): Payer: BLUE CROSS/BLUE SHIELD | Admitting: *Deleted

## 2018-09-21 DIAGNOSIS — I495 Sick sinus syndrome: Secondary | ICD-10-CM | POA: Diagnosis not present

## 2018-09-22 NOTE — Progress Notes (Signed)
Remote pacemaker transmission.   

## 2018-12-02 LAB — CUP PACEART REMOTE DEVICE CHECK
Battery Remaining Longevity: 121 mo
Battery Remaining Percentage: 95.5 %
Brady Statistic AP VS Percent: 56 %
Brady Statistic AS VP Percent: 1 %
Brady Statistic AS VS Percent: 44 %
Date Time Interrogation Session: 20191009060012
Implantable Lead Implant Date: 20080909
Implantable Lead Location: 753859
Implantable Pulse Generator Implant Date: 20170629
Lead Channel Impedance Value: 480 Ohm
Lead Channel Pacing Threshold Amplitude: 1 V
Lead Channel Pacing Threshold Pulse Width: 0.4 ms
Lead Channel Pacing Threshold Pulse Width: 0.4 ms
Lead Channel Sensing Intrinsic Amplitude: 1.4 mV
Lead Channel Sensing Intrinsic Amplitude: 12 mV
Lead Channel Setting Pacing Amplitude: 2 V
Lead Channel Setting Pacing Amplitude: 2.5 V
Lead Channel Setting Pacing Pulse Width: 0.4 ms
MDC IDC LEAD IMPLANT DT: 20170629
MDC IDC LEAD LOCATION: 753860
MDC IDC MSMT BATTERY VOLTAGE: 3.01 V
MDC IDC MSMT LEADCHNL RA PACING THRESHOLD AMPLITUDE: 0.5 V
MDC IDC MSMT LEADCHNL RV IMPEDANCE VALUE: 380 Ohm
MDC IDC SET LEADCHNL RV SENSING SENSITIVITY: 2 mV
MDC IDC STAT BRADY AP VP PERCENT: 1 %
MDC IDC STAT BRADY RA PERCENT PACED: 55 %
MDC IDC STAT BRADY RV PERCENT PACED: 1 %
Pulse Gen Serial Number: 7916025

## 2018-12-21 ENCOUNTER — Ambulatory Visit (INDEPENDENT_AMBULATORY_CARE_PROVIDER_SITE_OTHER): Payer: BLUE CROSS/BLUE SHIELD

## 2018-12-21 DIAGNOSIS — I480 Paroxysmal atrial fibrillation: Secondary | ICD-10-CM

## 2018-12-21 DIAGNOSIS — R55 Syncope and collapse: Secondary | ICD-10-CM

## 2018-12-22 LAB — CUP PACEART REMOTE DEVICE CHECK
Battery Remaining Percentage: 95.5 %
Brady Statistic AP VP Percent: 1 %
Brady Statistic AP VS Percent: 56 %
Date Time Interrogation Session: 20200108070035
Implantable Lead Implant Date: 20080909
Implantable Lead Location: 753859
Implantable Pulse Generator Implant Date: 20170629
Lead Channel Pacing Threshold Amplitude: 1 V
Lead Channel Sensing Intrinsic Amplitude: 12 mV
Lead Channel Sensing Intrinsic Amplitude: 2.1 mV
Lead Channel Setting Pacing Amplitude: 2 V
Lead Channel Setting Pacing Pulse Width: 0.4 ms
Lead Channel Setting Sensing Sensitivity: 2 mV
MDC IDC LEAD IMPLANT DT: 20170629
MDC IDC LEAD LOCATION: 753860
MDC IDC MSMT BATTERY REMAINING LONGEVITY: 122 mo
MDC IDC MSMT BATTERY VOLTAGE: 3.01 V
MDC IDC MSMT LEADCHNL RA IMPEDANCE VALUE: 510 Ohm
MDC IDC MSMT LEADCHNL RA PACING THRESHOLD AMPLITUDE: 0.5 V
MDC IDC MSMT LEADCHNL RA PACING THRESHOLD PULSEWIDTH: 0.4 ms
MDC IDC MSMT LEADCHNL RV IMPEDANCE VALUE: 380 Ohm
MDC IDC MSMT LEADCHNL RV PACING THRESHOLD PULSEWIDTH: 0.4 ms
MDC IDC SET LEADCHNL RV PACING AMPLITUDE: 2.5 V
MDC IDC STAT BRADY AS VP PERCENT: 1 %
MDC IDC STAT BRADY AS VS PERCENT: 44 %
MDC IDC STAT BRADY RA PERCENT PACED: 56 %
MDC IDC STAT BRADY RV PERCENT PACED: 1 %
Pulse Gen Model: 2272
Pulse Gen Serial Number: 7916025

## 2018-12-22 NOTE — Progress Notes (Signed)
Remote pacemaker transmission.   

## 2019-03-22 ENCOUNTER — Other Ambulatory Visit: Payer: Self-pay

## 2019-03-22 ENCOUNTER — Ambulatory Visit (INDEPENDENT_AMBULATORY_CARE_PROVIDER_SITE_OTHER): Payer: BLUE CROSS/BLUE SHIELD | Admitting: *Deleted

## 2019-03-22 DIAGNOSIS — R55 Syncope and collapse: Secondary | ICD-10-CM

## 2019-03-22 DIAGNOSIS — I480 Paroxysmal atrial fibrillation: Secondary | ICD-10-CM

## 2019-03-22 LAB — CUP PACEART REMOTE DEVICE CHECK
Battery Remaining Longevity: 121 mo
Battery Remaining Percentage: 95.5 %
Battery Voltage: 3.01 V
Brady Statistic AP VP Percent: 1 %
Brady Statistic AP VS Percent: 56 %
Brady Statistic AS VP Percent: 1 %
Brady Statistic AS VS Percent: 44 %
Brady Statistic RA Percent Paced: 55 %
Brady Statistic RV Percent Paced: 1 %
Date Time Interrogation Session: 20200408060025
Implantable Lead Implant Date: 20080909
Implantable Lead Implant Date: 20170629
Implantable Lead Location: 753859
Implantable Lead Location: 753860
Implantable Pulse Generator Implant Date: 20170629
Lead Channel Impedance Value: 380 Ohm
Lead Channel Impedance Value: 490 Ohm
Lead Channel Pacing Threshold Amplitude: 0.5 V
Lead Channel Pacing Threshold Amplitude: 1 V
Lead Channel Pacing Threshold Pulse Width: 0.4 ms
Lead Channel Pacing Threshold Pulse Width: 0.4 ms
Lead Channel Sensing Intrinsic Amplitude: 1.3 mV
Lead Channel Sensing Intrinsic Amplitude: 12 mV
Lead Channel Setting Pacing Amplitude: 2 V
Lead Channel Setting Pacing Amplitude: 2.5 V
Lead Channel Setting Pacing Pulse Width: 0.4 ms
Lead Channel Setting Sensing Sensitivity: 2 mV
Pulse Gen Model: 2272
Pulse Gen Serial Number: 7916025

## 2019-03-31 NOTE — Progress Notes (Signed)
Remote pacemaker transmission.   

## 2019-05-01 ENCOUNTER — Other Ambulatory Visit (HOSPITAL_COMMUNITY): Payer: Self-pay | Admitting: Nurse Practitioner

## 2019-06-06 ENCOUNTER — Telehealth: Payer: Self-pay

## 2019-06-06 NOTE — Telephone Encounter (Signed)
I called and left patient a message about changing office visit on 06/15/19 to a telehealth visit.

## 2019-06-13 NOTE — Telephone Encounter (Signed)
I called and spoke with patients wife Lynden AngVicky, ok per patient DPR. She states patient would be ok with changing 06/15/19 office visit to telehealth visit.     Virtual Visit Pre-Appointment Phone Call  "(Name), I am calling you today to discuss your upcoming appointment. We are currently trying to limit exposure to the virus that causes COVID-19 by seeing patients at home rather than in the office."  1. "What is the BEST phone number to call the day of the visit?" - include this in appointment notes  2. "Do you have or have access to (through a family member/friend) a smartphone with video capability that we can use for your visit?" a. If yes - list this number in appt notes as "cell" (if different from BEST phone #) and list the appointment type as a VIDEO visit in appointment notes b. If no - list the appointment type as a PHONE visit in appointment notes  3. Confirm consent - "In the setting of the current Covid19 crisis, you are scheduled for a (phone or video) visit with your provider on (date) at (time).  Just as we do with many in-office visits, in order for you to participate in this visit, we must obtain consent.  If you'd like, I can send this to your mychart (if signed up) or email for you to review.  Otherwise, I can obtain your verbal consent now.  All virtual visits are billed to your insurance company just like a normal visit would be.  By agreeing to a virtual visit, we'd like you to understand that the technology does not allow for your provider to perform an examination, and thus may limit your provider's ability to fully assess your condition. If your provider identifies any concerns that need to be evaluated in person, we will make arrangements to do so.  Finally, though the technology is pretty good, we cannot assure that it will always work on either your or our end, and in the setting of a video visit, we may have to convert it to a phone-only visit.  In either situation, we cannot  ensure that we have a secure connection.  Are you willing to proceed?" STAFF: Did the patient verbally acknowledge consent to telehealth visit? Document YES/NO here: YES  4. Advise patient to be prepared - "Two hours prior to your appointment, go ahead and check your blood pressure, pulse, oxygen saturation, and your weight (if you have the equipment to check those) and write them all down. When your visit starts, your provider will ask you for this information. If you have an Apple Watch or Kardia device, please plan to have heart rate information ready on the day of your appointment. Please have a pen and paper handy nearby the day of the visit as well."  5. Give patient instructions for MyChart download to smartphone OR Doximity/Doxy.me as below if video visit (depending on what platform provider is using)  6. Inform patient they will receive a phone call 15 minutes prior to their appointment time (may be from unknown caller ID) so they should be prepared to answer    TELEPHONE CALL NOTE  Cathleen CortiJames D Yusko has been deemed a candidate for a follow-up tele-health visit to limit community exposure during the Covid-19 pandemic. I spoke with the patient via phone to ensure availability of phone/video source, confirm preferred email & phone number, and discuss instructions and expectations.  I reminded Cathleen CortiJames D Hairston to be prepared with any vital sign and/or heart  rhythm information that could potentially be obtained via home monitoring, at the time of his visit. I reminded MARQUETT BERTOLI to expect a phone call prior to his visit.  Mady Haagensen, [name] 06/13/2019 12:02 PM   INSTRUCTIONS FOR DOWNLOADING THE MYCHART APP TO SMARTPHONE  - The patient must first make sure to have activated MyChart and know their login information - If Apple, go to [employer] and type in MyChart in the search bar and download the app. If Android, ask patient to go to [employer] and type in Advance in the search bar and  download the app. The app is free but as with any other app downloads, their phone may require them to verify saved payment information or Apple/Android password.  - The patient will need to then log into the app with their MyChart username and password, and select [facility] as their healthcare provider to link the account. When it is time for your visit, go to the MyChart app, find appointments, and click Begin Video Visit. Be sure to Select Allow for your device to access the Microphone and Camera for your visit. You will then be connected, and your provider will be with you shortly.  **If they have any issues connecting, or need assistance please contact MyChart service desk [phone])**  **If using a computer, in order to ensure the best quality for their visit they will need to use either of the following Internet Browsers: [employer], or Google Chrome**  IF USING DOXIMITY or DOXY.ME - The patient will receive a link just prior to their visit by text.     FULL LENGTH CONSENT FOR TELE-HEALTH VISIT   I hereby voluntarily request, consent and authorize [facility] and its employed or contracted physicians, physician assistants, nurse practitioners or other licensed health care professionals (the Practitioner), to provide me with telemedicine health care services (the "Services") as deemed necessary by the treating Practitioner. I acknowledge and consent to receive the Services by the Practitioner via telemedicine. I understand that the telemedicine visit will involve communicating with the Practitioner through live audiovisual communication technology and the disclosure of certain medical information by electronic transmission. I acknowledge that I have been given the opportunity to request an in-person assessment or other available alternative prior to the telemedicine visit and am voluntarily participating in the telemedicine visit.  I understand that I have the right to  withhold or withdraw my consent to the use of telemedicine in the course of my care at any time, without affecting my right to future care or treatment, and that the Practitioner or I may terminate the telemedicine visit at any time. I understand that I have the right to inspect all information obtained and/or recorded in the course of the telemedicine visit and may receive copies of available information for a reasonable fee.  I understand that some of the potential risks of receiving the Services via telemedicine include:  Marland Kitchen Delay or interruption in medical evaluation due to technological equipment failure or disruption; . Information transmitted may not be sufficient (e.g. poor resolution of images) to allow for appropriate medical decision making by the Practitioner; and/or  . In rare instances, security protocols could fail, causing a breach of personal health information.  Furthermore, I acknowledge that it is my responsibility to provide information about my medical history, conditions and care that is complete and accurate to the best of my ability. I acknowledge that Practitioner's advice, recommendations, and/or decision may be based  on factors not within their control, such as incomplete or inaccurate data provided by me or distortions of diagnostic images or specimens that may result from electronic transmissions. I understand that the practice of medicine is not an exact science and that Practitioner makes no warranties or guarantees regarding treatment outcomes. I acknowledge that I will receive a copy of this consent concurrently upon execution via email to the email address I last provided but may also request a printed copy by calling the office of [facility].    I understand that my insurance will be billed for this visit.   I have read or had this consent read to me. . I understand the contents of this consent, which adequately explains the benefits and risks of the Services being  provided via telemedicine.  . I have been provided ample opportunity to ask questions regarding this consent and the Services and have had my questions answered to my satisfaction. . I give my informed consent for the services to be provided through the use of telemedicine in my medical care  By participating in this telemedicine visit I agree to the above.

## 2019-06-15 ENCOUNTER — Telehealth (INDEPENDENT_AMBULATORY_CARE_PROVIDER_SITE_OTHER): Payer: BC Managed Care – PPO | Admitting: Internal Medicine

## 2019-06-15 ENCOUNTER — Encounter: Payer: Self-pay | Admitting: Internal Medicine

## 2019-06-15 ENCOUNTER — Other Ambulatory Visit: Payer: Self-pay

## 2019-06-15 VITALS — Ht 71.0 in | Wt 219.0 lb

## 2019-06-15 DIAGNOSIS — R55 Syncope and collapse: Secondary | ICD-10-CM

## 2019-06-15 DIAGNOSIS — I480 Paroxysmal atrial fibrillation: Secondary | ICD-10-CM | POA: Diagnosis not present

## 2019-06-15 DIAGNOSIS — Z950 Presence of cardiac pacemaker: Secondary | ICD-10-CM | POA: Diagnosis not present

## 2019-06-15 DIAGNOSIS — I495 Sick sinus syndrome: Secondary | ICD-10-CM | POA: Diagnosis not present

## 2019-06-15 NOTE — Patient Instructions (Signed)
Called and discussed the following with the pt. He agrees and verbalized understanding.  Medication Instructions:  Your physician recommends that you continue on your current medications as directed. Please refer to the Current Medication list given to you today.   Labwork: Your physician recommends that you return for lab work next week: CBC and BMP  Testing/Procedures: None ordered.   Follow-Up: Your physician recommends that you schedule a follow-up appointment in: 12 months with Dr Caryl Comes  Any Other Special Instructions Will Be Listed Below (If Applicable).     If you need a refill on your cardiac medications before your next appointment, please call your pharmacy.

## 2019-06-15 NOTE — Progress Notes (Signed)
Electrophysiology TeleHealth Note   Due to national recommendations of social distancing due to COVID 19, an audio/video telehealth visit is felt to be most appropriate for this patient at this time.  See MyChart message from today for the patient's consent to telehealth for [facility].   Date:  06/15/2019   ID:  Derrick CortiJames D Solomon, DOB 12/14/1966, MRN [id number]  Location: patient's home  Provider location: [address], [place]  Evaluation Performed: Follow-up visit  PCP:  Corwin LevinsJohn, [name], MD  Cardiologist:   [facility] Electrophysiologist:  SK   Chief Complaint:  Sinus node dysfunction and atrial fib  History of Present Illness:    [name] is a 53 y.o. male who presents via audio/video conferencing for a telehealth visit today.  Since last being seen in our clinic for sinus node dysfunction with remote pacemaker implantation with generator replacement 5/17 with new atrial lead and CAD with prior CABG, the patient reports doing well The patient denies chest pain, shortness of breath, nocturnal dyspnea, orthopnea or peripheral edema.  There have been no palpitations, lightheadedness or syncope.    Date Cr LDL HDL K Hgb  7/19 0.85 65 36      4.4 15.7           Tolerating meds  The patient denies symptoms of fevers, chills, cough, or new SOB worrisome for COVID 19.    Past Medical History:  Diagnosis Date  . Atrial fibrillation (HCC)   . Atrial lead impedance-intermittently low 05/30/2012  . CAD, NATIVE VESSEL 09/11/2009  . DISC DISEASE, LUMBAR   . GERD (gastroesophageal reflux disease)   . Glucose intolerance (impaired glucose tolerance)   . H/O: Bell's palsy   . Hiatal hernia   . Hyperlipidemia   . HYPERSOMNIA, ASSOCIATED WITH SLEEP APNEA   . Hypertension   . LIBIDO, DECREASED   . Lumbar disc disease   . Myocardial infarction (HCC) 2010  . Pacemaker-st judes 2012  . PULMONARY NODULE 05/03/2008  . Sinoatrial node dysfunction (HCC)   . Syncope     Past  Surgical History:  Procedure Laterality Date  . BACK SURGERY    . CARDIAC PACEMAKER PLACEMENT  [address]   [employer] [place] [phone]  . CORONARY ANGIOPLASTY WITH STENT PLACEMENT  2010  . EP IMPLANTABLE DEVICE N/A 06/11/2016   Procedure: INSERTION of new right atrial lead and generator device ;  Surgeon: [name], MD;  [facility];  Service: Cardiovascular;  Laterality: N/A;  . LUMBAR LAMINECTOMY  1990s  . PACEMAKER LEAD REMOVAL  06/11/2016   INSERTION of new right atrial lead and generator device (  . PACEMAKER LEAD REMOVAL N/A 06/11/2016   Procedure: PACEMAKER LEAD REMOVAL ;  Surgeon: [name], MD;  [facility];  Service: Cardiovascular;  Laterality: N/A;  . TEE WITHOUT CARDIOVERSION N/A 06/11/2016   Procedure: TRANSESOPHAGEAL ECHOCARDIOGRAM (TEE);  Surgeon: [name], MD;  [facility];  Service: Cardiovascular;  Laterality: N/A;    Current Outpatient Medications  Medication Sig Dispense Refill  . Glucosamine-Chondroit-Vit C-Mn (GLUCOSAMINE 1500 COMPLEX PO) Take 1 tablet by mouth daily.     Marland Kitchen. losartan (COZAAR) 25 MG tablet Take 1 tablet (25 mg total) by mouth daily. 90 tablet 3  . metoprolol tartrate (LOPRESSOR) 25 MG tablet Take 1 tablet (25 mg total) by mouth 2 (two) times daily. 180 tablet 3  . nitroGLYCERIN (NITROSTAT) 0.4 MG SL tablet Place 0.4 mg under the tongue every  5 (five) minutes as needed for chest pain (MAX 3 TABLETS). Reported on 07/02/2016    . rosuvastatin (CRESTOR) 40 MG tablet Take 1 tablet (40 mg total) by mouth daily. 90 tablet 3  . XARELTO 20 MG TABS tablet TAKE 1 TABLET BY MOUTH EVERY DAY WITH FOOD 30 tablet 6   No current facility-administered medications for this visit.     Allergies:   Lisinopril   Social History:  The patient  reports that he quit smoking about 11 years ago. His smoking use included cigarettes. He has a 25.00 pack-year smoking history. He has quit using smokeless tobacco. He reports current alcohol use of about 2.0 standard  drinks of alcohol per week. He reports that he does not use drugs.   Family History:  The patient's   family history includes Coronary artery disease in an other family member; Diabetes in his maternal grandfather, maternal grandmother, and another family member; Heart attack in his father and paternal grandfather; Hyperlipidemia in an other family member; Hypertension in an other family member.   ROS:  Please see the history of present illness.   All other systems are personally reviewed and negative.    Exam:    Vital Signs:  Ht 5\' 11"  (1.803 m)   Wt 219 lb (99.3 kg)   BMI 30.54 kg/m     Well appearing, alert and conversant, regular work of breathing,  good skin color Eyes- anicteric, neuro- grossly intact, skin- no apparent rash or lesions or cyanosis, mouth- oral mucosa is pink   Labs/Other Tests and Data Reviewed:    Recent Labs: 06/17/2018: ALT 22; BUN 18; Creatinine, Ser 0.85; Potassium 4.4; Sodium 141 06/20/2018: TSH 1.74 09/05/2018: Hemoglobin 15.6; Platelets 205   Wt Readings from Last 3 Encounters:  06/15/19 219 lb (99.3 kg)  09/05/18 218 lb (98.9 kg)  08/04/18 218 lb 6.4 oz (99.1 kg)     Other studies personally reviewed: Additional studies/ records that were reviewed today include:As above    Last device remote is reviewed from [facility] PDF dated 4/20  which reveals normal device function,   arrhythmias - none  False positive atrial fib related to RNRAVS   ASSESSMENT & PLAN:    Sinus node dysfunction  Atrial fibrillation-paroxysmal  Coronary artery disease with prior DES stenting  Pacemaker-St. Jude       Atrial impedance-low  replacement   COVID 19 screen The patient denies symptoms of COVID 19 at this time.  The importance of social distancing was discussed today.  Follow-up:  50m Next remote: As Scheduled   Current medicines are reviewed at length with the patient today.   The patient does not have concerns regarding his medicines.  The following  changes were made today:  none  Labs/ tests ordered today include: BMET and CBC No orders of the defined types were placed in this encounter.   Future tests ( post COVID )    Patient Risk:  after full review of this patients clinical status, I feel that they are at moderate risk at this time.  Today, I have spent 7 minutes with the patient with telehealth technology discussing the above.  Signed, Virl Axe, MD  06/15/2019 4:27 PM     [place] [address] [place] [address] [phone] (office) [phone] (fax)

## 2019-06-19 ENCOUNTER — Other Ambulatory Visit: Payer: BC Managed Care – PPO | Admitting: *Deleted

## 2019-06-19 ENCOUNTER — Other Ambulatory Visit: Payer: Self-pay

## 2019-06-19 DIAGNOSIS — I480 Paroxysmal atrial fibrillation: Secondary | ICD-10-CM

## 2019-06-19 DIAGNOSIS — Z950 Presence of cardiac pacemaker: Secondary | ICD-10-CM

## 2019-06-19 DIAGNOSIS — R55 Syncope and collapse: Secondary | ICD-10-CM

## 2019-06-19 DIAGNOSIS — I495 Sick sinus syndrome: Secondary | ICD-10-CM | POA: Diagnosis not present

## 2019-06-20 LAB — CBC
Hematocrit: 44.6 % (ref 37.5–51.0)
Hemoglobin: 15.9 g/dL (ref 13.0–17.7)
MCH: 32.5 pg (ref 26.6–33.0)
MCHC: 35.7 g/dL (ref 31.5–35.7)
MCV: 91 fL (ref 79–97)
Platelets: 220 10*3/uL (ref 150–450)
RBC: 4.89 x10E6/uL (ref 4.14–5.80)
RDW: 12.1 % (ref 11.6–15.4)
WBC: 5.3 10*3/uL (ref 3.4–10.8)

## 2019-06-20 LAB — BASIC METABOLIC PANEL
BUN/Creatinine Ratio: 15 (ref 9–20)
BUN: 16 mg/dL (ref 6–24)
CO2: 23 mmol/L (ref 20–29)
Calcium: 9.8 mg/dL (ref 8.7–10.2)
Chloride: 107 mmol/L — ABNORMAL HIGH (ref 96–106)
Creatinine, Ser: 1.09 mg/dL (ref 0.76–1.27)
GFR calc Af Amer: 88 mL/min/{1.73_m2} (ref >59)
GFR calc non Af Amer: 77 mL/min/{1.73_m2} (ref >59)
Glucose: 79 mg/dL (ref 65–99)
Potassium: 4.1 mmol/L (ref 3.5–5.2)
Sodium: 145 mmol/L — ABNORMAL HIGH (ref 134–144)

## 2019-06-21 ENCOUNTER — Ambulatory Visit (INDEPENDENT_AMBULATORY_CARE_PROVIDER_SITE_OTHER): Payer: BC Managed Care – PPO | Admitting: *Deleted

## 2019-06-21 DIAGNOSIS — I495 Sick sinus syndrome: Secondary | ICD-10-CM | POA: Diagnosis not present

## 2019-06-22 LAB — CUP PACEART REMOTE DEVICE CHECK
Battery Remaining Longevity: 122 mo
Battery Remaining Percentage: 95.5 %
Battery Voltage: 3.01 V
Brady Statistic AP VP Percent: 1 %
Brady Statistic AP VS Percent: 57 %
Brady Statistic AS VP Percent: 1 %
Brady Statistic AS VS Percent: 43 %
Brady Statistic RA Percent Paced: 56 %
Brady Statistic RV Percent Paced: 1 %
Date Time Interrogation Session: 20200709051115
Implantable Lead Implant Date: 20080909
Implantable Lead Implant Date: 20170629
Implantable Lead Location: 753859
Implantable Lead Location: 753860
Implantable Pulse Generator Implant Date: 20170629
Lead Channel Impedance Value: 380 Ohm
Lead Channel Impedance Value: 510 Ohm
Lead Channel Sensing Intrinsic Amplitude: 1.3 mV
Lead Channel Sensing Intrinsic Amplitude: 12 mV
Lead Channel Setting Pacing Amplitude: 2 V
Lead Channel Setting Pacing Amplitude: 2.5 V
Lead Channel Setting Pacing Pulse Width: 0.4 ms
Lead Channel Setting Sensing Sensitivity: 2 mV
Pulse Gen Model: 2272
Pulse Gen Serial Number: 7916025

## 2019-07-03 ENCOUNTER — Encounter: Payer: Self-pay | Admitting: Cardiology

## 2019-07-03 NOTE — Progress Notes (Signed)
Remote pacemaker transmission.   

## 2019-08-23 ENCOUNTER — Encounter: Payer: Self-pay | Admitting: Emergency Medicine

## 2019-08-23 NOTE — Telephone Encounter (Signed)
Opened in error

## 2019-08-28 ENCOUNTER — Other Ambulatory Visit: Payer: Self-pay | Admitting: Internal Medicine

## 2019-08-29 ENCOUNTER — Other Ambulatory Visit: Payer: Self-pay | Admitting: Internal Medicine

## 2019-08-29 MED ORDER — LOSARTAN POTASSIUM 25 MG PO TABS: 25.0000 mg | ORAL_TABLET | Freq: Every day | ORAL | 3 refills | Status: DC

## 2019-08-29 NOTE — Telephone Encounter (Signed)
Pt's wife calling requesting a refill on Losartan. Pt's PCP has been refilling this medication. Would Dr. Caryl Comes like to refill this medication? Please address

## 2019-08-29 NOTE — Telephone Encounter (Signed)
Medication Refill - Medication: losartan (COZAAR) 25 MG tablet   Has the patient contacted their pharmacy? Yes.   (Agent: If no, request that the patient contact the pharmacy for the refill.) (Agent: If yes, when and what did the pharmacy advise?)  Preferred Pharmacy (with phone number or street name):  [facility], [place]  [place] [address]  Phone: [phone] Fax: [phone]     Agent: Please be advised that RX refills may take up to 3 business days. We ask that you follow-up with your pharmacy.

## 2019-09-14 ENCOUNTER — Other Ambulatory Visit: Payer: Self-pay

## 2019-09-14 ENCOUNTER — Ambulatory Visit (INDEPENDENT_AMBULATORY_CARE_PROVIDER_SITE_OTHER): Payer: BC Managed Care – PPO | Admitting: Internal Medicine

## 2019-09-14 ENCOUNTER — Other Ambulatory Visit: Payer: Self-pay | Admitting: Internal Medicine

## 2019-09-14 ENCOUNTER — Encounter: Payer: Self-pay | Admitting: Internal Medicine

## 2019-09-14 ENCOUNTER — Other Ambulatory Visit (INDEPENDENT_AMBULATORY_CARE_PROVIDER_SITE_OTHER): Payer: BC Managed Care – PPO

## 2019-09-14 VITALS — BP 124/84 | HR 72 | Temp 98.0°F | Ht 71.0 in | Wt 220.0 lb

## 2019-09-14 DIAGNOSIS — R739 Hyperglycemia, unspecified: Secondary | ICD-10-CM

## 2019-09-14 DIAGNOSIS — E611 Iron deficiency: Secondary | ICD-10-CM | POA: Diagnosis not present

## 2019-09-14 DIAGNOSIS — E559 Vitamin D deficiency, unspecified: Secondary | ICD-10-CM | POA: Diagnosis not present

## 2019-09-14 DIAGNOSIS — E538 Deficiency of other specified B group vitamins: Secondary | ICD-10-CM

## 2019-09-14 DIAGNOSIS — Z125 Encounter for screening for malignant neoplasm of prostate: Secondary | ICD-10-CM

## 2019-09-14 DIAGNOSIS — Z0000 Encounter for general adult medical examination without abnormal findings: Secondary | ICD-10-CM | POA: Diagnosis not present

## 2019-09-14 LAB — CBC WITH DIFFERENTIAL/PLATELET
Basophils Absolute: 0.1 10*3/uL (ref 0.0–0.1)
Basophils Relative: 1.6 % (ref 0.0–3.0)
Eosinophils Absolute: 0.2 10*3/uL (ref 0.0–0.7)
Eosinophils Relative: 3.9 % (ref 0.0–5.0)
HCT: 45.8 % (ref 39.0–52.0)
Hemoglobin: 16.2 g/dL (ref 13.0–17.0)
Lymphocytes Relative: 34.6 % (ref 12.0–46.0)
Lymphs Abs: 1.9 10*3/uL (ref 0.7–4.0)
MCHC: 35.4 g/dL (ref 30.0–36.0)
MCV: 93.1 fl (ref 78.0–100.0)
Monocytes Absolute: 0.6 10*3/uL (ref 0.1–1.0)
Monocytes Relative: 11 % (ref 3.0–12.0)
Neutro Abs: 2.7 10*3/uL (ref 1.4–7.7)
Neutrophils Relative %: 48.9 % (ref 43.0–77.0)
Platelets: 215 10*3/uL (ref 150.0–400.0)
RBC: 4.92 Mil/uL (ref 4.22–5.81)
RDW: 12.5 % (ref 11.5–15.5)
WBC: 5.5 10*3/uL (ref 4.0–10.5)

## 2019-09-14 LAB — HEMOGLOBIN A1C: Hgb A1c MFr Bld: 5.3 % (ref 4.6–6.5)

## 2019-09-14 LAB — BASIC METABOLIC PANEL
BUN: 17 mg/dL (ref 6–23)
CO2: 29 mEq/L (ref 19–32)
Calcium: 9.6 mg/dL (ref 8.4–10.5)
Chloride: 106 mEq/L (ref 96–112)
Creatinine, Ser: 0.97 mg/dL (ref 0.40–1.50)
GFR: 80.29 mL/min (ref >60.00)
Glucose, Bld: 77 mg/dL (ref 70–99)
Potassium: 4.3 mEq/L (ref 3.5–5.1)
Sodium: 143 mEq/L (ref 135–145)

## 2019-09-14 LAB — URINALYSIS, ROUTINE W REFLEX MICROSCOPIC
Bilirubin Urine: NEGATIVE
Ketones, ur: NEGATIVE
Leukocytes,Ua: NEGATIVE
Nitrite: NEGATIVE
RBC / HPF: NONE SEEN (ref 0–?)
Specific Gravity, Urine: >=1.03 — AB (ref 1.000–1.030)
Total Protein, Urine: NEGATIVE
Urine Glucose: NEGATIVE
Urobilinogen, UA: 0.2 (ref 0.0–1.0)
WBC, UA: NONE SEEN (ref 0–?)
pH: 6 (ref 5.0–8.0)

## 2019-09-14 LAB — HEPATIC FUNCTION PANEL
ALT: 19 U/L (ref 0–53)
AST: 18 U/L (ref 0–37)
Albumin: 4.5 g/dL (ref 3.5–5.2)
Alkaline Phosphatase: 41 U/L (ref 39–117)
Bilirubin, Direct: 0.2 mg/dL (ref 0.0–0.3)
Total Bilirubin: 0.9 mg/dL (ref 0.2–1.2)
Total Protein: 6.8 g/dL (ref 6.0–8.3)

## 2019-09-14 LAB — LIPID PANEL
Cholesterol: 126 mg/dL (ref 0–200)
HDL: 38.8 mg/dL — ABNORMAL LOW (ref >39.00)
LDL Cholesterol: 60 mg/dL (ref 0–99)
NonHDL: 87.05
Total CHOL/HDL Ratio: 3
Triglycerides: 133 mg/dL (ref 0.0–149.0)
VLDL: 26.6 mg/dL (ref 0.0–40.0)

## 2019-09-14 LAB — TSH: TSH: 1.59 u[IU]/mL (ref 0.35–4.50)

## 2019-09-14 LAB — IBC PANEL
Iron: 147 ug/dL (ref 42–165)
Saturation Ratios: 49.1 % (ref 20.0–50.0)
Transferrin: 214 mg/dL (ref 212.0–360.0)

## 2019-09-14 LAB — VITAMIN D 25 HYDROXY (VIT D DEFICIENCY, FRACTURES): VITD: 26.03 ng/mL — ABNORMAL LOW (ref 30.00–100.00)

## 2019-09-14 LAB — PSA: PSA: 0.6 ng/mL (ref 0.10–4.00)

## 2019-09-14 LAB — VITAMIN B12: Vitamin B-12: 251 pg/mL (ref 211–911)

## 2019-09-14 MED ORDER — VITAMIN D (ERGOCALCIFEROL) 1.25 MG (50000 UNIT) PO CAPS: 50000.0000 [IU] | ORAL_CAPSULE | ORAL | 0 refills | Status: DC

## 2019-09-14 NOTE — Assessment & Plan Note (Signed)

## 2019-09-14 NOTE — Patient Instructions (Signed)

## 2019-09-14 NOTE — Assessment & Plan Note (Signed)
stable overall by history and exam, recent data reviewed with pt, and pt to continue medical treatment as before,  to f/u any worsening symptoms or concerns  

## 2019-09-14 NOTE — Progress Notes (Signed)
Subjective:    Patient ID: [name], male    DOB: 11/22/1966, 53 y.o.   MRN: [id number]  HPI  Here for wellness and f/u;  Overall doing ok;  Pt denies Chest pain, worsening SOB, DOE, wheezing, orthopnea, PND, worsening LE edema, palpitations, dizziness or syncope.  Pt denies neurological change such as new headache, facial or extremity weakness.  Pt denies polydipsia, polyuria, or low sugar symptoms. Pt states overall good compliance with treatment and medications, good tolerability, and has been trying to follow appropriate diet.  Pt denies worsening depressive symptoms, suicidal ideation or panic. No fever, night sweats, wt loss, loss of appetite, or other constitutional symptoms.  Pt states good ability with ADL's, has low fall risk, home safety reviewed and adequate, no other significant changes in hearing or vision, and only occasionally active with exercise.  No new complaints Past Medical History:  Diagnosis Date  . Atrial fibrillation ([place])   . Atrial lead impedance-intermittently low 05/30/2012  . CAD, NATIVE VESSEL 09/11/2009  . DISC DISEASE, LUMBAR   . GERD (gastroesophageal reflux disease)   . Glucose intolerance (impaired glucose tolerance)   . H/O: Bell's palsy   . Hiatal hernia   . Hyperlipidemia   . HYPERSOMNIA, ASSOCIATED WITH SLEEP APNEA   . Hypertension   . LIBIDO, DECREASED   . Lumbar disc disease   . Myocardial infarction ([place]) 2010  . Pacemaker-st judes 2012  . PULMONARY NODULE 05/03/2008  . Sinoatrial node dysfunction (HCC)   . Syncope    Past Surgical History:  Procedure Laterality Date  . BACK SURGERY    . CARDIAC PACEMAKER PLACEMENT  [address] [place] [phone]  . CORONARY ANGIOPLASTY WITH STENT PLACEMENT  2010  . EP IMPLANTABLE DEVICE N/A 06/11/2016   Procedure: INSERTION of new right atrial lead and generator device ;  Surgeon: Evans Lance, MD;  Location: [place];  Service: Cardiovascular;  Laterality: N/A;  . LUMBAR LAMINECTOMY  1990s  . PACEMAKER  LEAD REMOVAL  06/11/2016   INSERTION of new right atrial lead and generator device (  . PACEMAKER LEAD REMOVAL N/A 06/11/2016   Procedure: PACEMAKER LEAD REMOVAL ;  Surgeon: Evans Lance, MD;  Location: [place];  Service: Cardiovascular;  Laterality: N/A;  . TEE WITHOUT CARDIOVERSION N/A 06/11/2016   Procedure: TRANSESOPHAGEAL ECHOCARDIOGRAM (TEE);  Surgeon: Evans Lance, MD;  Location: [place];  Service: Cardiovascular;  Laterality: N/A;    reports that he quit smoking about 12 years ago. His smoking use included cigarettes. He has a 25.00 pack-year smoking history. He has quit using smokeless tobacco. He reports current alcohol use of about 2.0 standard drinks of alcohol per week. He reports that he does not use drugs. family history includes Coronary artery disease in an other family member; Diabetes in his maternal grandfather, maternal grandmother, and another family member; Heart attack in his father and paternal grandfather; Hyperlipidemia in an other family member; Hypertension in an other family member. Allergies  Allergen Reactions  . Lisinopril     REACTION: cough   Current Outpatient Medications on File Prior to Visit  Medication Sig Dispense Refill  . Glucosamine-Chondroit-Vit C-Mn (GLUCOSAMINE 1500 COMPLEX PO) Take 1 tablet by mouth daily.     Marland Kitchen losartan (COZAAR) 25 MG tablet Take 1 tablet (25 mg total) by mouth daily. 90 tablet 3  . metoprolol tartrate (LOPRESSOR) 25 MG tablet Take 1 tablet (25 mg total) by mouth 2 (two) times daily. 180 tablet 3  .  nitroGLYCERIN (NITROSTAT) 0.4 MG SL tablet Place 0.4 mg under the tongue every 5 (five) minutes as needed for chest pain (MAX 3 TABLETS). Reported on 07/02/2016    . rosuvastatin (CRESTOR) 40 MG tablet Take 1 tablet (40 mg total) by mouth daily. 90 tablet 3  . XARELTO 20 MG TABS tablet TAKE 1 TABLET BY MOUTH EVERY DAY WITH FOOD 30 tablet 6   No current facility-administered medications on file prior to visit.    Review of Systems  Constitutional: Negative for other unusual diaphoresis, sweats, appetite or weight changes HENT: Negative for other worsening hearing loss, ear pain, facial swelling, mouth sores or neck stiffness.   Eyes: Negative for other worsening pain, redness or other visual disturbance.  Respiratory: Negative for other stridor or swelling Cardiovascular: Negative for other palpitations or other chest pain  Gastrointestinal: Negative for worsening diarrhea or loose stools, blood in stool, distention or other pain Genitourinary: Negative for hematuria, flank pain or other change in urine volume.  Musculoskeletal: Negative for myalgias or other joint swelling.  Skin: Negative for other color change, or other wound or worsening drainage.  Neurological: Negative for other syncope or numbness. Hematological: Negative for other adenopathy or swelling Psychiatric/Behavioral: Negative for hallucinations, other worsening agitation, SI, self-injury, or new decreased concentration All otherwise neg per pt     Objective:   Physical Exam BP 124/84   Pulse 72   Temp 98 F (36.7 C) (Oral)   Ht 5\' 11"  (1.803 m)   Wt 220 lb (99.8 kg)   SpO2 97%   BMI 30.68 kg/m  VS noted,  Constitutional: Pt is oriented to person, place, and time. Appears well-developed and well-nourished, in no significant distress and comfortable Head: Normocephalic and atraumatic  Eyes: Conjunctivae and EOM are normal. Pupils are equal, round, and reactive to light Right Ear: External ear normal without discharge Left Ear: External ear normal without discharge Nose: Nose without discharge or deformity Mouth/Throat: Oropharynx is without other ulcerations and moist  Neck: Normal range of motion. Neck supple. No JVD present. No tracheal deviation present or significant neck LA or mass Cardiovascular: Normal rate, regular rhythm, normal heart sounds and intact distal pulses.   Pulmonary/Chest: WOB normal and breath sounds without rales or  wheezing  Abdominal: Soft. Bowel sounds are normal. NT. No HSM  Musculoskeletal: Normal range of motion. Exhibits no edema Lymphadenopathy: Has no other cervical adenopathy.  Neurological: Pt is alert and oriented to person, place, and time. Pt has normal reflexes. No cranial nerve deficit. Motor grossly intact, Gait intact Skin: Skin is warm and dry. No rash noted or new ulcerations Psychiatric:  Has normal mood and affect. Behavior is normal without agitation All otherwise neg per pt Lab Results  Component Value Date   WBC 5.3 06/19/2019   HGB 15.9 06/19/2019   HCT 44.6 06/19/2019   PLT 220 06/19/2019   GLUCOSE 79 06/19/2019   CHOL 114 06/17/2018   TRIG 63.0 06/17/2018   HDL 36.20 (L) 06/17/2018   LDLCALC 65 06/17/2018   ALT 22 06/17/2018   AST 23 06/17/2018   NA 145 (H) 06/19/2019   K 4.1 06/19/2019   CL 107 (H) 06/19/2019   CREATININE 1.09 06/19/2019   BUN 16 06/19/2019   CO2 23 06/19/2019   TSH 1.74 06/20/2018   PSA 0.82 06/20/2018   INR 1.1 08/25/2009   HGBA1C 5.2 06/20/2018      Assessment & Plan:

## 2019-09-15 ENCOUNTER — Telehealth: Payer: Self-pay

## 2019-09-15 NOTE — Telephone Encounter (Signed)
Pt has viewed results via MyChart  

## 2019-09-15 NOTE — Telephone Encounter (Signed)
-----   Message from Biagio Borg, MD sent at 09/14/2019  7:52 PM EDT ----- Left message on MyChart, pt to cont same tx  The test results show that your current treatment is OK, except the Vitamin D level is low. Please take Vitamin D 50000 units weekly for 12 weeks, then plan to change to OTC Vitamin D3 at 2000 units per day, indefinitely.Redmond Baseman to please inform pt, I will do rx

## 2019-09-20 ENCOUNTER — Ambulatory Visit (INDEPENDENT_AMBULATORY_CARE_PROVIDER_SITE_OTHER): Payer: BC Managed Care – PPO | Admitting: *Deleted

## 2019-09-20 DIAGNOSIS — I471 Supraventricular tachycardia: Secondary | ICD-10-CM

## 2019-09-20 DIAGNOSIS — I480 Paroxysmal atrial fibrillation: Secondary | ICD-10-CM

## 2019-09-21 LAB — CUP PACEART REMOTE DEVICE CHECK
Battery Remaining Longevity: 122 mo
Battery Remaining Percentage: 95.5 %
Battery Voltage: 3.01 V
Brady Statistic AP VP Percent: 1 %
Brady Statistic AP VS Percent: 58 %
Brady Statistic AS VP Percent: 1 %
Brady Statistic AS VS Percent: 42 %
Brady Statistic RA Percent Paced: 57 %
Brady Statistic RV Percent Paced: 1 %
Date Time Interrogation Session: 20201007075547
Implantable Lead Implant Date: 20080909
Implantable Lead Implant Date: 20170629
Implantable Lead Location: 753859
Implantable Lead Location: 753860
Implantable Pulse Generator Implant Date: 20170629
Lead Channel Impedance Value: 390 Ohm
Lead Channel Impedance Value: 490 Ohm
Lead Channel Pacing Threshold Amplitude: 0.5 V
Lead Channel Pacing Threshold Amplitude: 1 V
Lead Channel Pacing Threshold Pulse Width: 0.4 ms
Lead Channel Pacing Threshold Pulse Width: 0.4 ms
Lead Channel Sensing Intrinsic Amplitude: 1.1 mV
Lead Channel Sensing Intrinsic Amplitude: 12 mV
Lead Channel Setting Pacing Amplitude: 2 V
Lead Channel Setting Pacing Amplitude: 2.5 V
Lead Channel Setting Pacing Pulse Width: 0.4 ms
Lead Channel Setting Sensing Sensitivity: 2 mV
Pulse Gen Model: 2272
Pulse Gen Serial Number: 7916025

## 2019-09-29 NOTE — Progress Notes (Signed)
Remote pacemaker transmission.   

## 2019-10-26 ENCOUNTER — Other Ambulatory Visit: Payer: Self-pay | Admitting: Physician Assistant

## 2019-10-26 ENCOUNTER — Encounter: Payer: Self-pay | Admitting: Internal Medicine

## 2019-10-26 MED ORDER — ROSUVASTATIN CALCIUM 40 MG PO TABS: 40.0000 mg | ORAL_TABLET | Freq: Every day | ORAL | 3 refills | Status: DC

## 2019-10-30 ENCOUNTER — Other Ambulatory Visit (HOSPITAL_COMMUNITY): Payer: Self-pay | Admitting: Nurse Practitioner

## 2019-10-30 NOTE — Telephone Encounter (Signed)
Prescription refill request for Xarelto received.   Last office visit: Klein,(06-15-2019) Weight: 99.8 kg (09-14-2019) Age: 53 y.o. Scr: 0.97, (09-14-2019) CrCl: 121 ml/min  Prescription refill sent.

## 2019-12-20 ENCOUNTER — Ambulatory Visit (INDEPENDENT_AMBULATORY_CARE_PROVIDER_SITE_OTHER): Payer: BC Managed Care – PPO | Admitting: *Deleted

## 2019-12-20 DIAGNOSIS — I471 Supraventricular tachycardia: Secondary | ICD-10-CM | POA: Diagnosis not present

## 2019-12-20 LAB — CUP PACEART REMOTE DEVICE CHECK
Battery Remaining Longevity: 122 mo
Battery Remaining Percentage: 95.5 %
Battery Voltage: 3.01 V
Brady Statistic AP VP Percent: 1 %
Brady Statistic AP VS Percent: 58 %
Brady Statistic AS VP Percent: 1 %
Brady Statistic AS VS Percent: 42 %
Brady Statistic RA Percent Paced: 58 %
Brady Statistic RV Percent Paced: 1 %
Date Time Interrogation Session: 20210106020028
Implantable Lead Implant Date: 20080909
Implantable Lead Implant Date: 20170629
Implantable Lead Location: 753859
Implantable Lead Location: 753860
Implantable Pulse Generator Implant Date: 20170629
Lead Channel Impedance Value: 390 Ohm
Lead Channel Impedance Value: 480 Ohm
Lead Channel Pacing Threshold Amplitude: 0.5 V
Lead Channel Pacing Threshold Amplitude: 1 V
Lead Channel Pacing Threshold Pulse Width: 0.4 ms
Lead Channel Pacing Threshold Pulse Width: 0.4 ms
Lead Channel Sensing Intrinsic Amplitude: 1 mV
Lead Channel Sensing Intrinsic Amplitude: 12 mV
Lead Channel Setting Pacing Amplitude: 2 V
Lead Channel Setting Pacing Amplitude: 2.5 V
Lead Channel Setting Pacing Pulse Width: 0.4 ms
Lead Channel Setting Sensing Sensitivity: 2 mV
Pulse Gen Model: 2272
Pulse Gen Serial Number: 7916025

## 2020-03-20 ENCOUNTER — Ambulatory Visit (INDEPENDENT_AMBULATORY_CARE_PROVIDER_SITE_OTHER): Payer: BC Managed Care – PPO | Admitting: *Deleted

## 2020-03-20 DIAGNOSIS — I471 Supraventricular tachycardia: Secondary | ICD-10-CM

## 2020-03-20 LAB — CUP PACEART REMOTE DEVICE CHECK
Battery Remaining Longevity: 120 mo
Battery Remaining Percentage: 95.5 %
Battery Voltage: 3.01 V
Brady Statistic AP VP Percent: 1 %
Brady Statistic AP VS Percent: 59 %
Brady Statistic AS VP Percent: 1 %
Brady Statistic AS VS Percent: 41 %
Brady Statistic RA Percent Paced: 59 %
Brady Statistic RV Percent Paced: 1 %
Date Time Interrogation Session: 20210407032520
Implantable Lead Implant Date: 20080909
Implantable Lead Implant Date: 20170629
Implantable Lead Location: 753859
Implantable Lead Location: 753860
Implantable Pulse Generator Implant Date: 20170629
Lead Channel Impedance Value: 360 Ohm
Lead Channel Impedance Value: 460 Ohm
Lead Channel Pacing Threshold Amplitude: 0.5 V
Lead Channel Pacing Threshold Amplitude: 1 V
Lead Channel Pacing Threshold Pulse Width: 0.4 ms
Lead Channel Pacing Threshold Pulse Width: 0.4 ms
Lead Channel Sensing Intrinsic Amplitude: 1 mV
Lead Channel Sensing Intrinsic Amplitude: 12 mV
Lead Channel Setting Pacing Amplitude: 2 V
Lead Channel Setting Pacing Amplitude: 2.5 V
Lead Channel Setting Pacing Pulse Width: 0.4 ms
Lead Channel Setting Sensing Sensitivity: 2 mV
Pulse Gen Model: 2272
Pulse Gen Serial Number: 7916025

## 2020-03-20 NOTE — Progress Notes (Signed)
ICD Remote  

## 2020-04-06 ENCOUNTER — Other Ambulatory Visit (HOSPITAL_COMMUNITY): Payer: Self-pay | Admitting: Nurse Practitioner

## 2020-04-08 NOTE — Telephone Encounter (Signed)
Last OV 06/15/2019 Scr 0.97 on 09/14/2019 20.12kg  54 years old ABW crcl 121 ml/min Xarelto 20mg  sent to pharamcy

## 2020-04-22 ENCOUNTER — Other Ambulatory Visit: Payer: Self-pay | Admitting: Internal Medicine

## 2020-04-22 NOTE — Telephone Encounter (Signed)
Refill already sent../lmb 

## 2020-04-22 NOTE — Telephone Encounter (Signed)
New message:   1.Medication Requested: metoprolol tartrate (LOPRESSOR) 25 MG tablet 2. Pharmacy (Name, Street, [place]): [facility] [address] [place], [place] - [address] 3. On Med List: Yes  4. Last Visit with PCP: 09/14/19  5. Next visit date with PCP: 09/16/20   Agent: Please be advised that RX refills may take up to 3 business days. We ask that you follow-up with your pharmacy.

## 2020-06-19 ENCOUNTER — Ambulatory Visit (INDEPENDENT_AMBULATORY_CARE_PROVIDER_SITE_OTHER): Payer: BC Managed Care – PPO | Admitting: *Deleted

## 2020-06-19 DIAGNOSIS — I480 Paroxysmal atrial fibrillation: Secondary | ICD-10-CM

## 2020-06-19 LAB — CUP PACEART REMOTE DEVICE CHECK
Battery Remaining Longevity: 121 mo
Battery Remaining Percentage: 95.5 %
Battery Voltage: 3.01 V
Brady Statistic AP VP Percent: 1 %
Brady Statistic AP VS Percent: 59 %
Brady Statistic AS VP Percent: 1 %
Brady Statistic AS VS Percent: 41 %
Brady Statistic RA Percent Paced: 59 %
Brady Statistic RV Percent Paced: 1 %
Date Time Interrogation Session: 20210707021019
Implantable Lead Implant Date: 20080909
Implantable Lead Implant Date: 20170629
Implantable Lead Location: 753859
Implantable Lead Location: 753860
Implantable Pulse Generator Implant Date: 20170629
Lead Channel Impedance Value: 380 Ohm
Lead Channel Impedance Value: 460 Ohm
Lead Channel Pacing Threshold Amplitude: 0.5 V
Lead Channel Pacing Threshold Amplitude: 1 V
Lead Channel Pacing Threshold Pulse Width: 0.4 ms
Lead Channel Pacing Threshold Pulse Width: 0.4 ms
Lead Channel Sensing Intrinsic Amplitude: 1 mV
Lead Channel Sensing Intrinsic Amplitude: 12 mV
Lead Channel Setting Pacing Amplitude: 2 V
Lead Channel Setting Pacing Amplitude: 2.5 V
Lead Channel Setting Pacing Pulse Width: 0.4 ms
Lead Channel Setting Sensing Sensitivity: 2 mV
Pulse Gen Model: 2272
Pulse Gen Serial Number: 7916025

## 2020-06-21 NOTE — Progress Notes (Signed)
Remote pacemaker transmission.   

## 2020-07-04 ENCOUNTER — Telehealth: Payer: Self-pay

## 2020-07-04 ENCOUNTER — Other Ambulatory Visit: Payer: Self-pay

## 2020-07-04 ENCOUNTER — Telehealth (INDEPENDENT_AMBULATORY_CARE_PROVIDER_SITE_OTHER): Payer: BC Managed Care – PPO | Admitting: Internal Medicine

## 2020-07-04 VITALS — Ht 72.0 in | Wt 216.0 lb

## 2020-07-04 DIAGNOSIS — Z950 Presence of cardiac pacemaker: Secondary | ICD-10-CM

## 2020-07-04 DIAGNOSIS — I480 Paroxysmal atrial fibrillation: Secondary | ICD-10-CM | POA: Diagnosis not present

## 2020-07-04 DIAGNOSIS — I495 Sick sinus syndrome: Secondary | ICD-10-CM

## 2020-07-04 NOTE — Patient Instructions (Signed)

## 2020-07-04 NOTE — Telephone Encounter (Signed)
  Patient Consent for Virtual Visit         [name] has provided verbal consent on 07/04/2020 for a virtual visit (video or telephone).   CONSENT FOR VIRTUAL VISIT FOR:  [name]  By participating in this virtual visit I agree to the following:  I hereby voluntarily request, consent and authorize [facility] and its employed or contracted physicians, physician assistants, nurse practitioners or other licensed health care professionals (the Practitioner), to provide me with telemedicine health care services (the "Services") as deemed necessary by the treating Practitioner. I acknowledge and consent to receive the Services by the Practitioner via telemedicine. I understand that the telemedicine visit will involve communicating with the Practitioner through live audiovisual communication technology and the disclosure of certain medical information by electronic transmission. I acknowledge that I have been given the opportunity to request an in-person assessment or other available alternative prior to the telemedicine visit and am voluntarily participating in the telemedicine visit.  I understand that I have the right to withhold or withdraw my consent to the use of telemedicine in the course of my care at any time, without affecting my right to future care or treatment, and that the Practitioner or I may terminate the telemedicine visit at any time. I understand that I have the right to inspect all information obtained and/or recorded in the course of the telemedicine visit and may receive copies of available information for a reasonable fee.  I understand that some of the potential risks of receiving the Services via telemedicine include:  Marland Kitchen Delay or interruption in medical evaluation due to technological equipment failure or disruption; . Information transmitted may not be sufficient (e.g. poor resolution of images) to allow for appropriate medical decision making by the Practitioner;  and/or  . In rare instances, security protocols could fail, causing a breach of personal health information.  Furthermore, I acknowledge that it is my responsibility to provide information about my medical history, conditions and care that is complete and accurate to the best of my ability. I acknowledge that Practitioner's advice, recommendations, and/or decision may be based on factors not within their control, such as incomplete or inaccurate data provided by me or distortions of diagnostic images or specimens that may result from electronic transmissions. I understand that the practice of medicine is not an exact science and that Practitioner makes no warranties or guarantees regarding treatment outcomes. I acknowledge that a copy of this consent can be made available to me via my patient portal [facility] MyChart), or I can request a printed copy by calling the office of [facility].    I understand that my insurance will be billed for this visit.   I have read or had this consent read to me. . I understand the contents of this consent, which adequately explains the benefits and risks of the Services being provided via telemedicine.  . I have been provided ample opportunity to ask questions regarding this consent and the Services and have had my questions answered to my satisfaction. . I give my informed consent for the services to be provided through the use of telemedicine in my medical care

## 2020-07-04 NOTE — Progress Notes (Signed)
Electrophysiology TeleHealth Note   Due to national recommendations of social distancing due to COVID 19, an audio/video telehealth visit is felt to be most appropriate for this patient at this time.  See MyChart message from today for the patient's consent to telehealth for [facility].   Date:  07/04/2020   ID:  [name], DOB 09/21/1966, MRN [id number]  Location: patient's home  Provider location: [address], [place]  Evaluation Performed: Follow-up visit  PCP:  Corwin Levins, MD  Cardiologist:   [facility] Electrophysiologist:  SK   Chief Complaint:  Sinus node dysfunction and atrial fib  History of Present Illness:    [name] is a 54 y.o. male who presents via audio/video conferencing for a telehealth visit today.  Since last being seen in our clinic for sinus node dysfunction with remote pacemaker implantation with generator replacement 5/17 with new atrial lead and CAD with prior CABG, the patient denies chest pain, shortness of breath, nocturnal dyspnea, orthopnea    There have been no palpitations, lightheadedness or syncope.   Mild edema   Works in a Management consultant    Date Cr K LDL HDL Hgb  7/19 0.85  65 36      15.7  10/20  0.97 4.3 60      DATE TEST EF   6/17 TEE 45-50%          Tolerating meds  The patient denies symptoms of fevers, chills, cough, or new SOB worrisome for COVID 19.    Past Medical History:  Diagnosis Date   Atrial fibrillation [facility])    Atrial lead impedance-intermittently low 05/30/2012   CAD, NATIVE VESSEL 09/11/2009   DISC DISEASE, LUMBAR    GERD (gastroesophageal reflux disease)    Glucose intolerance (impaired glucose tolerance)    H/O: Bell's palsy    Hiatal hernia    Hyperlipidemia    HYPERSOMNIA, ASSOCIATED WITH SLEEP APNEA    Hypertension    LIBIDO, DECREASED    Lumbar disc disease    Myocardial infarction (HCC) 2010   Pacemaker-st judes 2012   PULMONARY NODULE  05/03/2008   Sinoatrial node dysfunction (HCC)    Syncope     Past Surgical History:  Procedure Laterality Date   BACK SURGERY     CARDIAC PACEMAKER PLACEMENT  2008   [employer] Jude [place] [id number]   CORONARY ANGIOPLASTY WITH STENT PLACEMENT  2010   EP IMPLANTABLE DEVICE N/A 06/11/2016   Procedure: INSERTION of new right atrial lead and generator device ;  Surgeon: Marinus Maw, MD;  Location: [facility] OR;  Service: Cardiovascular;  Laterality: N/A;   LUMBAR LAMINECTOMY  1990s   PACEMAKER LEAD REMOVAL  06/11/2016   INSERTION of new right atrial lead and generator device (   PACEMAKER LEAD REMOVAL N/A 06/11/2016   Procedure: PACEMAKER LEAD REMOVAL ;  Surgeon: Marinus Maw, MD;  Location[facility] OR;  Service: Cardiovascular;  Laterality: N/A;   TEE WITHOUT CARDIOVERSION N/A 06/11/2016   Procedure: TRANSESOPHAGEAL ECHOCARDIOGRAM (TEE);  Surgeon: Marinus Maw, MD;  Location: [facility] OR;  Service: Cardiovascular;  Laterality: N/A;    Current Outpatient Medications  Medication Sig Dispense Refill   Glucosamine-Chondroit-Vit C-Mn (GLUCOSAMINE 1500 COMPLEX PO) Take 1 tablet by mouth daily.      losartan (COZAAR) 25 MG tablet Take 1 tablet (25 mg total) by mouth daily. 90 tablet 3   metoprolol tartrate (LOPRESSOR) 25 MG tablet Take 1 tablet by mouth twice  daily 180 tablet 0   nitroGLYCERIN (NITROSTAT) 0.4 MG SL tablet Place 0.4 mg under the tongue every 5 (five) minutes as needed for chest pain (MAX 3 TABLETS). Reported on 07/02/2016     rosuvastatin (CRESTOR) 40 MG tablet Take 1 tablet (40 mg total) by mouth daily. 90 tablet 3   Vitamin D, Ergocalciferol, (DRISDOL) 1.25 MG (50000 UT) CAPS capsule Take 1 capsule (50,000 Units total) by mouth every 7 (seven) days. 12 capsule 0   XARELTO 20 MG TABS tablet TAKE 1 TABLET BY MOUTH EVERY DAY WITH FOOD 90 tablet 1   No current facility-administered medications for this visit.    Allergies:   Lisinopril   Social History:  The patient  reports that he  quit smoking about 12 years ago. His smoking use included cigarettes. He has a 25.00 pack-year smoking history. He has quit using smokeless tobacco. He reports current alcohol use of about 2.0 standard drinks of alcohol per week. He reports that he does not use drugs.   Family History:  The patient's   family history includes Coronary artery disease in an other family member; Diabetes in his maternal grandfather, maternal grandmother, and another family member; Heart attack in his father and paternal grandfather; Hyperlipidemia in an other family member; Hypertension in an other family member.   ROS:  Please see the history of present illness.   All other systems are personally reviewed and negative.    Exam:    Vital Signs:  Ht 6' (1.829 m)    Wt (!) 216 lb (98 kg)    BMI 29.29 kg/m     Well appearing, alert and conversant, regular work of breathing,  good skin color Eyes- anicteric, neuro- grossly intact, skin- no apparent rash or lesions or cyanosis, mouth- oral mucosa is pink   Labs/Other Tests and Data Reviewed:    Recent Labs: 09/14/2019: ALT 19; BUN 17; Creatinine, Ser 0.97; Hemoglobin 16.2; Platelets 215.0; Potassium 4.3; Sodium 143; TSH 1.59   Wt Readings from Last 3 Encounters:  07/04/20 (!) 216 lb (98 kg)  09/14/19 220 lb (99.8 kg)  06/15/19 219 lb (99.3 kg)     Other studies personally reviewed: Additional studies/ records that were reviewed today include:As above    Last device remote is reviewed from [employer] PDF dated 7/21 which reveals  Normal  device function,   arrhythmias -none  ASSESSMENT & PLAN:    Sinus node dysfunction  Atrial fibrillation-paroxysmal  Coronary artery disease with prior DES stenting  Pacemaker-St. Jude       Atrial impedance-low  replacement  Hyperlipidemia  Covid NON vaccinated    No arrhythmia  Euvolemic continue current meds  Without symptoms of ischemia  Non vaccinated   Discussed the benefits esp with the emerging  delta variant with R0 of 3.0 recently  LDL at goal  Continue statin    COVID 19 screen The patient denies symptoms of COVID 19 at this time.  The importance of social distancing was discussed today.  Follow-up:  30 m Next remote: As Scheduled   Current medicines are reviewed at length with the patient today.   The patient does not have concerns regarding his medicines.  The following changes were made today:  none  Labs/ tests ordered today include: none  No orders of the defined types were placed in this encounter.   Future tests ( post COVID )    Patient Risk:  after full review of this patients clinical status, I feel that  they are at moderate risk at this time.  Today, I have spent 5 minutes with the patient with telehealth technology discussing the above.  Signed, Sherryl Manges, MD  07/04/2020 2:13 PM     [facility] [address] [place] [address] [phone] (office) [phone] (fax)

## 2020-07-14 ENCOUNTER — Other Ambulatory Visit: Payer: Self-pay | Admitting: Internal Medicine

## 2020-07-19 ENCOUNTER — Other Ambulatory Visit: Payer: Self-pay | Admitting: Internal Medicine

## 2020-07-19 NOTE — Telephone Encounter (Signed)
Please refill as per office routine med refill policy (all routine meds refilled for 3 mo or monthly per pt preference up to one year from last visit, then month to month grace period for 3 mo, then further med refills will have to be denied)  

## 2020-08-28 ENCOUNTER — Other Ambulatory Visit: Payer: Self-pay | Admitting: Internal Medicine

## 2020-08-28 NOTE — Telephone Encounter (Signed)
Please refill as per office routine med refill policy (all routine meds refilled for 3 mo or monthly per pt preference up to one year from last visit, then month to month grace period for 3 mo, then further med refills will have to be denied)  

## 2020-08-30 NOTE — Progress Notes (Signed)
Virtual Visit via Telephone Note   This visit type was conducted due to national recommendations for restrictions regarding the COVID-19 Pandemic (e.g. social distancing) in an effort to limit this patient's exposure and mitigate transmission in our community.  Due to his co-morbid illnesses, this patient is at least at moderate risk for complications without adequate follow up.  This format is felt to be most appropriate for this patient at this time.  The patient did not have access to video technology/had technical difficulties with video requiring transitioning to audio format only (telephone).  All issues noted in this document were discussed and addressed.  No physical exam could be performed with this format.  Please refer to the patient's chart for his  consent to telehealth for [facility].    Date:  09/03/2020   ID:  [name], DOB May 09, 1966, MRN [id number] The patient was identified using 2 identifiers.  Patient Location: Home Provider Location: Office/Clinic  PCP:  Corwin Levins, MD  Cardiologist:  Tonny Bollman, MD  Electrophysiologist: Dr. Graciela Husbands  Evaluation Performed:  Follow-Up Visit  Chief Complaint: Follow up   History of Present Illness:    [name] is a 54 y.o. male with hx of CAD s/p CABG, sinus node dysfunction with  pacemaker implantation in 2008 with generator replacement 5/17 with new atrial lead, HTN, PAF and HLD seen for follow up  He initially presented in 2010 with an acute inferior MI treated with complex stenting of the right coronary artery using 2 overlapping drug-eluting stents. He's had preserved LV systolic function.  Seen virtually for yearly follow-up.  No complaints.  He walks on treadmill 2 miles 3 days a week without any exertional limitation.  Denies chest pain, shortness of breath, orthopnea, PND, syncope, lower extremity edema or melena.  Lipids managed by PCP.  The patient does not have symptoms concerning for COVID-19 infection  (fever, chills, cough, or new shortness of breath).    Past Medical History:  Diagnosis Date  . Atrial fibrillation (HCC)   . Atrial lead impedance-intermittently low 05/30/2012  . CAD, NATIVE VESSEL 09/11/2009  . DISC DISEASE, LUMBAR   . GERD (gastroesophageal reflux disease)   . Glucose intolerance (impaired glucose tolerance)   . H/O: Bell's palsy   . Hiatal hernia   . Hyperlipidemia   . HYPERSOMNIA, ASSOCIATED WITH SLEEP APNEA   . Hypertension   . LIBIDO, DECREASED   . Lumbar disc disease   . Myocardial infarction (HCC) 2010  . Pacemaker-st judes 2012  . PULMONARY NODULE 05/03/2008  . Sinoatrial node dysfunction (HCC)   . Syncope    Past Surgical History:  Procedure Laterality Date  . BACK SURGERY    . CARDIAC PACEMAKER PLACEMENT  [address] [place] [phone]  . CORONARY ANGIOPLASTY WITH STENT PLACEMENT  2010  . EP IMPLANTABLE DEVICE N/A 06/11/2016   Procedure: INSERTION of new right atrial lead and generator device ;  Surgeon: Marinus Maw, MD;  Location: [facility] OR;  Service: Cardiovascular;  Laterality: N/A;  . LUMBAR LAMINECTOMY  1990s  . PACEMAKER LEAD REMOVAL  06/11/2016   INSERTION of new right atrial lead and generator device (  . PACEMAKER LEAD REMOVAL N/A 06/11/2016   Procedure: PACEMAKER LEAD REMOVAL ;  Surgeon: Marinus Maw, MD;  Location[facility] OR;  Service: Cardiovascular;  Laterality: N/A;  . TEE WITHOUT CARDIOVERSION N/A 06/11/2016   Procedure: TRANSESOPHAGEAL ECHOCARDIOGRAM (TEE);  Surgeon: Marinus Maw, MD;  Location: [facility] OR;  Service: Cardiovascular;  Laterality: N/A;     Current Meds  Medication Sig  . Glucosamine-Chondroit-Vit C-Mn (GLUCOSAMINE 1500 COMPLEX PO) Take 1 tablet by mouth daily.   Marland Kitchen losartan (COZAAR) 25 MG tablet TAKE 1 TABLET BY MOUTH EVERY DAY  . metoprolol tartrate (LOPRESSOR) 25 MG tablet Take 1 tablet by mouth twice daily  . Multiple Vitamin (MULTIVITAMIN) tablet Take 1 tablet by mouth daily.  . nitroGLYCERIN (NITROSTAT) 0.4 MG SL tablet  Place 0.4 mg under the tongue every 5 (five) minutes as needed for chest pain (MAX 3 TABLETS). Reported on 07/02/2016  . rosuvastatin (CRESTOR) 40 MG tablet TAKE 1 TABLET BY MOUTH EVERY DAY  . XARELTO 20 MG TABS tablet TAKE 1 TABLET BY MOUTH EVERY DAY WITH FOOD  . [DISCONTINUED] Vitamin D, Ergocalciferol, (DRISDOL) 1.25 MG (50000 UT) CAPS capsule Take 1 capsule (50,000 Units total) by mouth every 7 (seven) days.     Allergies:   Lisinopril   Social History   Tobacco Use  . Smoking status: Former Smoker    Packs/day: 1.00    Years: 25.00    Pack years: 25.00    Types: Cigarettes    Quit date: 08/15/2007    Years since quitting: 13.0  . Smokeless tobacco: Former Engineer, water Use Topics  . Alcohol use: Yes    Alcohol/week: 2.0 standard drinks    Types: 2 Shots of liquor per week  . Drug use: No     Family Hx: The patient's family history includes Coronary artery disease in an other family member; Diabetes in his maternal grandfather, maternal grandmother, and another family member; Heart attack in his father and paternal grandfather; Hyperlipidemia in an other family member; Hypertension in an other family member.  ROS:   Please see the history of present illness.    All other systems reviewed and are negative.   Prior CV studies:   The following studies were reviewed today:  Intraoperative Transesophageal Echocardiography  05/2016 Study Conclusions   - Left ventricle: There was mild concentric hypertrophy. Systolic  function was mildly reduced. The estimated ejection fraction was  in the range of 45% to 50%.  - Aortic valve: No evidence of vegetation.  - Mitral valve: No evidence of vegetation.  - Right atrium: No evidence of thrombus in the atrial cavity or  appendage.  - Atrial septum: No defect or patent foramen ovale was identified.  - Tricuspid valve: No evidence of vegetation.  - Post removal echo showed no pericardial effusion. LV was  unchanged. Probe  removed uneventfully.   Labs/Other Tests and Data Reviewed:    EKG:  No ECG reviewed.  Recent Labs: 09/14/2019: ALT 19; BUN 17; Creatinine, Ser 0.97; Hemoglobin 16.2; Platelets 215.0; Potassium 4.3; Sodium 143; TSH 1.59   Recent Lipid Panel Lab Results  Component Value Date/Time   CHOL 126 09/14/2019 04:05 PM   TRIG 133.0 09/14/2019 04:05 PM   HDL 38.80 (L) 09/14/2019 04:05 PM   CHOLHDL 3 09/14/2019 04:05 PM   LDLCALC 60 09/14/2019 04:05 PM    Wt Readings from Last 3 Encounters:  09/03/20 216 lb (98 kg)  07/04/20 (!) 216 lb (98 kg)  09/14/19 220 lb (99.8 kg)     Objective:    Vital Signs:  Ht 6' (1.829 m)   Wt 216 lb (98 kg)   BMI 29.29 kg/m    VITAL SIGNS:  reviewed GEN:  no acute distress PSYCH:  normal affect  ASSESSMENT & PLAN:    1. CAD  s/p CABG -No angina.  No change in therapy.  2. HTN -Not checking regularly.  Reports stays normal at Dr. Isidore Moos. -Continue current therapy.  3. PAF -No bleeding issue.  Continue Xarelto. -Intermittent palpitation but not sustained.  4. HLD -Continue statin.  Followed by Dr. Jonny Ruiz  5. SSS s/p PPM - Folllowed by Dr. Graciela Husbands   COVID-19 Education: The signs and symptoms of COVID-19 were discussed with the patient and how to seek care for testing (follow up with PCP or arrange E-visit).  The importance of social distancing was discussed today.  Time:   Today, I have spent 5 minutes with the patient with telehealth technology discussing the above problems.     Medication Adjustments/Labs and Tests Ordered: Current medicines are reviewed at length with the patient today.  Concerns regarding medicines are outlined above.   Tests Ordered: No orders of the defined types were placed in this encounter.   Medication Changes: No orders of the defined types were placed in this encounter.   Follow Up:  Virtual Visit  in 1 year(s)  Signed, Manson Passey, PA  09/03/2020 8:15 AM     [facility]

## 2020-09-03 ENCOUNTER — Other Ambulatory Visit: Payer: Self-pay

## 2020-09-03 ENCOUNTER — Telehealth (INDEPENDENT_AMBULATORY_CARE_PROVIDER_SITE_OTHER): Payer: BC Managed Care – PPO | Admitting: Physician Assistant

## 2020-09-03 ENCOUNTER — Encounter: Payer: Self-pay | Admitting: Physician Assistant

## 2020-09-03 VITALS — Ht 72.0 in | Wt 216.0 lb

## 2020-09-03 DIAGNOSIS — I10 Essential (primary) hypertension: Secondary | ICD-10-CM

## 2020-09-03 DIAGNOSIS — I480 Paroxysmal atrial fibrillation: Secondary | ICD-10-CM

## 2020-09-03 DIAGNOSIS — I2510 Atherosclerotic heart disease of native coronary artery without angina pectoris: Secondary | ICD-10-CM | POA: Diagnosis not present

## 2020-09-03 DIAGNOSIS — E782 Mixed hyperlipidemia: Secondary | ICD-10-CM

## 2020-09-03 NOTE — Patient Instructions (Signed)

## 2020-09-16 ENCOUNTER — Encounter: Payer: Self-pay | Admitting: Internal Medicine

## 2020-09-16 ENCOUNTER — Ambulatory Visit (INDEPENDENT_AMBULATORY_CARE_PROVIDER_SITE_OTHER): Payer: BC Managed Care – PPO | Admitting: Internal Medicine

## 2020-09-16 ENCOUNTER — Other Ambulatory Visit (INDEPENDENT_AMBULATORY_CARE_PROVIDER_SITE_OTHER): Payer: BC Managed Care – PPO

## 2020-09-16 ENCOUNTER — Other Ambulatory Visit: Payer: Self-pay

## 2020-09-16 VITALS — BP 110/80 | HR 64 | Temp 98.3°F | Ht 72.0 in | Wt 220.0 lb

## 2020-09-16 DIAGNOSIS — R739 Hyperglycemia, unspecified: Secondary | ICD-10-CM | POA: Diagnosis not present

## 2020-09-16 DIAGNOSIS — Z125 Encounter for screening for malignant neoplasm of prostate: Secondary | ICD-10-CM

## 2020-09-16 DIAGNOSIS — E559 Vitamin D deficiency, unspecified: Secondary | ICD-10-CM

## 2020-09-16 DIAGNOSIS — Z0000 Encounter for general adult medical examination without abnormal findings: Secondary | ICD-10-CM | POA: Diagnosis not present

## 2020-09-16 LAB — CBC WITH DIFFERENTIAL/PLATELET
Basophils Absolute: 0.1 10*3/uL (ref 0.0–0.1)
Basophils Relative: 0.9 % (ref 0.0–3.0)
Eosinophils Absolute: 0.3 10*3/uL (ref 0.0–0.7)
Eosinophils Relative: 4.8 % (ref 0.0–5.0)
HCT: 43.3 % (ref 39.0–52.0)
Hemoglobin: 15.2 g/dL (ref 13.0–17.0)
Lymphocytes Relative: 30.5 % (ref 12.0–46.0)
Lymphs Abs: 1.7 10*3/uL (ref 0.7–4.0)
MCHC: 35.1 g/dL (ref 30.0–36.0)
MCV: 92.1 fl (ref 78.0–100.0)
Monocytes Absolute: 0.5 10*3/uL (ref 0.1–1.0)
Monocytes Relative: 9.8 % (ref 3.0–12.0)
Neutro Abs: 3 10*3/uL (ref 1.4–7.7)
Neutrophils Relative %: 54 % (ref 43.0–77.0)
Platelets: 201 10*3/uL (ref 150.0–400.0)
RBC: 4.7 Mil/uL (ref 4.22–5.81)
RDW: 12.5 % (ref 11.5–15.5)
WBC: 5.5 10*3/uL (ref 4.0–10.5)

## 2020-09-16 MED ORDER — ROSUVASTATIN CALCIUM 40 MG PO TABS
40.0000 mg | ORAL_TABLET | Freq: Every day | ORAL | 3 refills | Status: DC
Start: 2020-09-16 — End: 2020-11-26

## 2020-09-16 MED ORDER — METOPROLOL TARTRATE 25 MG PO TABS
25.0000 mg | ORAL_TABLET | Freq: Two times a day (BID) | ORAL | 3 refills | Status: DC
Start: 2020-09-16 — End: 2021-09-17

## 2020-09-16 MED ORDER — RIVAROXABAN 20 MG PO TABS
20.0000 mg | ORAL_TABLET | Freq: Every day | ORAL | 3 refills | Status: DC
Start: 2020-09-16 — End: 2020-10-04

## 2020-09-16 MED ORDER — LOSARTAN POTASSIUM 25 MG PO TABS
25.0000 mg | ORAL_TABLET | Freq: Every day | ORAL | 3 refills | Status: DC
Start: 2020-09-16 — End: 2021-04-07

## 2020-09-16 NOTE — Patient Instructions (Signed)

## 2020-09-16 NOTE — Progress Notes (Signed)
Subjective:    Patient ID: [name], male    DOB: 12-Oct-1966, 54 y.o.   MRN: [id number]  HPI  Here for wellness and f/u;  Overall doing ok;  Pt denies Chest pain, worsening SOB, DOE, wheezing, orthopnea, PND, worsening LE edema, palpitations, dizziness or syncope.  Pt denies neurological change such as new headache, facial or extremity weakness.  Pt denies polydipsia, polyuria, or low sugar symptoms. Pt states overall good compliance with treatment and medications, good tolerability, and has been trying to follow appropriate diet.  Pt denies worsening depressive symptoms, suicidal ideation or panic. No fever, night sweats, wt loss, loss of appetite, or other constitutional symptoms.  Pt states good ability with ADL's, has low fall risk, home safety reviewed and adequate, no other significant changes in hearing or vision, and only occasionally active with exercise. No new complaints Past Medical History:  Diagnosis Date  . Atrial fibrillation (HCC)   . Atrial lead impedance-intermittently low 05/30/2012  . CAD, NATIVE VESSEL 09/11/2009  . DISC DISEASE, LUMBAR   . GERD (gastroesophageal reflux disease)   . Glucose intolerance (impaired glucose tolerance)   . H/O: Bell's palsy   . Hiatal hernia   . Hyperlipidemia   . HYPERSOMNIA, ASSOCIATED WITH SLEEP APNEA   . Hypertension   . LIBIDO, DECREASED   . Lumbar disc disease   . Myocardial infarction (HCC) 2010  . Pacemaker-st judes 2012  . PULMONARY NODULE 05/03/2008  . Sinoatrial node dysfunction (HCC)   . Syncope    Past Surgical History:  Procedure Laterality Date  . BACK SURGERY    . CARDIAC PACEMAKER PLACEMENT  [address] [place] [phone]  . CORONARY ANGIOPLASTY WITH STENT PLACEMENT  2010  . EP IMPLANTABLE DEVICE N/A 06/11/2016   Procedure: INSERTION of new right atrial lead and generator device ;  Surgeon: Marinus Maw, MD;  Location: [facility] OR;  Service: Cardiovascular;  Laterality: N/A;  . LUMBAR LAMINECTOMY  1990s  . PACEMAKER  LEAD REMOVAL  06/11/2016   INSERTION of new right atrial lead and generator device (  . PACEMAKER LEAD REMOVAL N/A 06/11/2016   Procedure: PACEMAKER LEAD REMOVAL ;  Surgeon: Marinus Maw, MD;  Location: [facility] [place] [facility] OR;  Service: Cardiovascular;  Laterality: N/A;  . TEE WITHOUT CARDIOVERSION N/A 06/11/2016   Procedure: TRANSESOPHAGEAL ECHOCARDIOGRAM (TEE);  Surgeon: Marinus Maw, MD;  Location: [facility] OR;  Service: Cardiovascular;  Laterality: N/A;    reports that he quit smoking about 13 years ago. His smoking use included cigarettes. He has a 25.00 pack-year smoking history. He has quit using smokeless tobacco. He reports current alcohol use of about 2.0 standard drinks of alcohol per week. He reports that he does not use drugs. family history includes Coronary artery disease in an other family member; Diabetes in his maternal grandfather, maternal grandmother, and another family member; Heart attack in his father and paternal grandfather; Hyperlipidemia in an other family member; Hypertension in an other family member. Allergies  Allergen Reactions  . Lisinopril     REACTION: cough   Current Outpatient Medications on File Prior to Visit  Medication Sig Dispense Refill  . Glucosamine-Chondroit-Vit C-Mn (GLUCOSAMINE 1500 COMPLEX PO) Take 1 tablet by mouth daily.     . Multiple Vitamin (MULTIVITAMIN) tablet Take 1 tablet by mouth daily.    . nitroGLYCERIN (NITROSTAT) 0.4 MG SL tablet Place 0.4 mg under the tongue every 5 (five) minutes as needed for chest pain (MAX 3 TABLETS). Reported on 07/02/2016  No current facility-administered medications on file prior to visit.   Review of Systems All otherwise neg per pt     Objective:   Physical Exam BP 110/80 (BP Location: Left Arm, Patient Position: Sitting, Cuff Size: Large)   Pulse 64   Temp 98.3 F (36.8 C) (Oral)   Ht 6' (1.829 m)   Wt 220 lb (99.8 kg)   SpO2 93%   BMI 29.84 kg/m  VS noted,  Constitutional: Pt appears in NAD HENT: Head:  NCAT.  Right Ear: External ear normal.  Left Ear: External ear normal.  Eyes: . Pupils are equal, round, and reactive to light. Conjunctivae and EOM are normal Nose: without d/c or deformity Neck: Neck supple. Gross normal ROM Cardiovascular: Normal rate and regular rhythm.   Pulmonary/Chest: Effort normal and breath sounds without rales or wheezing.  Abd:  Soft, NT, ND, + BS, no organomegaly Neurological: Pt is alert. At baseline orientation, motor grossly intact Skin: Skin is warm. No rashes, other new lesions, no LE edema Psychiatric: Pt behavior is normal without agitation  All otherwise neg per pt Lab Results  Component Value Date   WBC 5.5 09/16/2020   HGB 15.2 09/16/2020   HCT 43.3 09/16/2020   PLT 201.0 09/16/2020   GLUCOSE 81 09/16/2020   CHOL 123 09/16/2020   TRIG 123.0 09/16/2020   HDL 34.90 (L) 09/16/2020   LDLCALC 63 09/16/2020   ALT 17 09/16/2020   AST 19 09/16/2020   NA 141 09/16/2020   K 4.0 09/16/2020   CL 107 09/16/2020   CREATININE 0.95 09/16/2020   BUN 18 09/16/2020   CO2 27 09/16/2020   TSH 1.39 09/16/2020   PSA 0.48 09/16/2020   INR 1.1 08/25/2009   HGBA1C 5.3 09/16/2020      Assessment & Plan:

## 2020-09-17 ENCOUNTER — Encounter: Payer: Self-pay | Admitting: Internal Medicine

## 2020-09-17 LAB — COMPREHENSIVE METABOLIC PANEL
ALT: 17 U/L (ref 0–53)
AST: 19 U/L (ref 0–37)
Albumin: 4.4 g/dL (ref 3.5–5.2)
Alkaline Phosphatase: 37 U/L — ABNORMAL LOW (ref 39–117)
BUN: 18 mg/dL (ref 6–23)
CO2: 27 mEq/L (ref 19–32)
Calcium: 9.1 mg/dL (ref 8.4–10.5)
Chloride: 107 mEq/L (ref 96–112)
Creatinine, Ser: 0.95 mg/dL (ref 0.40–1.50)
GFR: 81.95 mL/min (ref >60.00)
Glucose, Bld: 81 mg/dL (ref 70–99)
Potassium: 4 mEq/L (ref 3.5–5.1)
Sodium: 141 mEq/L (ref 135–145)
Total Bilirubin: 0.9 mg/dL (ref 0.2–1.2)
Total Protein: 7 g/dL (ref 6.0–8.3)

## 2020-09-17 LAB — URINALYSIS, ROUTINE W REFLEX MICROSCOPIC
Bilirubin Urine: NEGATIVE
Hgb urine dipstick: NEGATIVE
Ketones, ur: NEGATIVE
Leukocytes,Ua: NEGATIVE
Nitrite: NEGATIVE
RBC / HPF: NONE SEEN (ref 0–?)
Specific Gravity, Urine: 1.02 (ref 1.000–1.030)
Total Protein, Urine: NEGATIVE
Urine Glucose: NEGATIVE
Urobilinogen, UA: 1 (ref 0.0–1.0)
WBC, UA: NONE SEEN (ref 0–?)
pH: 7 (ref 5.0–8.0)

## 2020-09-17 LAB — LIPID PANEL
Cholesterol: 123 mg/dL (ref 0–200)
HDL: 34.9 mg/dL — ABNORMAL LOW (ref >39.00)
LDL Cholesterol: 63 mg/dL (ref 0–99)
NonHDL: 87.93
Total CHOL/HDL Ratio: 4
Triglycerides: 123 mg/dL (ref 0.0–149.0)
VLDL: 24.6 mg/dL (ref 0.0–40.0)

## 2020-09-17 LAB — PSA: PSA: 0.48 ng/mL (ref 0.10–4.00)

## 2020-09-17 LAB — TSH: TSH: 1.39 u[IU]/mL (ref 0.35–4.50)

## 2020-09-17 LAB — HEMOGLOBIN A1C: Hgb A1c MFr Bld: 5.3 % (ref 4.6–6.5)

## 2020-09-17 LAB — VITAMIN D 25 HYDROXY (VIT D DEFICIENCY, FRACTURES): VITD: 28.6 ng/mL — ABNORMAL LOW (ref 30.00–100.00)

## 2020-09-18 ENCOUNTER — Ambulatory Visit (INDEPENDENT_AMBULATORY_CARE_PROVIDER_SITE_OTHER): Payer: BC Managed Care – PPO

## 2020-09-18 DIAGNOSIS — I480 Paroxysmal atrial fibrillation: Secondary | ICD-10-CM | POA: Diagnosis not present

## 2020-09-19 LAB — CUP PACEART REMOTE DEVICE CHECK
Battery Remaining Longevity: 120 mo
Battery Remaining Percentage: 95.5 %
Battery Voltage: 3.01 V
Brady Statistic AP VP Percent: 1 %
Brady Statistic AP VS Percent: 59 %
Brady Statistic AS VP Percent: 1 %
Brady Statistic AS VS Percent: 41 %
Brady Statistic RA Percent Paced: 59 %
Brady Statistic RV Percent Paced: 1 %
Date Time Interrogation Session: 20211006020022
Implantable Lead Implant Date: 20080909
Implantable Lead Implant Date: 20170629
Implantable Lead Location: 753859
Implantable Lead Location: 753860
Implantable Pulse Generator Implant Date: 20170629
Lead Channel Impedance Value: 360 Ohm
Lead Channel Impedance Value: 460 Ohm
Lead Channel Pacing Threshold Amplitude: 0.5 V
Lead Channel Pacing Threshold Amplitude: 1 V
Lead Channel Pacing Threshold Pulse Width: 0.4 ms
Lead Channel Pacing Threshold Pulse Width: 0.4 ms
Lead Channel Sensing Intrinsic Amplitude: 1 mV
Lead Channel Sensing Intrinsic Amplitude: 12 mV
Lead Channel Setting Pacing Amplitude: 2 V
Lead Channel Setting Pacing Amplitude: 2.5 V
Lead Channel Setting Pacing Pulse Width: 0.4 ms
Lead Channel Setting Sensing Sensitivity: 2 mV
Pulse Gen Model: 2272
Pulse Gen Serial Number: 7916025

## 2020-09-22 ENCOUNTER — Encounter: Payer: Self-pay | Admitting: Internal Medicine

## 2020-09-22 NOTE — Assessment & Plan Note (Signed)
stable overall by history and exam, recent data reviewed with pt, and pt to continue medical treatment as before,  to f/u any worsening symptoms or concerns  

## 2020-09-22 NOTE — Assessment & Plan Note (Signed)
For oral replacement 

## 2020-09-22 NOTE — Assessment & Plan Note (Signed)

## 2020-09-23 NOTE — Progress Notes (Signed)
Remote pacemaker transmission.   

## 2020-10-04 ENCOUNTER — Other Ambulatory Visit: Payer: Self-pay

## 2020-10-04 DIAGNOSIS — I480 Paroxysmal atrial fibrillation: Secondary | ICD-10-CM

## 2020-10-04 MED ORDER — RIVAROXABAN 20 MG PO TABS: 20.0000 mg | ORAL_TABLET | Freq: Every day | ORAL | 1 refills | Status: DC

## 2020-10-04 NOTE — Telephone Encounter (Signed)
Prescription refill request for Xarelto received.  Indication: a fib Last office visit: 07/04/20 Weight: 99 kg Age: 54  Scr: 0.95 CrCl: 160mL/min

## 2020-11-26 ENCOUNTER — Other Ambulatory Visit: Payer: Self-pay | Admitting: Internal Medicine

## 2020-11-26 NOTE — Telephone Encounter (Signed)
Please refill as per office routine med refill policy (all routine meds refilled for 3 mo or monthly per pt preference up to one year from last visit, then month to month grace period for 3 mo, then further med refills will have to be denied)  

## 2020-12-19 ENCOUNTER — Ambulatory Visit (INDEPENDENT_AMBULATORY_CARE_PROVIDER_SITE_OTHER): Payer: BC Managed Care – PPO

## 2020-12-19 DIAGNOSIS — I480 Paroxysmal atrial fibrillation: Secondary | ICD-10-CM | POA: Diagnosis not present

## 2020-12-19 LAB — CUP PACEART REMOTE DEVICE CHECK
Battery Remaining Longevity: 121 mo
Battery Remaining Percentage: 95.5 %
Battery Voltage: 3.01 V
Brady Statistic AP VP Percent: 1 %
Brady Statistic AP VS Percent: 59 %
Brady Statistic AS VP Percent: 1 %
Brady Statistic AS VS Percent: 40 %
Brady Statistic RA Percent Paced: 59 %
Brady Statistic RV Percent Paced: 1 %
Date Time Interrogation Session: 20220106020014
Implantable Lead Implant Date: 20080909
Implantable Lead Implant Date: 20170629
Implantable Lead Location: 753859
Implantable Lead Location: 753860
Implantable Pulse Generator Implant Date: 20170629
Lead Channel Impedance Value: 360 Ohm
Lead Channel Impedance Value: 480 Ohm
Lead Channel Pacing Threshold Amplitude: 0.5 V
Lead Channel Pacing Threshold Amplitude: 1 V
Lead Channel Pacing Threshold Pulse Width: 0.4 ms
Lead Channel Pacing Threshold Pulse Width: 0.4 ms
Lead Channel Sensing Intrinsic Amplitude: 1.1 mV
Lead Channel Sensing Intrinsic Amplitude: 12 mV
Lead Channel Setting Pacing Amplitude: 2 V
Lead Channel Setting Pacing Amplitude: 2.5 V
Lead Channel Setting Pacing Pulse Width: 0.4 ms
Lead Channel Setting Sensing Sensitivity: 2 mV
Pulse Gen Model: 2272
Pulse Gen Serial Number: 7916025

## 2021-01-02 NOTE — Progress Notes (Signed)
Remote pacemaker transmission.   

## 2021-03-20 ENCOUNTER — Ambulatory Visit (INDEPENDENT_AMBULATORY_CARE_PROVIDER_SITE_OTHER): Payer: BC Managed Care – PPO

## 2021-03-20 DIAGNOSIS — I480 Paroxysmal atrial fibrillation: Secondary | ICD-10-CM | POA: Diagnosis not present

## 2021-03-20 LAB — CUP PACEART REMOTE DEVICE CHECK
Battery Remaining Longevity: 120 mo
Battery Remaining Percentage: 95.5 %
Battery Voltage: 3.01 V
Brady Statistic AP VP Percent: 1 %
Brady Statistic AP VS Percent: 59 %
Brady Statistic AS VP Percent: 1 %
Brady Statistic AS VS Percent: 41 %
Brady Statistic RA Percent Paced: 59 %
Brady Statistic RV Percent Paced: 1 %
Date Time Interrogation Session: 20220407020033
Implantable Lead Implant Date: 20080909
Implantable Lead Implant Date: 20170629
Implantable Lead Location: 753859
Implantable Lead Location: 753860
Implantable Pulse Generator Implant Date: 20170629
Lead Channel Impedance Value: 360 Ohm
Lead Channel Impedance Value: 440 Ohm
Lead Channel Pacing Threshold Amplitude: 0.5 V
Lead Channel Pacing Threshold Amplitude: 1 V
Lead Channel Pacing Threshold Pulse Width: 0.4 ms
Lead Channel Pacing Threshold Pulse Width: 0.4 ms
Lead Channel Sensing Intrinsic Amplitude: 0.8 mV
Lead Channel Sensing Intrinsic Amplitude: 11.1 mV
Lead Channel Setting Pacing Amplitude: 2 V
Lead Channel Setting Pacing Amplitude: 2.5 V
Lead Channel Setting Pacing Pulse Width: 0.4 ms
Lead Channel Setting Sensing Sensitivity: 2 mV
Pulse Gen Model: 2272
Pulse Gen Serial Number: 7916025

## 2021-03-21 ENCOUNTER — Other Ambulatory Visit: Payer: Self-pay | Admitting: Internal Medicine

## 2021-03-21 DIAGNOSIS — I480 Paroxysmal atrial fibrillation: Secondary | ICD-10-CM

## 2021-03-21 NOTE — Telephone Encounter (Signed)
Prescription refill request for Xarelto received.   Indication: afib  Last office visit: 09/03/2020, Bhagat  Weight: 99.8 kg  Age: 55 yo  Scr: 0.95, 09/16/2020 CrCl: 175ml/min   Pt is on the correct dose of Xarelto per dosing criteria, prescription refill sent for Xarelto 20mg  daily.

## 2021-04-02 NOTE — Progress Notes (Signed)
Remote pacemaker transmission.   

## 2021-04-07 ENCOUNTER — Other Ambulatory Visit: Payer: Self-pay | Admitting: Internal Medicine

## 2021-04-24 MED ORDER — LOSARTAN POTASSIUM 25 MG PO TABS
25.0000 mg | ORAL_TABLET | Freq: Every day | ORAL | 1 refills | Status: DC
Start: 2021-04-24 — End: 2022-01-08

## 2021-04-24 NOTE — Addendum Note (Signed)
Addended by: Margaret Pyle D on: 04/24/2021 08:39 AM   Modules accepted: Orders

## 2021-06-19 ENCOUNTER — Ambulatory Visit (INDEPENDENT_AMBULATORY_CARE_PROVIDER_SITE_OTHER): Payer: BC Managed Care – PPO

## 2021-06-19 DIAGNOSIS — I495 Sick sinus syndrome: Secondary | ICD-10-CM | POA: Diagnosis not present

## 2021-06-19 LAB — CUP PACEART REMOTE DEVICE CHECK
Battery Remaining Longevity: 69 mo
Battery Remaining Percentage: 59 %
Battery Voltage: 2.99 V
Brady Statistic AP VP Percent: 1 %
Brady Statistic AP VS Percent: 59 %
Brady Statistic AS VP Percent: 1 %
Brady Statistic AS VS Percent: 41 %
Brady Statistic RA Percent Paced: 59 %
Brady Statistic RV Percent Paced: 1 %
Date Time Interrogation Session: 20220707020024
Implantable Lead Implant Date: 20080909
Implantable Lead Implant Date: 20170629
Implantable Lead Location: 753859
Implantable Lead Location: 753860
Implantable Pulse Generator Implant Date: 20170629
Lead Channel Impedance Value: 350 Ohm
Lead Channel Impedance Value: 480 Ohm
Lead Channel Pacing Threshold Amplitude: 0.5 V
Lead Channel Pacing Threshold Amplitude: 1 V
Lead Channel Pacing Threshold Pulse Width: 0.4 ms
Lead Channel Pacing Threshold Pulse Width: 0.4 ms
Lead Channel Sensing Intrinsic Amplitude: 1.2 mV
Lead Channel Sensing Intrinsic Amplitude: 11.5 mV
Lead Channel Setting Pacing Amplitude: 2 V
Lead Channel Setting Pacing Amplitude: 2.5 V
Lead Channel Setting Pacing Pulse Width: 0.4 ms
Lead Channel Setting Sensing Sensitivity: 2 mV
Pulse Gen Model: 2272
Pulse Gen Serial Number: 7916025

## 2021-07-11 NOTE — Progress Notes (Signed)
Remote pacemaker transmission.   

## 2021-08-01 ENCOUNTER — Ambulatory Visit: Payer: BC Managed Care – PPO | Admitting: Internal Medicine

## 2021-08-01 ENCOUNTER — Other Ambulatory Visit: Payer: Self-pay

## 2021-08-01 ENCOUNTER — Encounter: Payer: Self-pay | Admitting: Internal Medicine

## 2021-08-01 VITALS — BP 110/72 | HR 64 | Ht 72.0 in | Wt 225.2 lb

## 2021-08-01 DIAGNOSIS — I4891 Unspecified atrial fibrillation: Secondary | ICD-10-CM | POA: Diagnosis not present

## 2021-08-01 DIAGNOSIS — Z950 Presence of cardiac pacemaker: Secondary | ICD-10-CM | POA: Diagnosis not present

## 2021-08-01 DIAGNOSIS — I495 Sick sinus syndrome: Secondary | ICD-10-CM | POA: Diagnosis not present

## 2021-08-01 NOTE — Patient Instructions (Signed)

## 2021-08-01 NOTE — Progress Notes (Signed)
Electrophysiology Office Note  Date:  08/01/2021   ID:  [name], DOB 1965/12/22, MRN [id number]  Location: patient's home  Provider location: [address], [place]  Evaluation Performed: Follow-up visit  PCP:  Corwin Levins, MD  Cardiologist:   [facility] Electrophysiologist:  SK   Chief Complaint:  Sinus node dysfunction and atrial fib  History of Present Illness:    [name] is a 55 y.o. male seen in follow-up for remote pacemaker generator implantation St Jude with generator replacement and new atrial lead 5/17 after Atrial lead extraction (GT)  History of coronary artery disease.  IMI 2010.  Underwent PCI of his RCA with 2 overlapping stents.  Preserved LV function.  Today, he is feeling fine overall    The patient denies chest pain, shortness of breath, nocturnal dyspnea, orthopnea or peripheral edema.  There have been no palpitations, lightheadedness or syncope.   Tolerating meds  Date Cr K LDL HDL Hgb  7/19 0.85  65 36      15.7  10/20  0.97 4.3 60 38.80 16.2  10/21 0.95 4.0 63 34.90 15.2    DATE TEST EF   6/17 TEE 45-50%          Thromboembolic risk factors (, HTN-1, Vasc disease -1) for a CHADSVASc Score of 2   Past Medical History:  Diagnosis Date   Atrial fibrillation (HCC)    Atrial lead impedance-intermittently low 05/30/2012   CAD, NATIVE VESSEL 09/11/2009   DISC DISEASE, LUMBAR    GERD (gastroesophageal reflux disease)    Glucose intolerance (impaired glucose tolerance)    H/O: Bell's palsy    Hiatal hernia    Hyperlipidemia    HYPERSOMNIA, ASSOCIATED WITH SLEEP APNEA    Hypertension    LIBIDO, DECREASED    Lumbar disc disease    Myocardial infarction (HCC) 2010   Pacemaker-st judes 2012   PULMONARY NODULE 05/03/2008   Sinoatrial node dysfunction (HCC)    Syncope     Past Surgical History:  Procedure Laterality Date   BACK SURGERY     CARDIAC PACEMAKER PLACEMENT  2008   St Jude Zephyr 5826   CORONARY ANGIOPLASTY  WITH STENT PLACEMENT  2010   EP IMPLANTABLE DEVICE N/A 06/11/2016   Procedure: INSERTION of new right atrial lead and generator device ;  Surgeon: Marinus Maw, MD;  Location: [facility] OR;  Service: Cardiovascular;  Laterality: N/A;   LUMBAR LAMINECTOMY  1990s   PACEMAKER LEAD REMOVAL  06/11/2016   INSERTION of new right atrial lead and generator device  (   PACEMAKER LEAD REMOVAL N/A 06/11/2016   Procedure: PACEMAKER LEAD REMOVAL ;  Surgeon: Marinus Maw, MD;  Location: [facility] OR;  Service: Cardiovascular;  Laterality: N/A;   TEE WITHOUT CARDIOVERSION N/A 06/11/2016   Procedure: TRANSESOPHAGEAL ECHOCARDIOGRAM (TEE);  Surgeon: Marinus Maw, MD;  Location: [facility] OR;  Service: Cardiovascular;  Laterality: N/A;    Current Outpatient Medications  Medication Sig Dispense Refill   Glucosamine-Chondroit-Vit C-Mn (GLUCOSAMINE 1500 COMPLEX PO) Take 1 tablet by mouth daily.      losartan (COZAAR) 25 MG tablet Take 1 tablet (25 mg total) by mouth daily. 90 tablet 1   metoprolol tartrate (LOPRESSOR) 25 MG tablet Take 1 tablet (25 mg total) by mouth 2 (two) times daily. 180 tablet 3   Multiple Vitamin (MULTIVITAMIN) tablet Take 1 tablet by mouth daily.     nitroGLYCERIN (NITROSTAT) 0.4 MG SL tablet Place 0.4 mg  under the tongue every 5 (five) minutes as needed for chest pain (MAX 3 TABLETS). Reported on 07/02/2016     rosuvastatin (CRESTOR) 40 MG tablet TAKE 1 TABLET BY MOUTH EVERY DAY 90 tablet 3   XARELTO 20 MG TABS tablet TAKE 1 TABLET BY MOUTH DAILY. WITH FOOD. 90 tablet 1   No current facility-administered medications for this visit.    Allergies:   Lisinopril      Exam:    Vital Signs:  BP 110/72   Pulse 64   Ht 6' (1.829 m)   Wt 225 lb 3.2 oz (102.2 kg)   SpO2 94%   BMI 30.54 kg/m    Well developed and well nourished in no acute distress HENT normal Neck supple with JVP-flat Clear Device pocket well healed; without hematoma or erythema.  There is no tethering  Regular rate and rhythm, no 3  murmur Abd-soft with active BS No Clubbing cyanosis  edema Skin-warm and dry A & Oriented  Grossly normal sensory and motor function  ECG sinus at 64 Intervals 18/08/38  Device interrogation demonstrates an atrial high rate event triggered by rapid atrial pacing  at the [facility] with retrograde conduction atrial failure to capture and then atrial fibrillation    ASSESSMENT & PLAN:    Sinus node dysfunction   Atrial fibrillation-paroxysmal   Coronary artery disease with prior DES stenting   Pacemaker-St. Jude        Atrial impedance-low  replacement  Hyperlipidemia  Hypertension    No interval atrial fibrillation.  Continue Xarelto 20 mg a day  No angina.  Continue Lopressor 25 twice daily and Xarelto as well as Crestor 40 mg.  LDL is at target last year  Blood pressure well controlled.  We will continue him on Cozaar 25      Current medicines are reviewed at length with the patient today.   The patient does not have concerns regarding his medicines.  The following changes were made today:  none  Labs/ tests ordered today include: none  No orders of the defined types were placed in this encounter.    I,Mathew Stumpf,acting as a scribe for Sherryl Manges, MD.,have documented all relevant documentation on the behalf of Sherryl Manges, MD,as directed by  Sherryl Manges, MD while in the presence of Sherryl Manges, MD.  I, Sherryl Manges, MD, have reviewed all documentation for this visit. The documentation on 08/01/21 for the exam, diagnosis, procedures, and orders are all accurate and complete.   Signed, Sherryl Manges, MD  08/01/2021 4:16 PM     [facility] [address] [place] [address] [phone] (office) [phone] (fax)

## 2021-08-15 ENCOUNTER — Encounter (HOSPITAL_COMMUNITY): Payer: Self-pay | Admitting: Emergency Medicine

## 2021-08-15 ENCOUNTER — Other Ambulatory Visit: Payer: Self-pay

## 2021-08-15 ENCOUNTER — Emergency Department (HOSPITAL_COMMUNITY)
Admission: EM | Admit: 2021-08-15 | Discharge: 2021-08-15 | Disposition: A | Discharge status: Home / Self Care | Payer: BC Managed Care – PPO | Attending: Emergency Medicine | Admitting: Emergency Medicine

## 2021-08-15 DIAGNOSIS — I2510 Atherosclerotic heart disease of native coronary artery without angina pectoris: Secondary | ICD-10-CM | POA: Diagnosis not present

## 2021-08-15 DIAGNOSIS — R040 Epistaxis: Secondary | ICD-10-CM | POA: Diagnosis not present

## 2021-08-15 DIAGNOSIS — I10 Essential (primary) hypertension: Secondary | ICD-10-CM | POA: Insufficient documentation

## 2021-08-15 DIAGNOSIS — Z955 Presence of coronary angioplasty implant and graft: Secondary | ICD-10-CM | POA: Insufficient documentation

## 2021-08-15 DIAGNOSIS — Z7901 Long term (current) use of anticoagulants: Secondary | ICD-10-CM | POA: Diagnosis not present

## 2021-08-15 DIAGNOSIS — Z950 Presence of cardiac pacemaker: Secondary | ICD-10-CM | POA: Insufficient documentation

## 2021-08-15 DIAGNOSIS — Z87891 Personal history of nicotine dependence: Secondary | ICD-10-CM | POA: Insufficient documentation

## 2021-08-15 DIAGNOSIS — I4891 Unspecified atrial fibrillation: Secondary | ICD-10-CM | POA: Diagnosis not present

## 2021-08-15 MED ORDER — ONDANSETRON 8 MG PO TBDP
8.0000 mg | ORAL_TABLET | Freq: Once | ORAL | Status: DC
  Filled 2021-08-15: qty 1

## 2021-08-15 MED ORDER — MORPHINE SULFATE (PF) 4 MG/ML IV SOLN
4.0000 mg | Freq: Once | INTRAVENOUS | Status: DC
  Filled 2021-08-15: qty 1

## 2021-08-15 MED ORDER — ACETAMINOPHEN 325 MG PO TABS
650.0000 mg | ORAL_TABLET | Freq: Once | ORAL | Status: AC
  Administered 2021-08-15: 650 mg via ORAL
  Filled 2021-08-15: qty 2

## 2021-08-15 MED ORDER — OXYMETAZOLINE HCL 0.05 % NA SOLN
1.0000 | Freq: Once | NASAL | Status: AC
  Administered 2021-08-15: 1 via NASAL
  Filled 2021-08-15: qty 30

## 2021-08-15 MED ORDER — TRANEXAMIC ACID FOR EPISTAXIS
500.0000 mg | Freq: Once | TOPICAL | Status: AC
  Administered 2021-08-15: 500 mg via TOPICAL
  Filled 2021-08-15: qty 10

## 2021-08-15 NOTE — ED Provider Notes (Signed)
I personally evaluated the patient during the encounter and completed a history, physical, procedures, medical decision making to contribute to the overall care of the patient and decision making for the patient briefly, the patient is a 55 y.o. male left-sided nosebleed.  On Xarelto for A. fib.  Unremarkable vitals.  Evaluated after PA has tried Afrin and direct pressure several times.  Still having bleeding from his nare.  Therefore rapid Rhino was placed by myself.  Will observe for a while to see if we achieved hemostasis.  Anticipate discharge home.-Hold Xarelto and follow-up with ENT or primary care doctor.  Understands return precautions.  This chart was dictated using voice recognition software.  Despite best efforts to proofread,  errors can occur which can change the documentation meaning.    EKG Interpretation None         .Epistaxis Management  Date/Time: 08/15/2021 7:39 AM Performed by: Virgina Norfolk, DO Authorized by: Virgina Norfolk, DO   Consent:    Consent obtained:  Verbal   Consent given by:  Patient   Risks, benefits, and alternatives were discussed: yes     Risks discussed:  Bleeding, infection, nasal injury and pain   Alternatives discussed:  Alternative treatment Universal protocol:    Procedure explained and questions answered to patient or proxy's satisfaction: yes     Relevant documents present and verified: yes     Immediately prior to procedure, a time out was called: yes     Patient identity confirmed:  Verbally with patient Anesthesia:    Anesthesia method:  None Procedure details:    Treatment site:  L septum   Treatment method:  Anterior pack   Treatment complexity:  Limited   Treatment episode: recurring   Post-procedure details:    Assessment:  Bleeding stopped   Procedure completion:  Jenna Luo, [place], DO 08/15/21 0740

## 2021-08-15 NOTE — ED Notes (Signed)
ENT cart at bedside

## 2021-08-15 NOTE — ED Provider Notes (Signed)
[facility]EMERGENCY [facility]PT Provider Note   CSN: [id number] Arrival date & time: 08/15/21  0601     History Chief Complaint  Patient presents with   Epistaxis    [name] is a 55 y.o. male.   Epistaxis Associated symptoms: no congestion    Patient presents with epistaxis to the left nare.  Started at lunch yesterday but resolved with pressure.  He had a mixed drink last night and the bleeding got worse around 3 AM this morning.  Has been unable to stop it with direct pressure since then.  Patient takes Xarelto for A. fib, up-to-date on medications.  No missed doses.  No recent sinus surgeries.  Bleeding is worsened by walking and relieving pressure.  Alleviated by applying pressure.  Has not tried any other alleviating factors.  No associated symptoms.  Past Medical History:  Diagnosis Date   Atrial fibrillation (HCC)    Atrial lead impedance-intermittently low 05/30/2012   CAD, NATIVE VESSEL 09/11/2009   DISC DISEASE, LUMBAR    GERD (gastroesophageal reflux disease)    Glucose intolerance (impaired glucose tolerance)    H/O: Bell's palsy    Hiatal hernia    Hyperlipidemia    HYPERSOMNIA, ASSOCIATED WITH SLEEP APNEA    Hypertension    LIBIDO, DECREASED    Lumbar disc disease    Myocardial infarction (HCC) 2010   Pacemaker-st judes 2012   PULMONARY NODULE 05/03/2008   Sinoatrial node dysfunction (HCC)    Syncope     Patient Active Problem List   Diagnosis Date Noted   Vitamin D deficiency 09/16/2020   Acute upper respiratory infection 06/01/2017   Allergic rhinitis 06/01/2017   Left ear hearing loss 02/25/2017   Skin lesion 02/25/2017   SVT (supraventricular tachycardia) (HCC) 06/11/2016   Atrial lead impedance-intermittently low 05/30/2012   Hyperglycemia 10/31/2011   Preventative health care 10/31/2011   Pacemaker-st judes 05/19/2011   ABDOMINAL PAIN OTHER SPECIFIED SITE 12/09/2010   LIBIDO, DECREASED 12/02/2009   Sinoatrial node  dysfunction (HCC) 11/19/2009   HYPERSOMNIA, ASSOCIATED WITH SLEEP APNEA 11/19/2009   CAD (coronary artery disease) 09/11/2009   BELL'S PALSY, RIGHT 11/30/2008   PULMONARY NODULE 05/03/2008   Hyperlipemia 10/12/2007   Depression 10/12/2007   Essential hypertension 10/12/2007   Atrial fibrillation (HCC) 10/12/2007   GERD 10/12/2007   DISC DISEASE, LUMBAR 10/12/2007   LOW BACK PAIN 10/12/2007   BACK PAIN 10/12/2007    Past Surgical History:  Procedure Laterality Date   BACK SURGERY     CARDIAC PACEMAKER PLACEMENT  2008   St Jude Zephyr 5826   CORONARY ANGIOPLASTY WITH STENT PLACEMENT  2010   EP IMPLANTABLE DEVICE N/A 06/11/2016   Procedure: INSERTION of new right atrial lead and generator device ;  Surgeon: Marinus Maw, MD;  Location: [facility] OR;  Service: Cardiovascular;  Laterality: N/A;   LUMBAR LAMINECTOMY  1990s   PACEMAKER LEAD REMOVAL  06/11/2016   INSERTION of new right atrial lead and generator device  (   PACEMAKER LEAD REMOVAL N/A 06/11/2016   Procedure: PACEMAKER LEAD REMOVAL ;  Surgeon: Marinus Maw, MD;  Location: [facility] OR;  Service: Cardiovascular;  Laterality: N/A;   TEE WITHOUT CARDIOVERSION N/A 06/11/2016   Procedure: TRANSESOPHAGEAL ECHOCARDIOGRAM (TEE);  Surgeon: Marinus Maw, MD;  Location: [facility] OR;  Service: Cardiovascular;  Laterality: N/A;       Family History  Problem Relation Age of Onset   Heart attack Father  MI in early 53s   Diabetes Maternal Grandmother    Diabetes Maternal Grandfather    Heart attack Paternal Grandfather    Coronary artery disease Other    Diabetes Other    Hypertension Other    Hyperlipidemia Other     Social History   Tobacco Use   Smoking status: Former    Packs/day: 1.00    Years: 25.00    Pack years: 25.00    Types: Cigarettes    Quit date: 08/15/2007    Years since quitting: 14.0   Smokeless tobacco: Former  Substance Use Topics   Alcohol use: Yes    Alcohol/week: 2.0 standard drinks    Types: 2 Shots of  liquor per week   Drug use: No    Home Medications Prior to Admission medications   Medication Sig Start Date End Date Taking? Authorizing Provider  Glucosamine-Chondroit-Vit C-Mn (GLUCOSAMINE 1500 COMPLEX PO) Take 1 tablet by mouth daily.     [provider]  losartan (COZAAR) 25 MG tablet Take 1 tablet (25 mg total) by mouth daily. 04/24/21   Duke Salvia, MD  metoprolol tartrate (LOPRESSOR) 25 MG tablet Take 1 tablet (25 mg total) by mouth 2 (two) times daily. 09/16/20   Corwin Levins, MD  Multiple Vitamin (MULTIVITAMIN) tablet Take 1 tablet by mouth daily.    [provider]  nitroGLYCERIN (NITROSTAT) 0.4 MG SL tablet Place 0.4 mg under the tongue every 5 (five) minutes as needed for chest pain (MAX 3 TABLETS). Reported on 07/02/2016    [provider]  rosuvastatin (CRESTOR) 40 MG tablet TAKE 1 TABLET BY MOUTH EVERY DAY 11/26/20   Corwin Levins, MD  XARELTO 20 MG TABS tablet TAKE 1 TABLET BY MOUTH DAILY. WITH FOOD. 03/21/21   Marinus Maw, MD    Allergies    Lisinopril  Review of Systems   Review of Systems  HENT:  Positive for nosebleeds. Negative for congestion.   Respiratory:  Negative for shortness of breath.   Cardiovascular:  Negative for chest pain.  Gastrointestinal:  Negative for nausea and vomiting.   Physical Exam Updated Vital Signs BP (!) 153/106   Pulse 61   Temp (!) 97.4 F (36.3 C) (Oral)   Resp 19   SpO2 98%   Physical Exam Vitals and nursing note reviewed. Exam conducted with a chaperone present.  Constitutional:      General: He is not in acute distress.    Appearance: Normal appearance.  HENT:     Head: Normocephalic and atraumatic.     Nose:     Comments: Bright red blood in left nare, actively bleeding.  Appears anterior Eyes:     General: No scleral icterus.    Extraocular Movements: Extraocular movements intact.     Pupils: Pupils are equal, round, and reactive to light.  Skin:    Coloration: Skin is not  jaundiced.  Neurological:     Mental Status: He is alert. Mental status is at baseline.     Coordination: Coordination normal.    ED Results / Procedures / Treatments   Labs (all labs ordered are listed, but only abnormal results are displayed) Labs Reviewed - No data to display  EKG None  Radiology No results found.  Procedures Procedures   Medications Ordered in ED Medications  oxymetazoline (AFRIN) 0.05 % nasal spray 1 spray (1 spray Each Nare Given 08/15/21 6767)    ED Course  I have reviewed the triage vital signs and  the nursing notes.  Pertinent labs & imaging results that were available during my care of the patient were reviewed by me and considered in my medical decision making (see chart for details).    MDM Rules/Calculators/A&P                           Patient is mildly hypertensive, otherwise vitals are stable.  He is in no acute distress.  He does have actively bleeding from the left nare, Afrin ordered and applied.  We will have him apply direct pressure and will reevaluate.  Bleeding not controlled with pressure and Afrin.  Patient is still actively bleeding once he moves around.  Packing with TXA was applied, will reevaluate in 30 minutes.  Hemostasis achieved.  Patient is tolerating the packing well.  He is appropriate for discharge at this time, we will have him hold his Xarelto for the next 2 days.  We will have him return to the ED or follow-up with his primary care and ENT in 3 days to have the packing removed and be reevaluated.  Return precautions given.  Discussed HPI, physical exam and plan of care for this patient with attending Candice Camp. The attending physician evaluated this patient as part of a shared visit and agrees with plan of care.   Final Clinical Impression(s) / ED Diagnoses Final diagnoses:  None    Rx / DC Orders ED Discharge Orders     None        Theron Arista, PA-C 08/15/21 3419    Sabas Sous, MD 08/16/21  [phone]

## 2021-08-15 NOTE — ED Triage Notes (Signed)
Patient arrives complaining of epistaxis. Patient states that his nose was bleeding some throughout the day, but when he blew his nose around 0300, he began having a nose bleed. Patient is on Xarelto and had a mixed drink tonight.

## 2021-08-15 NOTE — Discharge Instructions (Addendum)
Hold Xarelto for the next 2 days. Please follow up with your primary care doctor or ENT to have packing removed.

## 2021-08-17 ENCOUNTER — Encounter: Payer: Self-pay | Admitting: Emergency Medicine

## 2021-08-17 ENCOUNTER — Other Ambulatory Visit: Payer: Self-pay

## 2021-08-17 ENCOUNTER — Ambulatory Visit
Admission: EM | Admit: 2021-08-17 | Discharge: 2021-08-17 | Disposition: A | Discharge status: Home / Self Care | Payer: BC Managed Care – PPO

## 2021-08-17 DIAGNOSIS — R040 Epistaxis: Secondary | ICD-10-CM | POA: Diagnosis not present

## 2021-08-17 NOTE — ED Triage Notes (Signed)
Pt here for rhino rocket to be removed left nare that was placed 2 days ago; no bleeding noted

## 2021-08-17 NOTE — ED Provider Notes (Signed)
[facility]   MRN: [id number] DOB: October 24, 1966  Subjective:   [name] is a 55 y.o. male presenting for reevaluation and removal of the [employer].  Patient had this placed in the emergency room a few days ago.  He is supposed to see an ENT specialist this upcoming week.  Denies fever, persistent epistaxis.  Patient is on Xarelto and they believe that this is the source of his bleeding.  No current facility-administered medications for this encounter.  Current Outpatient Medications:    Glucosamine-Chondroit-Vit C-Mn (GLUCOSAMINE 1500 COMPLEX PO), Take 1 tablet by mouth daily. , Disp: , Rfl:    losartan (COZAAR) 25 MG tablet, Take 1 tablet (25 mg total) by mouth daily., Disp: 90 tablet, Rfl: 1   metoprolol tartrate (LOPRESSOR) 25 MG tablet, Take 1 tablet (25 mg total) by mouth 2 (two) times daily., Disp: 180 tablet, Rfl: 3   Multiple Vitamin (MULTIVITAMIN) tablet, Take 1 tablet by mouth daily., Disp: , Rfl:    nitroGLYCERIN (NITROSTAT) 0.4 MG SL tablet, Place 0.4 mg under the tongue every 5 (five) minutes as needed for chest pain (MAX 3 TABLETS). Reported on 07/02/2016, Disp: , Rfl:    rosuvastatin (CRESTOR) 40 MG tablet, TAKE 1 TABLET BY MOUTH EVERY DAY, Disp: 90 tablet, Rfl: 3   XARELTO 20 MG TABS tablet, TAKE 1 TABLET BY MOUTH DAILY. WITH FOOD., Disp: 90 tablet, Rfl: 1   Allergies  Allergen Reactions   Lisinopril     REACTION: cough    Past Medical History:  Diagnosis Date   Atrial fibrillation (HCC)    Atrial lead impedance-intermittently low 05/30/2012   CAD, NATIVE VESSEL 09/11/2009   DISC DISEASE, LUMBAR    GERD (gastroesophageal reflux disease)    Glucose intolerance (impaired glucose tolerance)    H/O: Bell's palsy    Hiatal hernia    Hyperlipidemia    HYPERSOMNIA, ASSOCIATED WITH SLEEP APNEA    Hypertension    LIBIDO, DECREASED    Lumbar disc disease    Myocardial infarction (HCC) 2010   Pacemaker-st judes 2012   PULMONARY NODULE 05/03/2008    Sinoatrial node dysfunction (HCC)    Syncope      Past Surgical History:  Procedure Laterality Date   BACK SURGERY     CARDIAC PACEMAKER PLACEMENT  2008   St Jude Zephyr 5826   CORONARY ANGIOPLASTY WITH STENT PLACEMENT  2010   EP IMPLANTABLE DEVICE N/A 06/11/2016   Procedure: INSERTION of new right atrial lead and generator device ;  Surgeon: Marinus Maw, MD;  Location: [facility] OR;  Service: Cardiovascular;  Laterality: N/A;   LUMBAR LAMINECTOMY  1990s   PACEMAKER LEAD REMOVAL  06/11/2016   INSERTION of new right atrial lead and generator device  (   PACEMAKER LEAD REMOVAL N/A 06/11/2016   Procedure: PACEMAKER LEAD REMOVAL ;  Surgeon: Marinus Maw, MD;  Location: [facility] OR;  Service: Cardiovascular;  Laterality: N/A;   TEE WITHOUT CARDIOVERSION N/A 06/11/2016   Procedure: TRANSESOPHAGEAL ECHOCARDIOGRAM (TEE);  Surgeon: Marinus Maw, MD;  Location: [facility] OR;  Service: Cardiovascular;  Laterality: N/A;    Family History  Problem Relation Age of Onset   Heart attack Father        MI in early 94s   Diabetes Maternal Grandmother    Diabetes Maternal Grandfather    Heart attack Paternal Grandfather    Coronary artery disease Other    Diabetes Other    Hypertension Other    Hyperlipidemia Other  Social History   Tobacco Use   Smoking status: Former    Packs/day: 1.00    Years: 25.00    Pack years: 25.00    Types: Cigarettes    Quit date: 08/15/2007    Years since quitting: 14.0   Smokeless tobacco: Former  Substance Use Topics   Alcohol use: Yes    Alcohol/week: 2.0 standard drinks    Types: 2 Shots of liquor per week   Drug use: No    ROS   Objective:   Vitals: BP (!) 165/92 (BP Location: Left Arm)   Pulse 82   Temp 98 F (36.7 C) (Oral)   Resp 18   SpO2 95%   Physical Exam Constitutional:      General: He is not in acute distress.    Appearance: Normal appearance. He is well-developed and normal weight. He is not ill-appearing, toxic-appearing or diaphoretic.   HENT:     Head: Normocephalic and atraumatic.     Right Ear: External ear normal.     Left Ear: External ear normal.     Nose: No septal deviation, laceration, mucosal edema, congestion or rhinorrhea.     Left Nostril: No foreign body, epistaxis, septal hematoma or occlusion.     Left Turbinates: Not enlarged, swollen or pale.     Left Sinus: No maxillary sinus tenderness or frontal sinus tenderness.     Comments: Rhino Rocket removed without incident.  Erythematous nasal mucosa.    Mouth/Throat:     Pharynx: Oropharynx is clear.  Eyes:     General: No scleral icterus.       Right eye: No discharge.        Left eye: No discharge.     Extraocular Movements: Extraocular movements intact.     Pupils: Pupils are equal, round, and reactive to light.  Cardiovascular:     Rate and Rhythm: Normal rate.  Pulmonary:     Effort: Pulmonary effort is normal.  Musculoskeletal:     Cervical back: Normal range of motion.  Neurological:     Mental Status: He is alert and oriented to person, place, and time.  Psychiatric:        Mood and Affect: Mood normal.        Behavior: Behavior normal.        Thought Content: Thought content normal.        Judgment: Judgment normal.      Assessment and Plan :   PDMP not reviewed this encounter.  1. Epistaxis     Rhino Rocket removed without incident.  Follow-up with ENT. Counseled patient on potential for adverse effects with medications prescribed/recommended today, ER and return-to-clinic precautions discussed, patient verbalized understanding.    Wallis Bamberg, [place] 08/20/21 [phone]

## 2021-09-07 ENCOUNTER — Other Ambulatory Visit: Payer: Self-pay | Admitting: Internal Medicine

## 2021-09-07 DIAGNOSIS — I480 Paroxysmal atrial fibrillation: Secondary | ICD-10-CM

## 2021-09-07 NOTE — Telephone Encounter (Signed)
Please refill as per office routine med refill policy (all routine meds to be refilled for 3 mo or monthly (per pt preference) up to one year from last visit, then month to month grace period for 3 mo, then further med refills will have to be denied) ? ?

## 2021-09-08 NOTE — Telephone Encounter (Signed)
Xarelto 20 mg refill request received. Pt is 55 years old, weight- 102.2kg, Crea- 0.95 on 09/16/20 via [facility] , last seen by Dr. Graciela Husbands on 08/01/21, Diagnosis-afib, CrCl- 124.01; Dose is appropriate based on dosing criteria. Will send in refill to requested pharmacy.

## 2021-09-17 ENCOUNTER — Other Ambulatory Visit: Payer: Self-pay | Admitting: Internal Medicine

## 2021-09-17 MED ORDER — METOPROLOL TARTRATE 25 MG PO TABS: 25.0000 mg | ORAL_TABLET | Freq: Two times a day (BID) | ORAL | 0 refills | Status: DC

## 2021-09-17 NOTE — Telephone Encounter (Signed)
Ok to contact pt  We could only refill the metoprolol for 1 mo, due to office refill policy  Please make ROV further refills

## 2021-09-18 ENCOUNTER — Ambulatory Visit (INDEPENDENT_AMBULATORY_CARE_PROVIDER_SITE_OTHER): Payer: BC Managed Care – PPO

## 2021-09-18 DIAGNOSIS — I495 Sick sinus syndrome: Secondary | ICD-10-CM

## 2021-09-18 LAB — CUP PACEART REMOTE DEVICE CHECK
Battery Remaining Longevity: 67 mo
Battery Remaining Percentage: 56 %
Battery Voltage: 2.99 V
Brady Statistic AP VP Percent: 1 %
Brady Statistic AP VS Percent: 55 %
Brady Statistic AS VP Percent: 1 %
Brady Statistic AS VS Percent: 44 %
Brady Statistic RA Percent Paced: 54 %
Brady Statistic RV Percent Paced: 1 %
Date Time Interrogation Session: 20221006020012
Implantable Lead Implant Date: 20080909
Implantable Lead Implant Date: 20170629
Implantable Lead Location: 753859
Implantable Lead Location: 753860
Implantable Pulse Generator Implant Date: 20170629
Lead Channel Impedance Value: 360 Ohm
Lead Channel Impedance Value: 490 Ohm
Lead Channel Pacing Threshold Amplitude: 0.5 V
Lead Channel Pacing Threshold Amplitude: 1 V
Lead Channel Pacing Threshold Pulse Width: 0.4 ms
Lead Channel Pacing Threshold Pulse Width: 0.4 ms
Lead Channel Sensing Intrinsic Amplitude: 1.9 mV
Lead Channel Sensing Intrinsic Amplitude: 12 mV
Lead Channel Setting Pacing Amplitude: 2 V
Lead Channel Setting Pacing Amplitude: 2.5 V
Lead Channel Setting Pacing Pulse Width: 0.4 ms
Lead Channel Setting Sensing Sensitivity: 2 mV
Pulse Gen Model: 2272
Pulse Gen Serial Number: 7916025

## 2021-09-26 NOTE — Progress Notes (Signed)
Remote pacemaker transmission.   

## 2021-11-24 ENCOUNTER — Other Ambulatory Visit: Payer: Self-pay | Admitting: Internal Medicine

## 2021-11-24 ENCOUNTER — Telehealth: Payer: Self-pay | Admitting: Internal Medicine

## 2021-11-24 NOTE — Telephone Encounter (Signed)
1.Medication Requested: metoprolol tartrate (LOPRESSOR) 25 MG tablet 2. Pharmacy (Name, Street, [place]): [facility] [address] [place], [place] - [address] Phone:  [phone]  Fax:  [phone]     3. On Med List: Y  4. Last Visit with PCP: 10.4.2021   5. Next visit date with PCP: 12.21.2022    Agent: Please be advised that RX refills may take up to 3 business days. We ask that you follow-up with your pharmacy.

## 2021-11-24 NOTE — Telephone Encounter (Signed)
Please refill as per office routine med refill policy (all routine meds to be refilled for 3 mo or monthly (per pt preference) up to one year from last visit, then month to month grace period for 3 mo, then further med refills will have to be denied) ? ?

## 2021-11-25 MED ORDER — METOPROLOL TARTRATE 25 MG PO TABS: 25.0000 mg | ORAL_TABLET | Freq: Two times a day (BID) | ORAL | 0 refills | Status: DC

## 2021-12-01 ENCOUNTER — Other Ambulatory Visit: Payer: Self-pay | Admitting: Internal Medicine

## 2021-12-01 NOTE — Telephone Encounter (Signed)
Please refill as per office routine med refill policy (all routine meds to be refilled for 3 mo or monthly (per pt preference) up to one year from last visit, then month to month grace period for 3 mo, then further med refills will have to be denied) ? ?

## 2021-12-03 ENCOUNTER — Ambulatory Visit: Payer: BC Managed Care – PPO | Admitting: Internal Medicine

## 2021-12-10 ENCOUNTER — Encounter: Payer: Self-pay | Admitting: Internal Medicine

## 2021-12-10 ENCOUNTER — Ambulatory Visit (INDEPENDENT_AMBULATORY_CARE_PROVIDER_SITE_OTHER): Payer: BC Managed Care – PPO | Admitting: Internal Medicine

## 2021-12-10 ENCOUNTER — Other Ambulatory Visit: Payer: Self-pay

## 2021-12-10 VITALS — BP 136/80 | HR 60 | Temp 97.9°F | Ht 72.0 in | Wt 218.0 lb

## 2021-12-10 DIAGNOSIS — I10 Essential (primary) hypertension: Secondary | ICD-10-CM | POA: Diagnosis not present

## 2021-12-10 DIAGNOSIS — Z0001 Encounter for general adult medical examination with abnormal findings: Secondary | ICD-10-CM | POA: Diagnosis not present

## 2021-12-10 DIAGNOSIS — Z125 Encounter for screening for malignant neoplasm of prostate: Secondary | ICD-10-CM | POA: Diagnosis not present

## 2021-12-10 DIAGNOSIS — E538 Deficiency of other specified B group vitamins: Secondary | ICD-10-CM

## 2021-12-10 DIAGNOSIS — E782 Mixed hyperlipidemia: Secondary | ICD-10-CM

## 2021-12-10 DIAGNOSIS — R040 Epistaxis: Secondary | ICD-10-CM

## 2021-12-10 DIAGNOSIS — E559 Vitamin D deficiency, unspecified: Secondary | ICD-10-CM

## 2021-12-10 DIAGNOSIS — R739 Hyperglycemia, unspecified: Secondary | ICD-10-CM

## 2021-12-10 DIAGNOSIS — Z7901 Long term (current) use of anticoagulants: Secondary | ICD-10-CM | POA: Insufficient documentation

## 2021-12-10 LAB — VITAMIN B12: Vitamin B-12: 201 pg/mL — ABNORMAL LOW (ref 211–911)

## 2021-12-10 LAB — HEPATIC FUNCTION PANEL
ALT: 16 U/L (ref 0–53)
AST: 18 U/L (ref 0–37)
Albumin: 4.3 g/dL (ref 3.5–5.2)
Alkaline Phosphatase: 36 U/L — ABNORMAL LOW (ref 39–117)
Bilirubin, Direct: 0.2 mg/dL (ref 0.0–0.3)
Total Bilirubin: 0.8 mg/dL (ref 0.2–1.2)
Total Protein: 6.6 g/dL (ref 6.0–8.3)

## 2021-12-10 LAB — CBC WITH DIFFERENTIAL/PLATELET
Basophils Absolute: 0.1 10*3/uL (ref 0.0–0.1)
Basophils Relative: 1 % (ref 0.0–3.0)
Eosinophils Absolute: 0.2 10*3/uL (ref 0.0–0.7)
Eosinophils Relative: 4.5 % (ref 0.0–5.0)
HCT: 44.3 % (ref 39.0–52.0)
Hemoglobin: 15.6 g/dL (ref 13.0–17.0)
Lymphocytes Relative: 24.9 % (ref 12.0–46.0)
Lymphs Abs: 1.3 10*3/uL (ref 0.7–4.0)
MCHC: 35.2 g/dL (ref 30.0–36.0)
MCV: 90.9 fl (ref 78.0–100.0)
Monocytes Absolute: 0.4 10*3/uL (ref 0.1–1.0)
Monocytes Relative: 7.8 % (ref 3.0–12.0)
Neutro Abs: 3.2 10*3/uL (ref 1.4–7.7)
Neutrophils Relative %: 61.8 % (ref 43.0–77.0)
Platelets: 213 10*3/uL (ref 150.0–400.0)
RBC: 4.88 Mil/uL (ref 4.22–5.81)
RDW: 12.8 % (ref 11.5–15.5)
WBC: 5.2 10*3/uL (ref 4.0–10.5)

## 2021-12-10 LAB — BASIC METABOLIC PANEL
BUN: 13 mg/dL (ref 6–23)
CO2: 28 mEq/L (ref 19–32)
Calcium: 9.5 mg/dL (ref 8.4–10.5)
Chloride: 107 mEq/L (ref 96–112)
Creatinine, Ser: 0.95 mg/dL (ref 0.40–1.50)
GFR: 88.89 mL/min (ref >60.00)
Glucose, Bld: 102 mg/dL — ABNORMAL HIGH (ref 70–99)
Potassium: 4.4 mEq/L (ref 3.5–5.1)
Sodium: 143 mEq/L (ref 135–145)

## 2021-12-10 LAB — LIPID PANEL
Cholesterol: 117 mg/dL (ref 0–200)
HDL: 35.6 mg/dL — ABNORMAL LOW (ref >39.00)
LDL Cholesterol: 67 mg/dL (ref 0–99)
NonHDL: 81.24
Total CHOL/HDL Ratio: 3
Triglycerides: 70 mg/dL (ref 0.0–149.0)
VLDL: 14 mg/dL (ref 0.0–40.0)

## 2021-12-10 LAB — URINALYSIS, ROUTINE W REFLEX MICROSCOPIC
Bilirubin Urine: NEGATIVE
Hgb urine dipstick: NEGATIVE
Ketones, ur: NEGATIVE
Leukocytes,Ua: NEGATIVE
Nitrite: NEGATIVE
RBC / HPF: NONE SEEN (ref 0–?)
Specific Gravity, Urine: 1.02 (ref 1.000–1.030)
Total Protein, Urine: NEGATIVE
Urine Glucose: NEGATIVE
Urobilinogen, UA: 0.2 (ref 0.0–1.0)
pH: 7 (ref 5.0–8.0)

## 2021-12-10 LAB — HEMOGLOBIN A1C: Hgb A1c MFr Bld: 5.4 % (ref 4.6–6.5)

## 2021-12-10 LAB — VITAMIN D 25 HYDROXY (VIT D DEFICIENCY, FRACTURES): VITD: 76.88 ng/mL (ref 30.00–100.00)

## 2021-12-10 LAB — PSA: PSA: 0.61 ng/mL (ref 0.10–4.00)

## 2021-12-10 LAB — TSH: TSH: 0.78 u[IU]/mL (ref 0.35–5.50)

## 2021-12-10 NOTE — Patient Instructions (Signed)
Please call if you change your mind about the colonoscopy or immunizations, or if you need the ENT referral for the nosebleeds  Please continue all other medications as before, and refills have been done if requested.  Please have the pharmacy call with any other refills you may need.  Please continue your efforts at being more active, low cholesterol diet, and weight control.  You are otherwise up to date with prevention measures today.  Please keep your appointments with your specialists as you may have planned  Please go to the LAB at the blood drawing area for the tests to be done  You will be contacted by phone if any changes need to be made immediately.  Otherwise, you will receive a letter about your results with an explanation, but please check with MyChart first.  Please remember to sign up for MyChart if you have not done so, as this will be important to you in the future with finding out test results, communicating by private email, and scheduling acute appointments online when needed.  Please make an Appointment to return in 6 months, or sooner if needed

## 2021-12-10 NOTE — Assessment & Plan Note (Signed)
Last vitamin D Lab Results  Component Value Date   VD25OH 28.60 (L) 09/16/2020   Low, to start oral replacement

## 2021-12-10 NOTE — Progress Notes (Signed)
Patient ID: BRASEN BUNDREN, male   DOB: May 22, 1966, 55 y.o.   MRN: [id number]         Chief Complaint:: wellness exam and left nosebleed, hyperglycemia, low vit d       HPI:  [name] is a 55 y.o. male here for wellness exam; declines covid booster, shingrix, flu shot, pneumovax, colonoscopy and tdap; o/w up to date                        Also has c/o recent mild left nosebleed on xarelto once weekly for 3 wks, small volume without pain or fever, but none for the past 10 days, and always seems to stop easily.  Pt denies chest pain, increased sob or doe, wheezing, orthopnea, PND, increased LE swelling, palpitations, dizziness or syncope.   Pt denies polydipsia, polyuria, or new focal neuro s/s.  Not taking Vit D.   Pt denies fever, wt loss, night sweats, loss of appetite, or other constitutional symptoms  No other new complaints     Wt Readings from Last 3 Encounters:  12/10/21 218 lb (98.9 kg)  08/01/21 225 lb 3.2 oz (102.2 kg)  09/16/20 220 lb (99.8 kg)   BP Readings from Last 3 Encounters:  12/10/21 136/80  08/17/21 (!) 165/92  08/15/21 (!) 156/98   Immunization History  Administered Date(s) Administered   Pneumococcal Polysaccharide-23 12/09/2010   Td 12/09/2010   There are no preventive care reminders to display for this patient.     Past Medical History:  Diagnosis Date   Atrial fibrillation [facility])    Atrial lead impedance-intermittently low 05/30/2012   CAD, NATIVE VESSEL 09/11/2009   DISC DISEASE, LUMBAR    GERD (gastroesophageal reflux disease)    Glucose intolerance (impaired glucose tolerance)    H/O: Bell's palsy    Hiatal hernia    Hyperlipidemia    HYPERSOMNIA, ASSOCIATED WITH SLEEP APNEA    Hypertension    LIBIDO, DECREASED    Lumbar disc disease    Myocardial infarction (HCC) 2010   Pacemaker-st judes 2012   PULMONARY NODULE 05/03/2008   Sinoatrial node dysfunction (HCC)    Syncope    Past Surgical History:  Procedure Laterality Date   BACK SURGERY      CARDIAC PACEMAKER PLACEMENT  2008   St Jude Zephyr 5826   CORONARY ANGIOPLASTY WITH STENT PLACEMENT  2010   EP IMPLANTABLE DEVICE N/A 06/11/2016   Procedure: INSERTION of new right atrial lead and generator device ;  Surgeon: Marinus Maw, MD;  Location: [facility] OR;  Service: Cardiovascular;  Laterality: N/A;   LUMBAR LAMINECTOMY  1990s   PACEMAKER LEAD REMOVAL  06/11/2016   INSERTION of new right atrial lead and generator device  (   PACEMAKER LEAD REMOVAL N/A 06/11/2016   Procedure: PACEMAKER LEAD REMOVAL ;  Surgeon: Marinus Maw, MD;  Location: [facility] OR;  Service: Cardiovascular;  Laterality: N/A;   TEE WITHOUT CARDIOVERSION N/A 06/11/2016   Procedure: TRANSESOPHAGEAL ECHOCARDIOGRAM (TEE);  Surgeon: Marinus Maw, MD;  Location: [facility] OR;  Service: Cardiovascular;  Laterality: N/A;    reports that he quit smoking about 14 years ago. His smoking use included cigarettes. He has a 25.00 pack-year smoking history. He has quit using smokeless tobacco. He reports current alcohol use of about 2.0 standard drinks per week. He reports that he does not use drugs. family history includes Coronary artery disease in an other family member; Diabetes in his maternal grandfather,  maternal grandmother, and another family member; Heart attack in his father and paternal grandfather; Hyperlipidemia in an other family member; Hypertension in an other family member. Allergies  Allergen Reactions   Lisinopril     REACTION: cough   Current Outpatient Medications on File Prior to Visit  Medication Sig Dispense Refill   losartan (COZAAR) 25 MG tablet Take 1 tablet (25 mg total) by mouth daily. 90 tablet 1   metoprolol tartrate (LOPRESSOR) 25 MG tablet Take 1 tablet (25 mg total) by mouth 2 (two) times daily. 60 tablet 0   nitroGLYCERIN (NITROSTAT) 0.4 MG SL tablet Place 0.4 mg under the tongue every 5 (five) minutes as needed for chest pain (MAX 3 TABLETS). Reported on 07/02/2016     rosuvastatin (CRESTOR) 40 MG tablet  TAKE 1 TABLET BY MOUTH EVERY DAY 90 tablet 3   XARELTO 20 MG TABS tablet TAKE 1 TABLET BY MOUTH DAILY WITH FOOD 90 tablet 1   Multiple Vitamin (MULTIVITAMIN) tablet Take 1 tablet by mouth daily. (Patient not taking: Reported on 12/10/2021)     No current facility-administered medications on file prior to visit.        ROS:  All others reviewed and negative.  Objective        PE:  BP 136/80 (BP Location: Right Arm, Patient Position: Sitting, Cuff Size: Normal)    Pulse 60    Temp 97.9 F (36.6 C) (Oral)    Ht 6' (1.829 m)    Wt 218 lb (98.9 kg)    SpO2 95%    BMI 29.57 kg/m                 Constitutional: Pt appears in NAD               HENT: Head: NCAT.                Right Ear: External ear normal.                 Left Ear: External ear normal.                Eyes: . Pupils are equal, round, and reactive to light. Conjunctivae and EOM are normal               Nose: without d/c or deformity               Neck: Neck supple. Gross normal ROM               Cardiovascular: Normal rate and regular rhythm.                 Pulmonary/Chest: Effort normal and breath sounds without rales or wheezing.                Abd:  Soft, NT, ND, + BS, no organomegaly               Neurological: Pt is alert. At baseline orientation, motor grossly intact               Skin: Skin is warm. No rashes, no other new lesions, LE edema - none               Psychiatric: Pt behavior is normal without agitation   Micro: none  Cardiac tracings I have personally interpreted today:  none  Pertinent Radiological findings (summarize): none   Lab Results  Component Value Date   WBC 5.2 12/10/2021   HGB 15.6 12/10/2021  HCT 44.3 12/10/2021   PLT 213.0 12/10/2021   GLUCOSE 102 (H) 12/10/2021   CHOL 117 12/10/2021   TRIG 70.0 12/10/2021   HDL 35.60 (L) 12/10/2021   LDLCALC 67 12/10/2021   ALT 16 12/10/2021   AST 18 12/10/2021   NA 143 12/10/2021   K 4.4 12/10/2021   CL 107 12/10/2021   CREATININE 0.95  12/10/2021   BUN 13 12/10/2021   CO2 28 12/10/2021   TSH 0.78 12/10/2021   PSA 0.61 12/10/2021   INR 1.1 08/25/2009   HGBA1C 5.4 12/10/2021   Assessment/Plan:  MICHAELJOSEPH REVOLORIO is a 55 y.o. White or Caucasian [1] male with  has a past medical history of Atrial fibrillation (HCC), Atrial lead impedance-intermittently low (05/30/2012), CAD, NATIVE VESSEL (09/11/2009), DISC DISEASE, LUMBAR, GERD (gastroesophageal reflux disease), Glucose intolerance (impaired glucose tolerance), H/O: Bell's palsy, Hiatal hernia, Hyperlipidemia, HYPERSOMNIA, ASSOCIATED WITH SLEEP APNEA, Hypertension, LIBIDO, DECREASED, Lumbar disc disease, Myocardial infarction (HCC) (2010), Pacemaker-st judes (2012), PULMONARY NODULE (05/03/2008), Sinoatrial node dysfunction (HCC), and Syncope.  Vitamin D deficiency Last vitamin D Lab Results  Component Value Date   VD25OH 28.60 (L) 09/16/2020   Low, to start oral replacement   Encounter for well adult exam with abnormal findings Age and sex appropriate education and counseling updated with regular exercise and diet Referrals for preventative services - declines colonoscopy or cologuard Immunizations addressed - declines covid booster, shingrix, flu shot, and pneumovax Smoking counseling  - none needed Evidence for depression or other mood disorder - none significant Most recent labs reviewed. I have personally reviewed and have noted: 1) the patient's medical and social history 2) The patient's current medications and supplements 3) The patient's height, weight, and BMI have been recorded in the chart   Hyperlipemia Lab Results  Component Value Date   LDLCALC 67 12/10/2021   Stable, pt to continue current statin crestor 40   Essential hypertension BP Readings from Last 3 Encounters:  12/10/21 136/80  08/17/21 (!) 165/92  08/15/21 (!) 156/98   Stable, pt to continue medical treatment losartan, lopressor   Hyperglycemia Lab Results  Component Value Date    HGBA1C 5.4 12/10/2021   Stable, pt to continue current medical treatment  - diet   Nosebleed Left sided, small volume easy to control per pt, remains on xarelto, declines ENT for now Followup: Return in about 6 months (around 06/10/2022).  Oliver Barre, MD 12/13/2021 6:36 PM [facility]

## 2021-12-11 ENCOUNTER — Encounter: Payer: Self-pay | Admitting: Internal Medicine

## 2021-12-13 ENCOUNTER — Encounter: Payer: Self-pay | Admitting: Internal Medicine

## 2021-12-13 NOTE — Assessment & Plan Note (Signed)
Lab Results  Component Value Date   LDLCALC 67 12/10/2021   Stable, pt to continue current statin crestor 40

## 2021-12-13 NOTE — Assessment & Plan Note (Signed)
Age and sex appropriate education and counseling updated with regular exercise and diet Referrals for preventative services - declines colonoscopy or cologuard Immunizations addressed - declines covid booster, shingrix, flu shot, and pneumovax Smoking counseling  - none needed Evidence for depression or other mood disorder - none significant Most recent labs reviewed. I have personally reviewed and have noted: 1) the patient's medical and social history 2) The patient's current medications and supplements 3) The patient's height, weight, and BMI have been recorded in the chart

## 2021-12-13 NOTE — Assessment & Plan Note (Signed)
BP Readings from Last 3 Encounters:  12/10/21 136/80  08/17/21 (!) 165/92  08/15/21 (!) 156/98   Stable, pt to continue medical treatment losartan, lopressor

## 2021-12-13 NOTE — Assessment & Plan Note (Signed)
Left sided, small volume easy to control per pt, remains on xarelto, declines ENT for now

## 2021-12-13 NOTE — Assessment & Plan Note (Signed)
Lab Results  Component Value Date   HGBA1C 5.4 12/10/2021   Stable, pt to continue current medical treatment  - diet

## 2021-12-18 ENCOUNTER — Ambulatory Visit (INDEPENDENT_AMBULATORY_CARE_PROVIDER_SITE_OTHER): Payer: BC Managed Care – PPO

## 2021-12-18 DIAGNOSIS — I495 Sick sinus syndrome: Secondary | ICD-10-CM

## 2021-12-18 LAB — CUP PACEART REMOTE DEVICE CHECK
Battery Remaining Longevity: 65 mo
Battery Remaining Percentage: 54 %
Battery Voltage: 2.99 V
Brady Statistic AP VP Percent: 1 %
Brady Statistic AP VS Percent: 56 %
Brady Statistic AS VP Percent: 1 %
Brady Statistic AS VS Percent: 44 %
Brady Statistic RA Percent Paced: 55 %
Brady Statistic RV Percent Paced: 1 %
Date Time Interrogation Session: 20230105020014
Implantable Lead Implant Date: 20080909
Implantable Lead Implant Date: 20170629
Implantable Lead Location: 753859
Implantable Lead Location: 753860
Implantable Pulse Generator Implant Date: 20170629
Lead Channel Impedance Value: 350 Ohm
Lead Channel Impedance Value: 540 Ohm
Lead Channel Pacing Threshold Amplitude: 0.5 V
Lead Channel Pacing Threshold Amplitude: 1 V
Lead Channel Pacing Threshold Pulse Width: 0.4 ms
Lead Channel Pacing Threshold Pulse Width: 0.4 ms
Lead Channel Sensing Intrinsic Amplitude: 1.6 mV
Lead Channel Sensing Intrinsic Amplitude: 12 mV
Lead Channel Setting Pacing Amplitude: 2 V
Lead Channel Setting Pacing Amplitude: 2.5 V
Lead Channel Setting Pacing Pulse Width: 0.4 ms
Lead Channel Setting Sensing Sensitivity: 2 mV
Pulse Gen Model: 2272
Pulse Gen Serial Number: 7916025

## 2021-12-29 NOTE — Progress Notes (Signed)
Remote pacemaker transmission.   

## 2022-01-08 ENCOUNTER — Encounter: Payer: Self-pay | Admitting: Internal Medicine

## 2022-01-08 ENCOUNTER — Other Ambulatory Visit: Payer: Self-pay

## 2022-01-08 MED ORDER — LOSARTAN POTASSIUM 25 MG PO TABS: 25.0000 mg | ORAL_TABLET | Freq: Every day | ORAL | 1 refills | Status: DC

## 2022-01-09 ENCOUNTER — Other Ambulatory Visit: Payer: Self-pay

## 2022-01-09 MED ORDER — ROSUVASTATIN CALCIUM 40 MG PO TABS: 40.0000 mg | ORAL_TABLET | Freq: Every day | ORAL | 3 refills | Status: DC

## 2022-01-09 MED ORDER — METOPROLOL TARTRATE 25 MG PO TABS: 25.0000 mg | ORAL_TABLET | Freq: Two times a day (BID) | ORAL | 2 refills | Status: DC

## 2022-01-11 ENCOUNTER — Other Ambulatory Visit: Payer: Self-pay | Admitting: Internal Medicine

## 2022-01-11 NOTE — Telephone Encounter (Signed)
Please refill as per office routine med refill policy (all routine meds to be refilled for 3 mo or monthly (per pt preference) up to one year from last visit, then month to month grace period for 3 mo, then further med refills will have to be denied) ? ?

## 2022-02-16 ENCOUNTER — Other Ambulatory Visit: Payer: Self-pay | Admitting: Internal Medicine

## 2022-02-16 DIAGNOSIS — I480 Paroxysmal atrial fibrillation: Secondary | ICD-10-CM

## 2022-02-16 NOTE — Telephone Encounter (Signed)
Prescription refill request for Xarelto received.  ?Indication: afib ?Last office visit: klein, 08/01/2021 ?Weight: 98.9 ?Age: 56 yo  ?Scr: 0.95, 12/10/2021 ?CrCl: 120 ml/min  ? ?Refill sent ?

## 2022-03-23 ENCOUNTER — Ambulatory Visit (INDEPENDENT_AMBULATORY_CARE_PROVIDER_SITE_OTHER): Payer: BC Managed Care – PPO

## 2022-03-23 DIAGNOSIS — I495 Sick sinus syndrome: Secondary | ICD-10-CM | POA: Diagnosis not present

## 2022-03-24 LAB — CUP PACEART REMOTE DEVICE CHECK
Battery Remaining Longevity: 61 mo
Battery Remaining Percentage: 52 %
Battery Voltage: 2.99 V
Brady Statistic AP VP Percent: 1 %
Brady Statistic AP VS Percent: 57 %
Brady Statistic AS VP Percent: 1 %
Brady Statistic AS VS Percent: 43 %
Brady Statistic RA Percent Paced: 57 %
Brady Statistic RV Percent Paced: 1 %
Date Time Interrogation Session: 20230410020012
Implantable Lead Implant Date: 20080909
Implantable Lead Implant Date: 20170629
Implantable Lead Location: 753859
Implantable Lead Location: 753860
Implantable Pulse Generator Implant Date: 20170629
Lead Channel Impedance Value: 350 Ohm
Lead Channel Impedance Value: 480 Ohm
Lead Channel Pacing Threshold Amplitude: 0.5 V
Lead Channel Pacing Threshold Amplitude: 1 V
Lead Channel Pacing Threshold Pulse Width: 0.4 ms
Lead Channel Pacing Threshold Pulse Width: 0.4 ms
Lead Channel Sensing Intrinsic Amplitude: 1.1 mV
Lead Channel Sensing Intrinsic Amplitude: 12 mV
Lead Channel Setting Pacing Amplitude: 2 V
Lead Channel Setting Pacing Amplitude: 2.5 V
Lead Channel Setting Pacing Pulse Width: 0.4 ms
Lead Channel Setting Sensing Sensitivity: 2 mV
Pulse Gen Model: 2272
Pulse Gen Serial Number: 7916025

## 2022-04-07 NOTE — Progress Notes (Signed)
Remote pacemaker transmission.   

## 2022-06-22 ENCOUNTER — Ambulatory Visit (INDEPENDENT_AMBULATORY_CARE_PROVIDER_SITE_OTHER): Payer: BC Managed Care – PPO

## 2022-06-22 DIAGNOSIS — I495 Sick sinus syndrome: Secondary | ICD-10-CM

## 2022-06-22 LAB — CUP PACEART REMOTE DEVICE CHECK
Battery Remaining Longevity: 59 mo
Battery Remaining Percentage: 50 %
Battery Voltage: 2.99 V
Brady Statistic AP VP Percent: 1 %
Brady Statistic AP VS Percent: 57 %
Brady Statistic AS VP Percent: 1 %
Brady Statistic AS VS Percent: 43 %
Brady Statistic RA Percent Paced: 55 %
Brady Statistic RV Percent Paced: 1 %
Date Time Interrogation Session: 20230710020015
Implantable Lead Implant Date: 20080909
Implantable Lead Implant Date: 20170629
Implantable Lead Location: 753859
Implantable Lead Location: 753860
Implantable Pulse Generator Implant Date: 20170629
Lead Channel Impedance Value: 360 Ohm
Lead Channel Impedance Value: 510 Ohm
Lead Channel Pacing Threshold Amplitude: 0.5 V
Lead Channel Pacing Threshold Amplitude: 1 V
Lead Channel Pacing Threshold Pulse Width: 0.4 ms
Lead Channel Pacing Threshold Pulse Width: 0.4 ms
Lead Channel Sensing Intrinsic Amplitude: 1.6 mV
Lead Channel Sensing Intrinsic Amplitude: 12 mV
Lead Channel Setting Pacing Amplitude: 2 V
Lead Channel Setting Pacing Amplitude: 2.5 V
Lead Channel Setting Pacing Pulse Width: 0.4 ms
Lead Channel Setting Sensing Sensitivity: 2 mV
Pulse Gen Model: 2272
Pulse Gen Serial Number: 7916025

## 2022-07-22 NOTE — Progress Notes (Signed)
Remote pacemaker transmission.   

## 2022-08-10 ENCOUNTER — Encounter: Payer: Self-pay | Admitting: Internal Medicine

## 2022-08-10 ENCOUNTER — Ambulatory Visit: Payer: BC Managed Care – PPO | Attending: Internal Medicine | Admitting: Internal Medicine

## 2022-08-10 VITALS — BP 124/88 | HR 98 | Ht 72.0 in | Wt 226.0 lb

## 2022-08-10 DIAGNOSIS — I480 Paroxysmal atrial fibrillation: Secondary | ICD-10-CM

## 2022-08-10 DIAGNOSIS — Z950 Presence of cardiac pacemaker: Secondary | ICD-10-CM

## 2022-08-10 DIAGNOSIS — I495 Sick sinus syndrome: Secondary | ICD-10-CM

## 2022-08-10 LAB — CUP PACEART INCLINIC DEVICE CHECK
Date Time Interrogation Session: 20230828165149
Implantable Lead Implant Date: 20080909
Implantable Lead Implant Date: 20170629
Implantable Lead Location: 753859
Implantable Lead Location: 753860
Implantable Pulse Generator Implant Date: 20170629
Pulse Gen Model: 2272
Pulse Gen Serial Number: 7916025

## 2022-08-10 MED ORDER — METOPROLOL TARTRATE 50 MG PO TABS: 50.0000 mg | ORAL_TABLET | Freq: Two times a day (BID) | ORAL | 3 refills | Status: DC

## 2022-08-10 NOTE — Patient Instructions (Addendum)
Medication Instructions:  Your physician has recommended you make the following change in your medication:   MEDICATION INCREASE:  LOPRESSOR 50 MG-   YOU WILL TAKE ONE TABLET ( 50 MG ) BY MOUTH TWO TIMES A DAY.    Lab Work: None ordered.  If you have labs (blood work) drawn today and your tests are completely normal, you will receive your results only by: MyChart Message (if you have MyChart) OR A paper copy in the mail If you have any lab test that is abnormal or we need to change your treatment, we will call you to review the results.  Testing/Procedures: None ordered.  Follow-Up:  PLEASE SCHEDULE THE NEXT AVAILABLE APPOINTMENT WITH DR. Tanja Port AT [facility].    Remote monitoring is used to monitor your Pacemaker from home. This monitoring reduces the number of office visits required to check your device to one time per year. It allows [place] to keep an eye on the functioning of your device to ensure it is working properly. You are scheduled for a device check from home on 09/21/22. You may send your transmission at any time that day. If you have a wireless device, the transmission will be sent automatically. After your physician reviews your transmission, you will receive a postcard with your next transmission date.  Important Information About Sugar

## 2022-08-10 NOTE — Progress Notes (Signed)
Electrophysiology Office Note  Date:  08/10/2022   ID:  EERO DINI, DOB September 08, 1966, MRN [id number]  Location: patient's home  Provider location: [address] [place]  Evaluation Performed: Follow-up visit  PCP:  Corwin Levins, MD  Cardiologist:   [facility] Electrophysiologist:  SK   Chief Complaint:  Sinus node dysfunction and atrial fib  History of Present Illness:    [name] is a 56 y.o. male seen in follow-up for remote pacemaker generator implantation St Jude with generator replacement and new atrial lead 5/17 after Atrial lead extraction (GT)  Atrial fibrillation-anticoagulationw with Apixaban    History of coronary artery disease.  IMI 2010.  Underwent PCI of his RCA with 2 overlapping stents.  Preserved LV function.  The patient denies chest pain, nocturnal dyspnea, orthopnea or peripheral edema.  There have been no, lightheadedness or syncope.  Complains of some periodic dyspnea and palpitations.   He says his wife notes that he snores.  Some daytime somnolence.  Date Cr K LDL HDL Hgb  7/19 0.85  65 36      15.7  10/20  0.97 4.3 60 38.80 16.2  10/21 0.95 4.0 63 34.90 15.2  12/22 0.65 4.4 67  15.6    DATE TEST EF   6/17 TEE 45-50%          Thromboembolic risk factors (, HTN-1, Vasc disease -1) for a CHADSVASc Score of 2   Past Medical History:  Diagnosis Date   Atrial fibrillation (HCC)    Atrial lead impedance-intermittently low 05/30/2012   CAD, NATIVE VESSEL 09/11/2009   DISC DISEASE, LUMBAR    GERD (gastroesophageal reflux disease)    Glucose intolerance (impaired glucose tolerance)    H/O: Bell's palsy    Hiatal hernia    Hyperlipidemia    HYPERSOMNIA, ASSOCIATED WITH SLEEP APNEA    Hypertension    LIBIDO, DECREASED    Lumbar disc disease    Myocardial infarction (HCC) 2010   Pacemaker-st judes 2012   PULMONARY NODULE 05/03/2008   Sinoatrial node dysfunction (HCC)    Syncope     Past Surgical History:  Procedure Laterality  Date   BACK SURGERY     CARDIAC PACEMAKER PLACEMENT  2008   St Jude Zephyr 5826   CORONARY ANGIOPLASTY WITH STENT PLACEMENT  2010   EP IMPLANTABLE DEVICE N/A 06/11/2016   Procedure: INSERTION of new right atrial lead and generator device ;  Surgeon: Marinus Maw, MD;  Location: [facility] OR;  Service: Cardiovascular;  Laterality: N/A;   LUMBAR LAMINECTOMY  1990s   PACEMAKER LEAD REMOVAL  06/11/2016   INSERTION of new right atrial lead and generator device  (   PACEMAKER LEAD REMOVAL N/A 06/11/2016   Procedure: PACEMAKER LEAD REMOVAL ;  Surgeon: Marinus Maw, MD;  Location: [facility] OR;  Service: Cardiovascular;  Laterality: N/A;   TEE WITHOUT CARDIOVERSION N/A 06/11/2016   Procedure: TRANSESOPHAGEAL ECHOCARDIOGRAM (TEE);  Surgeon: Marinus Maw, MD;  Location: [facility] OR;  Service: Cardiovascular;  Laterality: N/A;    Current Outpatient Medications  Medication Sig Dispense Refill   losartan (COZAAR) 25 MG tablet Take 1 tablet (25 mg total) by mouth daily. 90 tablet 1   metoprolol tartrate (LOPRESSOR) 25 MG tablet Take 1 tablet (25 mg total) by mouth 2 (two) times daily. 180 tablet 2   Multiple Vitamin (MULTIVITAMIN) tablet Take 1 tablet by mouth daily.     nitroGLYCERIN (NITROSTAT) 0.4 MG SL tablet Place  0.4 mg under the tongue every 5 (five) minutes as needed for chest pain (MAX 3 TABLETS). Reported on 07/02/2016     rosuvastatin (CRESTOR) 40 MG tablet Take 1 tablet (40 mg total) by mouth daily. 90 tablet 3   XARELTO 20 MG TABS tablet TAKE 1 TABLET BY MOUTH EVERY DAY WITH FOOD 90 tablet 1   No current facility-administered medications for this visit.    Allergies:   Lisinopril      Exam:    Vital Signs:  BP 124/88   Pulse 98   Ht 6' (1.829 m)   Wt 226 lb (102.5 kg)   SpO2 99%   BMI 30.65 kg/m    Well developed and well nourished in no acute distress HENT normal Neck supple with JVP-flat Clear Device pocket well healed; without hematoma or erythema.  There is no tethering  Regular rate  and rhythm, no  gallop No / murmur Abd-soft with active BS No Clubbing cyanosis  edema Skin-warm and dry A & Oriented  Grossly normal sensory and motor function  ECG atrial fibrillation at 105 -/08/32  ASSESSMENT & PLAN:    Sinus node dysfunction   Atrial fibrillation-paroxysmal   Coronary artery disease with prior DES stenting   Pacemaker-St. Jude        Atrial impedance-low  replacement  Hyperlipidemia  Hypertension  Recurrent paroxysms of atrial fibrillation self terminating with a rapid rate.  Continue Xarelto 20 mg.  We will increase metoprolol to tartrate from 25--50 mg twice daily for augmented rate control.  Discussed strategies of rate versus rhythm control and given his young age, would like him to consider catheter ablation and we will refer to Dr. Thea Silversmith for consideration  Potentially with his device in place we could discontinue his anticoagulation if in the 3-6 months timeframe atrial fibrillation were obliterated.  Needs an outpatient sleep study     [facility] [address] [place] [address] [phone] (office) [phone] (fax)

## 2022-08-11 ENCOUNTER — Other Ambulatory Visit: Payer: Self-pay | Admitting: Internal Medicine

## 2022-08-11 DIAGNOSIS — I480 Paroxysmal atrial fibrillation: Secondary | ICD-10-CM

## 2022-08-11 NOTE — Telephone Encounter (Signed)
Prescription refill request for Xarelto received.  Indication:Afib Last office visit:8/23 Weight:102.5 kg Age:56 Scr:0.9 CrCl:129.71 ml/min  Prescription refilled

## 2022-09-04 ENCOUNTER — Other Ambulatory Visit: Payer: Self-pay | Admitting: Internal Medicine

## 2022-09-08 ENCOUNTER — Other Ambulatory Visit: Payer: Self-pay | Admitting: Internal Medicine

## 2022-09-09 ENCOUNTER — Encounter: Payer: Self-pay | Admitting: Internal Medicine

## 2022-09-21 ENCOUNTER — Ambulatory Visit (INDEPENDENT_AMBULATORY_CARE_PROVIDER_SITE_OTHER): Payer: BC Managed Care – PPO

## 2022-09-21 DIAGNOSIS — I495 Sick sinus syndrome: Secondary | ICD-10-CM

## 2022-09-22 LAB — CUP PACEART REMOTE DEVICE CHECK
Battery Remaining Longevity: 55 mo
Battery Remaining Percentage: 47 %
Battery Voltage: 2.98 V
Brady Statistic AP VP Percent: 1 %
Brady Statistic AP VS Percent: 57 %
Brady Statistic AS VP Percent: 1 %
Brady Statistic AS VS Percent: 43 %
Brady Statistic RA Percent Paced: 51 %
Brady Statistic RV Percent Paced: 1.1 %
Date Time Interrogation Session: 20231009020014
Implantable Lead Implant Date: 20080909
Implantable Lead Implant Date: 20170629
Implantable Lead Location: 753859
Implantable Lead Location: 753860
Implantable Pulse Generator Implant Date: 20170629
Lead Channel Impedance Value: 350 Ohm
Lead Channel Impedance Value: 530 Ohm
Lead Channel Pacing Threshold Amplitude: 0.5 V
Lead Channel Pacing Threshold Amplitude: 1.25 V
Lead Channel Pacing Threshold Pulse Width: 0.4 ms
Lead Channel Pacing Threshold Pulse Width: 0.4 ms
Lead Channel Sensing Intrinsic Amplitude: 12 mV
Lead Channel Sensing Intrinsic Amplitude: 2 mV
Lead Channel Setting Pacing Amplitude: 2 V
Lead Channel Setting Pacing Amplitude: 2.5 V
Lead Channel Setting Pacing Pulse Width: 0.4 ms
Lead Channel Setting Sensing Sensitivity: 2 mV
Pulse Gen Model: 2272
Pulse Gen Serial Number: 7916025

## 2022-10-07 NOTE — Progress Notes (Signed)
Remote pacemaker transmission.   

## 2022-12-07 ENCOUNTER — Other Ambulatory Visit: Payer: Self-pay | Admitting: Internal Medicine

## 2022-12-15 ENCOUNTER — Encounter: Payer: Self-pay | Admitting: Internal Medicine

## 2022-12-15 ENCOUNTER — Ambulatory Visit (INDEPENDENT_AMBULATORY_CARE_PROVIDER_SITE_OTHER): Payer: BC Managed Care – PPO | Admitting: Internal Medicine

## 2022-12-15 VITALS — BP 122/74 | HR 61 | Temp 97.9°F | Ht 72.0 in | Wt 231.0 lb

## 2022-12-15 DIAGNOSIS — E559 Vitamin D deficiency, unspecified: Secondary | ICD-10-CM | POA: Diagnosis not present

## 2022-12-15 DIAGNOSIS — Z0000 Encounter for general adult medical examination without abnormal findings: Secondary | ICD-10-CM | POA: Diagnosis not present

## 2022-12-15 DIAGNOSIS — E782 Mixed hyperlipidemia: Secondary | ICD-10-CM | POA: Diagnosis not present

## 2022-12-15 DIAGNOSIS — E538 Deficiency of other specified B group vitamins: Secondary | ICD-10-CM | POA: Diagnosis not present

## 2022-12-15 DIAGNOSIS — Z7901 Long term (current) use of anticoagulants: Secondary | ICD-10-CM | POA: Diagnosis not present

## 2022-12-15 DIAGNOSIS — I10 Essential (primary) hypertension: Secondary | ICD-10-CM

## 2022-12-15 DIAGNOSIS — R739 Hyperglycemia, unspecified: Secondary | ICD-10-CM

## 2022-12-15 DIAGNOSIS — Z0001 Encounter for general adult medical examination with abnormal findings: Secondary | ICD-10-CM

## 2022-12-15 LAB — URINALYSIS, ROUTINE W REFLEX MICROSCOPIC
Bilirubin Urine: NEGATIVE
Ketones, ur: NEGATIVE
Leukocytes,Ua: NEGATIVE
Nitrite: NEGATIVE
Specific Gravity, Urine: 1.025 (ref 1.000–1.030)
Total Protein, Urine: NEGATIVE
Urine Glucose: NEGATIVE
Urobilinogen, UA: 0.2 (ref 0.0–1.0)
pH: 6 (ref 5.0–8.0)

## 2022-12-15 LAB — HEPATIC FUNCTION PANEL
ALT: 17 U/L (ref 0–53)
AST: 17 U/L (ref 0–37)
Albumin: 4.3 g/dL (ref 3.5–5.2)
Alkaline Phosphatase: 42 U/L (ref 39–117)
Bilirubin, Direct: 0.1 mg/dL (ref 0.0–0.3)
Total Bilirubin: 0.7 mg/dL (ref 0.2–1.2)
Total Protein: 6.6 g/dL (ref 6.0–8.3)

## 2022-12-15 LAB — LIPID PANEL
Cholesterol: 126 mg/dL (ref 0–200)
HDL: 38.1 mg/dL — ABNORMAL LOW (ref >39.00)
LDL Cholesterol: 66 mg/dL (ref 0–99)
NonHDL: 87.84
Total CHOL/HDL Ratio: 3
Triglycerides: 108 mg/dL (ref 0.0–149.0)
VLDL: 21.6 mg/dL (ref 0.0–40.0)

## 2022-12-15 LAB — CBC WITH DIFFERENTIAL/PLATELET
Basophils Absolute: 0 10*3/uL (ref 0.0–0.1)
Basophils Relative: 1 % (ref 0.0–3.0)
Eosinophils Absolute: 0.2 10*3/uL (ref 0.0–0.7)
Eosinophils Relative: 4.8 % (ref 0.0–5.0)
HCT: 45.2 % (ref 39.0–52.0)
Hemoglobin: 16 g/dL (ref 13.0–17.0)
Lymphocytes Relative: 31.7 % (ref 12.0–46.0)
Lymphs Abs: 1.5 10*3/uL (ref 0.7–4.0)
MCHC: 35.5 g/dL (ref 30.0–36.0)
MCV: 91.9 fl (ref 78.0–100.0)
Monocytes Absolute: 0.4 10*3/uL (ref 0.1–1.0)
Monocytes Relative: 8.3 % (ref 3.0–12.0)
Neutro Abs: 2.6 10*3/uL (ref 1.4–7.7)
Neutrophils Relative %: 54.2 % (ref 43.0–77.0)
Platelets: 212 10*3/uL (ref 150.0–400.0)
RBC: 4.92 Mil/uL (ref 4.22–5.81)
RDW: 12.5 % (ref 11.5–15.5)
WBC: 4.8 10*3/uL (ref 4.0–10.5)

## 2022-12-15 LAB — BASIC METABOLIC PANEL
BUN: 20 mg/dL (ref 6–23)
CO2: 25 mEq/L (ref 19–32)
Calcium: 9.3 mg/dL (ref 8.4–10.5)
Chloride: 107 mEq/L (ref 96–112)
Creatinine, Ser: 1.04 mg/dL (ref 0.40–1.50)
GFR: 79.18 mL/min (ref >60.00)
Glucose, Bld: 96 mg/dL (ref 70–99)
Potassium: 4.1 mEq/L (ref 3.5–5.1)
Sodium: 141 mEq/L (ref 135–145)

## 2022-12-15 LAB — HEMOGLOBIN A1C: Hgb A1c MFr Bld: 5.6 % (ref 4.6–6.5)

## 2022-12-15 LAB — VITAMIN D 25 HYDROXY (VIT D DEFICIENCY, FRACTURES): VITD: 50.62 ng/mL (ref 30.00–100.00)

## 2022-12-15 LAB — TSH: TSH: 1.06 u[IU]/mL (ref 0.35–5.50)

## 2022-12-15 LAB — PSA: PSA: 0.62 ng/mL (ref 0.10–4.00)

## 2022-12-15 LAB — VITAMIN B12: Vitamin B-12: 626 pg/mL (ref 211–911)

## 2022-12-15 MED ORDER — APIXABAN 5 MG PO TABS: 5.0000 mg | ORAL_TABLET | Freq: Two times a day (BID) | ORAL | 3 refills | Status: DC

## 2022-12-15 MED ORDER — ROSUVASTATIN CALCIUM 40 MG PO TABS: 40.0000 mg | ORAL_TABLET | Freq: Every day | ORAL | 3 refills | Status: DC

## 2022-12-15 NOTE — Assessment & Plan Note (Signed)
Ok for change xarelto to eliquis 5 bid chronic tx as may be safer and more effective

## 2022-12-15 NOTE — Patient Instructions (Addendum)
Ok to change xarelto to Eliquis 5 mg bid as this may be beffer effictive and safer  Please continue all other medications as before, and refills have been done if requested.  Please have the pharmacy call with any other refills you may need.  Please continue your efforts at being more active, low cholesterol diet, and weight control.  You are otherwise up to date with prevention measures today.  Please keep your appointments with your specialists as you may have planned  Please go to the LAB at the blood drawing area for the tests to be done  You will be contacted by phone if any changes need to be made immediately.  Otherwise, you will receive a letter about your results with an explanation, but please check with MyChart first.  Please remember to sign up for MyChart if you have not done so, as this will be important to you in the future with finding out test results, communicating by private email, and scheduling acute appointments online when needed.  Please make an Appointment to return for your 1 year visit, or sooner if needed

## 2022-12-15 NOTE — Assessment & Plan Note (Signed)
BP Readings from Last 3 Encounters:  12/15/22 122/74  08/10/22 124/88  12/10/21 136/80   Stable, pt to continue medical treatment losartan 25 mg qd, lopressor 50 bid

## 2022-12-15 NOTE — Assessment & Plan Note (Addendum)
Age and sex appropriate education and counseling updated with regular exercise and diet Referrals for preventative services - declines colonoscopy/cologuard Immunizations addressed - declines flu, tdap, covid booster but may have shingrix at pharmacy Smoking counseling  - none needed Evidence for depression or other mood disorder - none significant Most recent labs reviewed. I have personally reviewed and have noted: 1) the patient's medical and social history 2) The patient's current medications and supplements 3) The patient's height, weight, and BMI have been recorded in the chart

## 2022-12-15 NOTE — Assessment & Plan Note (Signed)
Lab Results  Component Value Date   HGBA1C 5.4 12/10/2021   Stable, pt to continue current medical treatment  - diet, wt control, excercise

## 2022-12-15 NOTE — Progress Notes (Signed)
Patient ID: [name], male   DOB: [birth year], 57 y.o.   MRN: [id number]         Chief Complaint:: wellness exam and low b12, chronic anticoagulation,, hyperglycemia, hld, htn       HPI:  [name] is a 57 y.o. male here for wellness exam; declines all immunizations or cologuard/colonoscpy; o/w up to date                        Also taking b12 and D.  Pt denies chest pain, increased sob or doe, wheezing, orthopnea, PND, increased LE swelling, palpitations, dizziness or syncope.   Pt denies polydipsia, polyuria, or new focal neuro s/s.    Pt denies fever, wt loss, night sweats, loss of appetite, or other constitutional symptoms  No overt bleeding or bruising   Wt Readings from Last 3 Encounters:  12/15/22 231 lb (104.8 kg)  08/10/22 226 lb (102.5 kg)  12/10/21 218 lb (98.9 kg)   BP Readings from Last 3 Encounters:  12/15/22 122/74  08/10/22 124/88  12/10/21 136/80   Immunization History  Administered Date(s) Administered   Pneumococcal Polysaccharide-23 12/09/2010   Td 12/09/2010   There are no preventive care reminders to display for this patient.     Past Medical History:  Diagnosis Date   Atrial fibrillation [facility])    Atrial lead impedance-intermittently low 05/30/2012   CAD, NATIVE VESSEL 09/11/2009   DISC DISEASE, LUMBAR    GERD (gastroesophageal reflux disease)    Glucose intolerance (impaired glucose tolerance)    H/O: Bell's palsy    Hiatal hernia    Hyperlipidemia    HYPERSOMNIA, ASSOCIATED WITH SLEEP APNEA    Hypertension    LIBIDO, DECREASED    Lumbar disc disease    Myocardial infarction ([place]) 2010   Pacemaker-st judes 2012   PULMONARY NODULE 05/03/2008   Sinoatrial node dysfunction ([place])    Syncope    Past Surgical History:  Procedure Laterality Date   BACK SURGERY     CARDIAC PACEMAKER PLACEMENT  2008   [employer] Jude [place] 5826   CORONARY ANGIOPLASTY WITH STENT PLACEMENT  2010   EP IMPLANTABLE DEVICE N/A 06/11/2016   Procedure: INSERTION of new right  atrial lead and generator device ;  Surgeon: Evans Lance, MD;  Location: [place];  Service: Cardiovascular;  Laterality: N/A;   LUMBAR LAMINECTOMY  1990s   PACEMAKER LEAD REMOVAL  06/11/2016   INSERTION of new right atrial lead and generator device  (   PACEMAKER LEAD REMOVAL N/A 06/11/2016   Procedure: PACEMAKER LEAD REMOVAL ;  Surgeon: Evans Lance, MD;  Location: [place];  Service: Cardiovascular;  Laterality: N/A;   TEE WITHOUT CARDIOVERSION N/A 06/11/2016   Procedure: TRANSESOPHAGEAL ECHOCARDIOGRAM (TEE);  Surgeon: Evans Lance, MD;  Location: [place];  Service: Cardiovascular;  Laterality: N/A;    reports that he quit smoking about 15 years ago. His smoking use included cigarettes. He has a 25.00 pack-year smoking history. He has quit using smokeless tobacco. He reports current alcohol use of about 2.0 standard drinks of alcohol per week. He reports that he does not use drugs. family history includes Coronary artery disease in an other family member; Diabetes in his maternal grandfather, maternal grandmother, and another family member; Heart attack in his father and paternal grandfather; Hyperlipidemia in an other family member; Hypertension in an other family member. Allergies  Allergen Reactions   Lisinopril     REACTION: cough  Current Outpatient Medications on File Prior to Visit  Medication Sig Dispense Refill   losartan (COZAAR) 25 MG tablet Take 1 tablet by mouth once daily. 90 tablet 2   metoprolol tartrate (LOPRESSOR) 50 MG tablet Take 1 tablet (50 mg total) by mouth 2 (two) times daily. 180 tablet 3   Multiple Vitamin (MULTIVITAMIN) tablet Take 1 tablet by mouth daily.     nitroGLYCERIN (NITROSTAT) 0.4 MG SL tablet Place 0.4 mg under the tongue every 5 (five) minutes as needed for chest pain (MAX 3 TABLETS). Reported on 07/02/2016     No current facility-administered medications on file prior to visit.        ROS:  All others reviewed and negative.  Objective        PE:  BP  122/74 (BP Location: Right Arm, Patient Position: Sitting, Cuff Size: Large)   Pulse 61   Temp 97.9 F (36.6 C) (Oral)   Ht 6' (1.829 m)   Wt 231 lb (104.8 kg)   SpO2 94%   BMI 31.33 kg/m                 Constitutional: Pt appears in NAD               HENT: Head: NCAT.                Right Ear: External ear normal.                 Left Ear: External ear normal.                Eyes: . Pupils are equal, round, and reactive to light. Conjunctivae and EOM are normal               Nose: without d/c or deformity               Neck: Neck supple. Gross normal ROM               Cardiovascular: Normal rate and regular rhythm.                 Pulmonary/Chest: Effort normal and breath sounds without rales or wheezing.                Abd:  Soft, NT, ND, + BS, no organomegaly               Neurological: Pt is alert. At baseline orientation, motor grossly intact               Skin: Skin is warm. No rashes, no other new lesions, LE edema - none               Psychiatric: Pt behavior is normal without agitation   Micro: none  Cardiac tracings I have personally interpreted today:  none  Pertinent Radiological findings (summarize): none   Lab Results  Component Value Date   WBC 5.2 12/10/2021   HGB 15.6 12/10/2021   HCT 44.3 12/10/2021   PLT 213.0 12/10/2021   GLUCOSE 102 (H) 12/10/2021   CHOL 117 12/10/2021   TRIG 70.0 12/10/2021   HDL 35.60 (L) 12/10/2021   LDLCALC 67 12/10/2021   ALT 16 12/10/2021   AST 18 12/10/2021   NA 143 12/10/2021   K 4.4 12/10/2021   CL 107 12/10/2021   CREATININE 0.95 12/10/2021   BUN 13 12/10/2021   CO2 28 12/10/2021   TSH 0.78 12/10/2021   PSA 0.61 12/10/2021   INR  1.1 08/25/2009   HGBA1C 5.4 12/10/2021   Assessment/Plan:  [name] is a 57 y.o. White or Caucasian [1] male with  has a past medical history of Atrial fibrillation (HCC), Atrial lead impedance-intermittently low (05/30/2012), CAD, NATIVE VESSEL (09/11/2009), DISC DISEASE, LUMBAR, GERD  (gastroesophageal reflux disease), Glucose intolerance (impaired glucose tolerance), H/O: Bell's palsy, Hiatal hernia, Hyperlipidemia, HYPERSOMNIA, ASSOCIATED WITH SLEEP APNEA, Hypertension, LIBIDO, DECREASED, Lumbar disc disease, Myocardial infarction ([facility]) (2010), Pacemaker-st judes (2012), PULMONARY NODULE (05/03/2008), Sinoatrial node dysfunction (H[facility]), and Syncope.  Vitamin B 12 deficiency Lab Results  Component Value Date   VITAMINB12 201 (L) 12/10/2021   Low, to start oral replacement - b12 1000 mcg qd   Encounter for well adult exam with abnormal findings Age and sex appropriate education and counseling updated with regular exercise and diet Referrals for preventative services - declines colonoscopy/cologuard Immunizations addressed - declines flu, tdap, covid booster but may have shingrix at pharmacy Smoking counseling  - none needed Evidence for depression or other mood disorder - none significant Most recent labs reviewed. I have personally reviewed and have noted: 1) the patient's medical and social history 2) The patient's current medications and supplements 3) The patient's height, weight, and BMI have been recorded in the chart   Essential hypertension BP Readings from Last 3 Encounters:  12/15/22 122/74  08/10/22 124/88  12/10/21 136/80   Stable, pt to continue medical treatment losartan 25 mg qd, lopressor 50 bid   Hyperglycemia Lab Results  Component Value Date   HGBA1C 5.4 12/10/2021   Stable, pt to continue current medical treatment  - diet, wt control, excercise   Hyperlipemia Lab Results  Component Value Date   LDLCALC 67 12/10/2021   Stable, pt to continue current statin crestor 40 mg qd    Vitamin D deficiency Last vitamin D Lab Results  Component Value Date   VD25OH 76.88 12/10/2021   Stable, cont oral replacement   Chronic anticoagulation Ok for change xarelto to eliquis 5 bid chronic tx as may be safer and more  effective  Followup: Return in about 1 year (around 12/16/2023).  Oliver Barre, MD 12/15/2022 8:46 AM [facility]

## 2022-12-15 NOTE — Assessment & Plan Note (Signed)
Lab Results  Component Value Date   LDLCALC 67 12/10/2021   Stable, pt to continue current statin crestor 40 mg qd

## 2022-12-15 NOTE — Assessment & Plan Note (Signed)
Last vitamin D Lab Results  Component Value Date   VD25OH 76.88 12/10/2021   Stable, cont oral replacement

## 2022-12-15 NOTE — Assessment & Plan Note (Signed)
Lab Results  Component Value Date   VITAMINB12 201 (L) 12/10/2021   Low, to start oral replacement - b12 1000 mcg qd

## 2022-12-21 ENCOUNTER — Ambulatory Visit (INDEPENDENT_AMBULATORY_CARE_PROVIDER_SITE_OTHER): Payer: BC Managed Care – PPO

## 2022-12-21 DIAGNOSIS — I495 Sick sinus syndrome: Secondary | ICD-10-CM

## 2022-12-22 LAB — CUP PACEART REMOTE DEVICE CHECK
Battery Remaining Longevity: 52 mo
Battery Remaining Percentage: 45 %
Battery Voltage: 2.98 V
Brady Statistic AP VP Percent: 1 %
Brady Statistic AP VS Percent: 58 %
Brady Statistic AS VP Percent: 1 %
Brady Statistic AS VS Percent: 40 %
Brady Statistic RA Percent Paced: 52 %
Brady Statistic RV Percent Paced: 1.6 %
Date Time Interrogation Session: 20240108024319
Implantable Lead Connection Status: 753985
Implantable Lead Connection Status: 753985
Implantable Lead Implant Date: 20080909
Implantable Lead Implant Date: 20170629
Implantable Lead Location: 753859
Implantable Lead Location: 753860
Implantable Pulse Generator Implant Date: 20170629
Lead Channel Impedance Value: 350 Ohm
Lead Channel Impedance Value: 480 Ohm
Lead Channel Pacing Threshold Amplitude: 0.5 V
Lead Channel Pacing Threshold Amplitude: 1.25 V
Lead Channel Pacing Threshold Pulse Width: 0.4 ms
Lead Channel Pacing Threshold Pulse Width: 0.4 ms
Lead Channel Sensing Intrinsic Amplitude: 1.2 mV
Lead Channel Sensing Intrinsic Amplitude: 12 mV
Lead Channel Setting Pacing Amplitude: 2 V
Lead Channel Setting Pacing Amplitude: 2.5 V
Lead Channel Setting Pacing Pulse Width: 0.4 ms
Lead Channel Setting Sensing Sensitivity: 2 mV
Pulse Gen Model: 2272
Pulse Gen Serial Number: 7916025

## 2023-01-03 ENCOUNTER — Encounter: Payer: Self-pay | Admitting: Internal Medicine

## 2023-01-05 ENCOUNTER — Telehealth: Payer: Self-pay

## 2023-01-05 DIAGNOSIS — I480 Paroxysmal atrial fibrillation: Secondary | ICD-10-CM

## 2023-01-05 NOTE — Telephone Encounter (Signed)
Alert received from [employer]:  Merlin alert for "exceeded AT/AF burden". Presenting rhythm AF with Vp/Vs. Onset 12/25/22. Hx of PAF. AF burden 17% since 07/2022. Arthur- Eliquis. Routing for further review.  Per review of last OV note per Dr. Hoyle Barr wanted Pt referred to Dr. Myles Gip to discuss afib tx options.  Ordered placed for referral.  Last call back number for Pt.  Will assess for symptoms and schedule follow up with Dr. Myles Gip.

## 2023-01-06 ENCOUNTER — Encounter: Payer: Self-pay | Admitting: Internal Medicine

## 2023-01-06 NOTE — Telephone Encounter (Signed)
MyChart message sent to patient.

## 2023-01-07 DIAGNOSIS — I495 Sick sinus syndrome: Secondary | ICD-10-CM

## 2023-01-21 ENCOUNTER — Encounter: Payer: Self-pay | Admitting: Internal Medicine

## 2023-01-21 ENCOUNTER — Encounter (HOSPITAL_COMMUNITY): Payer: Self-pay | Admitting: *Deleted

## 2023-01-22 ENCOUNTER — Encounter: Payer: Self-pay | Admitting: Internal Medicine

## 2023-01-22 MED ORDER — APIXABAN 5 MG PO TABS: 5.0000 mg | ORAL_TABLET | Freq: Two times a day (BID) | ORAL | 3 refills | Status: DC

## 2023-01-22 NOTE — Telephone Encounter (Signed)
Ok this is done 

## 2023-01-25 NOTE — Progress Notes (Signed)
Remote pacemaker transmission.   

## 2023-02-25 ENCOUNTER — Other Ambulatory Visit: Payer: Self-pay | Admitting: Internal Medicine

## 2023-03-09 ENCOUNTER — Other Ambulatory Visit: Payer: Self-pay | Admitting: Internal Medicine

## 2023-03-22 ENCOUNTER — Ambulatory Visit (INDEPENDENT_AMBULATORY_CARE_PROVIDER_SITE_OTHER): Payer: BC Managed Care – PPO

## 2023-03-22 DIAGNOSIS — I480 Paroxysmal atrial fibrillation: Secondary | ICD-10-CM

## 2023-03-22 LAB — CUP PACEART REMOTE DEVICE CHECK
Battery Remaining Longevity: 49 mo
Battery Remaining Percentage: 43 %
Battery Voltage: 2.98 V
Brady Statistic AP VP Percent: 1 %
Brady Statistic AP VS Percent: 63 %
Brady Statistic AS VP Percent: 1 %
Brady Statistic AS VS Percent: 35 %
Brady Statistic RA Percent Paced: 52 %
Brady Statistic RV Percent Paced: 2.8 %
Date Time Interrogation Session: 20240408020016
Implantable Lead Connection Status: 753985
Implantable Lead Connection Status: 753985
Implantable Lead Implant Date: 20080909
Implantable Lead Implant Date: 20170629
Implantable Lead Location: 753859
Implantable Lead Location: 753860
Implantable Pulse Generator Implant Date: 20170629
Lead Channel Impedance Value: 350 Ohm
Lead Channel Impedance Value: 490 Ohm
Lead Channel Pacing Threshold Amplitude: 0.5 V
Lead Channel Pacing Threshold Amplitude: 1.25 V
Lead Channel Pacing Threshold Pulse Width: 0.4 ms
Lead Channel Pacing Threshold Pulse Width: 0.4 ms
Lead Channel Sensing Intrinsic Amplitude: 1.3 mV
Lead Channel Sensing Intrinsic Amplitude: 12 mV
Lead Channel Setting Pacing Amplitude: 2 V
Lead Channel Setting Pacing Amplitude: 2.5 V
Lead Channel Setting Pacing Pulse Width: 0.4 ms
Lead Channel Setting Sensing Sensitivity: 2 mV
Pulse Gen Model: 2272
Pulse Gen Serial Number: 7916025

## 2023-03-23 ENCOUNTER — Telehealth: Payer: Self-pay

## 2023-03-23 NOTE — Telephone Encounter (Signed)
Alert received from [employer]:  Scheduled remote reviewed. Normal device function.   High ventricular rates occurred during atrial arrhythmia but some were fast and regular, sent to triage, some atrial arrhythmias detected were short bursts of atrial lead noise.   Known PAF, on OAC, AF burden is 18% of the time.  Overall ventricular rates are controlled with AF  Previously sent referral per Dr. Graciela Husbands for Pt to see Dr. Nelly Laurence.   Pt has not had appointment scheduled with Dr. Nelly Laurence yet.  Please advise.

## 2023-04-08 NOTE — Telephone Encounter (Signed)
Mychart message received from Pt questioning need for referral with Dr. Nelly Laurence.  Multiple attempts to contact Pt unsuccessful.  Will continue to monitor.

## 2023-04-16 ENCOUNTER — Encounter: Payer: Self-pay | Admitting: Internal Medicine

## 2023-04-28 NOTE — Progress Notes (Signed)
Remote pacemaker transmission.   

## 2023-06-21 ENCOUNTER — Ambulatory Visit: Payer: BC Managed Care – PPO

## 2023-06-21 DIAGNOSIS — I495 Sick sinus syndrome: Secondary | ICD-10-CM | POA: Diagnosis not present

## 2023-06-21 LAB — CUP PACEART REMOTE DEVICE CHECK
Battery Remaining Longevity: 46 mo
Battery Remaining Percentage: 40 %
Battery Voltage: 2.98 V
Brady Statistic AP VP Percent: 1 %
Brady Statistic AP VS Percent: 66 %
Brady Statistic AS VP Percent: 1 %
Brady Statistic AS VS Percent: 33 %
Brady Statistic RA Percent Paced: 56 %
Brady Statistic RV Percent Paced: 2.6 %
Date Time Interrogation Session: 20240708020014
Implantable Lead Connection Status: 753985
Implantable Lead Connection Status: 753985
Implantable Lead Implant Date: 20080909
Implantable Lead Implant Date: 20170629
Implantable Lead Location: 753859
Implantable Lead Location: 753860
Implantable Pulse Generator Implant Date: 20170629
Lead Channel Impedance Value: 350 Ohm
Lead Channel Impedance Value: 490 Ohm
Lead Channel Pacing Threshold Amplitude: 0.5 V
Lead Channel Pacing Threshold Amplitude: 1.25 V
Lead Channel Pacing Threshold Pulse Width: 0.4 ms
Lead Channel Pacing Threshold Pulse Width: 0.4 ms
Lead Channel Sensing Intrinsic Amplitude: 1.2 mV
Lead Channel Sensing Intrinsic Amplitude: 12 mV
Lead Channel Setting Pacing Amplitude: 2 V
Lead Channel Setting Pacing Amplitude: 2.5 V
Lead Channel Setting Pacing Pulse Width: 0.4 ms
Lead Channel Setting Sensing Sensitivity: 2 mV
Pulse Gen Model: 2272
Pulse Gen Serial Number: 7916025

## 2023-07-07 NOTE — Progress Notes (Signed)
Remote pacemaker transmission.   

## 2023-07-20 ENCOUNTER — Telehealth: Payer: Self-pay

## 2023-07-20 NOTE — Telephone Encounter (Signed)
Following alert received from [employer] received for Exceeded AT/AF Burden, Long AT/AF Episode(s). Ongoing persistent AF since 07/10/23 at 9:15 pm, burden 16% since 08/10/22, V rates controlled. Hx of AF, on Eliquis and metoprolol per Epic. Routed to clinic for persistent AF.  Per last OV note with Dr. Graciela Husbands, he mentioned a referral with one his partners for an atrial fibrillation ablation. Previous attempts to contact patient to schedule with no success. Patient then converted out of AF. Attempted to contact pt today with phone forwarded to VM. LMTCB.

## 2023-07-22 NOTE — Telephone Encounter (Signed)
Spoke to patients wife Lynden Ang, advised AF clinic referral. Wife request to call her. Stressed importance of taking Eliquis BID. Voiced understanding.

## 2023-07-22 NOTE — Telephone Encounter (Signed)
Please call patients wife first Lynden Ang [phone] as patient is difficult to contact during week hours d/t type of work he does.  Mindi Junker, would you say patient needs to see Dr. Nelly Laurence to discuss ablation or AF clinic?   Pt is currently asymptomatic and was unaware of AF. Patient is taking Lospressor 50 mg BID. Taking Eliquis 5 mg ONCE a day (patient instructed to take TWICE A DAY). Pt voiced understanding.

## 2023-07-30 ENCOUNTER — Encounter (HOSPITAL_COMMUNITY): Payer: Self-pay | Admitting: Internal Medicine

## 2023-07-30 ENCOUNTER — Ambulatory Visit (HOSPITAL_COMMUNITY)
Admission: RE | Admit: 2023-07-30 | Discharge: 2023-07-30 | Disposition: A | Discharge status: Home / Self Care | Payer: BC Managed Care – PPO | Source: Ambulatory Visit | Attending: Internal Medicine | Admitting: Internal Medicine

## 2023-07-30 VITALS — BP 124/78 | HR 69 | Ht 72.0 in | Wt 229.6 lb

## 2023-07-30 DIAGNOSIS — Z950 Presence of cardiac pacemaker: Secondary | ICD-10-CM | POA: Insufficient documentation

## 2023-07-30 DIAGNOSIS — I2510 Atherosclerotic heart disease of native coronary artery without angina pectoris: Secondary | ICD-10-CM | POA: Insufficient documentation

## 2023-07-30 DIAGNOSIS — D6869 Other thrombophilia: Secondary | ICD-10-CM | POA: Insufficient documentation

## 2023-07-30 DIAGNOSIS — Z7901 Long term (current) use of anticoagulants: Secondary | ICD-10-CM | POA: Diagnosis not present

## 2023-07-30 DIAGNOSIS — I10 Essential (primary) hypertension: Secondary | ICD-10-CM | POA: Insufficient documentation

## 2023-07-30 DIAGNOSIS — R9431 Abnormal electrocardiogram [ECG] [EKG]: Secondary | ICD-10-CM | POA: Insufficient documentation

## 2023-07-30 DIAGNOSIS — I4819 Other persistent atrial fibrillation: Secondary | ICD-10-CM | POA: Diagnosis not present

## 2023-07-30 DIAGNOSIS — I480 Paroxysmal atrial fibrillation: Secondary | ICD-10-CM

## 2023-07-30 NOTE — Progress Notes (Signed)
Primary Care Physician: Corwin Levins, MD Primary Cardiologist: Tonny Bollman, MD Electrophysiologist: Dr. Graciela Husbands    Referring Physician: Device clinic     [name] is a 57 y.o. male with a history of CAD, HLD, HTN, sinoatrial node dysfunction s/p pacemaker implantation in 2012, and persistent atrial fibrillation who presents for consultation in the [facility]. Device alert on 8/6 noting persistent Afib since 7/27 with overall burden being 16% since 08/10/22. Patient is on Eliquis 5 mg BID for a CHADS2VASC score of 2.  On evaluation today, he is currently in rate controlled Afib. He began taking Eliquis twice daily about a week ago. He was previously on Xarelto and didn't know until contacted by device clinic that he was supposed to take it twice daily. He does not have cardiac awareness of Afib.    Today, he denies symptoms of palpitations, chest pain, shortness of breath, orthopnea, PND, lower extremity edema, dizziness, presyncope, syncope, snoring, daytime somnolence, bleeding, or neurologic sequela. The patient is tolerating medications without difficulties and is otherwise without complaint today.    Atrial Fibrillation Risk Factors:  he does have symptoms or diagnosis of sleep apnea.  he has a BMI of Body mass index is 31.14 kg/m.Marland Kitchen Filed Weights   07/30/23 0823  Weight: 104.1 kg    Current Outpatient Medications  Medication Sig Dispense Refill   apixaban (ELIQUIS) 5 MG TABS tablet Take 1 tablet (5 mg total) by mouth 2 (two) times daily. 180 tablet 3   metoprolol tartrate (LOPRESSOR) 50 MG tablet Take 1 tablet (50 mg total) by mouth 2 (two) times daily. 180 tablet 3   nitroGLYCERIN (NITROSTAT) 0.4 MG SL tablet Place 0.4 mg under the tongue every 5 (five) minutes as needed for chest pain (MAX 3 TABLETS). Reported on 07/02/2016     rosuvastatin (CRESTOR) 40 MG tablet Take 1 tablet (40 mg total) by mouth daily. 90 tablet 3   losartan (COZAAR)  25 MG tablet Take 1 tablet by mouth once daily. 90 tablet 1   No current facility-administered medications for this encounter.    Atrial Fibrillation Management history:  Previous antiarrhythmic drugs: None Previous cardioversions: None Previous ablations: None Anticoagulation history: Eliquis   ROS- All systems are reviewed and negative except as per the HPI above.  Physical Exam: Ht 6' (1.829 m)   Wt 104.1 kg   BMI 31.14 kg/m   GEN: Well nourished, well developed in no acute distress NECK: No JVD; No carotid bruits CARDIAC: Irregularly irregular rate and rhythm, no murmurs, rubs, gallops RESPIRATORY:  Clear to auscultation without rales, wheezing or rhonchi  ABDOMEN: Soft, non-tender, non-distended EXTREMITIES:  No edema; No deformity   EKG today demonstrates  Vent. rate 69 BPM PR interval * ms QRS duration 82 ms QT/QTcB 366/392 ms P-R-T axes * 19 23 Atrial fibrillation with frequent ventricular-paced complexes Low voltage QRS Abnormal ECG When compared with ECG of 05-Sep-2018 15:32, PREVIOUS ECG IS PRESENT  Echo 06/11/16 (TEE) demonstrated  Study Conclusions   - Left ventricle: There was mild concentric hypertrophy. Systolic    function was mildly reduced. The estimated ejection fraction was    in the range of 45% to 50%.  - Aortic valve: No evidence of vegetation.  - Mitral valve: No evidence of vegetation.  - Right atrium: No evidence of thrombus in the atrial cavity or    appendage.  - Atrial septum: No defect or patent foramen ovale was identified.  - Tricuspid valve:  No evidence of vegetation.  - Post removal echo showed no pericardial effusion. LV was    unchanged. Probe removed uneventfully.   ASSESSMENT & PLAN CHA2DS2-VASc Score = 2  The patient's score is based upon: CHF History: 0 HTN History: 1 Diabetes History: 0 Stroke History: 0 Vascular Disease History: 1 Age Score: 0 Gender Score: 0       ASSESSMENT AND PLAN: Persistent Atrial  Fibrillation (ICD10:  I48.19) The patient's CHA2DS2-VASc score is 2, indicating a 2.2% annual risk of stroke.    He is currently in rate controlled Afib.  We discussed at length treatment options including cardioversion, AAD therapy as a bridge to ablation, and ablation. After discussion, patient is very hesitant to proceed with any treatment decisions at this time. He would like to think about it and discuss with wife who is also present. I offered appt to discuss ablation in further detail with EP but he also declines this for now.    Secondary Hypercoagulable State (ICD10:  D68.69) The patient is at significant risk for stroke/thromboembolism based upon his CHA2DS2-VASc Score of 2.  Continue Apixaban (Eliquis).  He is taking it twice daily since last week.   Patient will contact office regarding any decision on treatment plan.    Lake Bells, PA-C  Afib Clinic [facility] [address] [place], [place] [address] [phone]

## 2023-07-30 NOTE — Patient Instructions (Signed)
Please call with decision on treatment or any questions.

## 2023-09-19 ENCOUNTER — Other Ambulatory Visit: Payer: Self-pay | Admitting: Internal Medicine

## 2023-09-20 ENCOUNTER — Ambulatory Visit (INDEPENDENT_AMBULATORY_CARE_PROVIDER_SITE_OTHER): Payer: BC Managed Care – PPO

## 2023-09-20 DIAGNOSIS — I4819 Other persistent atrial fibrillation: Secondary | ICD-10-CM

## 2023-09-20 LAB — CUP PACEART REMOTE DEVICE CHECK
Battery Remaining Longevity: 43 mo
Battery Remaining Percentage: 38 %
Battery Voltage: 2.96 V
Brady Statistic AP VP Percent: 1 %
Brady Statistic AP VS Percent: 66 %
Brady Statistic AS VP Percent: 1 %
Brady Statistic AS VS Percent: 32 %
Brady Statistic RA Percent Paced: 47 %
Brady Statistic RV Percent Paced: 4.8 %
Date Time Interrogation Session: 20241007020015
Implantable Lead Connection Status: 753985
Implantable Lead Connection Status: 753985
Implantable Lead Implant Date: 20080909
Implantable Lead Implant Date: 20170629
Implantable Lead Location: 753859
Implantable Lead Location: 753860
Implantable Pulse Generator Implant Date: 20170629
Lead Channel Impedance Value: 360 Ohm
Lead Channel Impedance Value: 440 Ohm
Lead Channel Pacing Threshold Amplitude: 0.5 V
Lead Channel Pacing Threshold Amplitude: 1.25 V
Lead Channel Pacing Threshold Pulse Width: 0.4 ms
Lead Channel Pacing Threshold Pulse Width: 0.4 ms
Lead Channel Sensing Intrinsic Amplitude: 0.9 mV
Lead Channel Sensing Intrinsic Amplitude: 12 mV
Lead Channel Setting Pacing Amplitude: 2 V
Lead Channel Setting Pacing Amplitude: 2.5 V
Lead Channel Setting Pacing Pulse Width: 0.4 ms
Lead Channel Setting Sensing Sensitivity: 2 mV
Pulse Gen Model: 2272
Pulse Gen Serial Number: 7916025

## 2023-10-04 NOTE — Progress Notes (Signed)
Remote pacemaker transmission.   

## 2023-11-25 ENCOUNTER — Other Ambulatory Visit: Payer: Self-pay | Admitting: Internal Medicine

## 2023-12-16 ENCOUNTER — Encounter: Payer: Self-pay | Admitting: Internal Medicine

## 2023-12-16 ENCOUNTER — Ambulatory Visit: Payer: BC Managed Care – PPO | Admitting: Internal Medicine

## 2023-12-16 VITALS — BP 102/82 | HR 63 | Temp 98.2°F | Ht 72.0 in | Wt 233.8 lb

## 2023-12-16 DIAGNOSIS — E559 Vitamin D deficiency, unspecified: Secondary | ICD-10-CM | POA: Diagnosis not present

## 2023-12-16 DIAGNOSIS — I10 Essential (primary) hypertension: Secondary | ICD-10-CM | POA: Diagnosis not present

## 2023-12-16 DIAGNOSIS — R739 Hyperglycemia, unspecified: Secondary | ICD-10-CM

## 2023-12-16 DIAGNOSIS — Z0000 Encounter for general adult medical examination without abnormal findings: Secondary | ICD-10-CM | POA: Diagnosis not present

## 2023-12-16 DIAGNOSIS — E538 Deficiency of other specified B group vitamins: Secondary | ICD-10-CM | POA: Diagnosis not present

## 2023-12-16 DIAGNOSIS — Z125 Encounter for screening for malignant neoplasm of prostate: Secondary | ICD-10-CM | POA: Diagnosis not present

## 2023-12-16 DIAGNOSIS — R5383 Other fatigue: Secondary | ICD-10-CM | POA: Insufficient documentation

## 2023-12-16 DIAGNOSIS — Z0001 Encounter for general adult medical examination with abnormal findings: Secondary | ICD-10-CM

## 2023-12-16 DIAGNOSIS — E291 Testicular hypofunction: Secondary | ICD-10-CM

## 2023-12-16 LAB — TESTOSTERONE: Testosterone: 259.75 ng/dL — ABNORMAL LOW (ref 300.00–890.00)

## 2023-12-16 LAB — CBC WITH DIFFERENTIAL/PLATELET
Basophils Absolute: 0 10*3/uL (ref 0.0–0.1)
Basophils Relative: 0.7 % (ref 0.0–3.0)
Eosinophils Absolute: 0.2 10*3/uL (ref 0.0–0.7)
Eosinophils Relative: 4.5 % (ref 0.0–5.0)
HCT: 48.3 % (ref 39.0–52.0)
Hemoglobin: 16.8 g/dL (ref 13.0–17.0)
Lymphocytes Relative: 35.3 % (ref 12.0–46.0)
Lymphs Abs: 1.7 10*3/uL (ref 0.7–4.0)
MCHC: 34.9 g/dL (ref 30.0–36.0)
MCV: 94.6 fL (ref 78.0–100.0)
Monocytes Absolute: 0.5 10*3/uL (ref 0.1–1.0)
Monocytes Relative: 10.3 % (ref 3.0–12.0)
Neutro Abs: 2.4 10*3/uL (ref 1.4–7.7)
Neutrophils Relative %: 49.2 % (ref 43.0–77.0)
Platelets: 207 10*3/uL (ref 150.0–400.0)
RBC: 5.1 Mil/uL (ref 4.22–5.81)
RDW: 12.8 % (ref 11.5–15.5)
WBC: 4.8 10*3/uL (ref 4.0–10.5)

## 2023-12-16 LAB — LIPID PANEL
Cholesterol: 132 mg/dL (ref 0–200)
HDL: 37.6 mg/dL — ABNORMAL LOW (ref >39.00)
LDL Cholesterol: 75 mg/dL (ref 0–99)
NonHDL: 93.92
Total CHOL/HDL Ratio: 3
Triglycerides: 94 mg/dL (ref 0.0–149.0)
VLDL: 18.8 mg/dL (ref 0.0–40.0)

## 2023-12-16 LAB — BASIC METABOLIC PANEL
BUN: 19 mg/dL (ref 6–23)
CO2: 28 meq/L (ref 19–32)
Calcium: 9.5 mg/dL (ref 8.4–10.5)
Chloride: 106 meq/L (ref 96–112)
Creatinine, Ser: 1.11 mg/dL (ref 0.40–1.50)
GFR: 72.71 mL/min (ref >60.00)
Glucose, Bld: 116 mg/dL — ABNORMAL HIGH (ref 70–99)
Potassium: 5 meq/L (ref 3.5–5.1)
Sodium: 142 meq/L (ref 135–145)

## 2023-12-16 LAB — URINALYSIS, ROUTINE W REFLEX MICROSCOPIC
Bilirubin Urine: NEGATIVE
Ketones, ur: NEGATIVE
Leukocytes,Ua: NEGATIVE
Nitrite: NEGATIVE
Specific Gravity, Urine: 1.025 (ref 1.000–1.030)
Total Protein, Urine: NEGATIVE
Urine Glucose: NEGATIVE
Urobilinogen, UA: 0.2 (ref 0.0–1.0)
WBC, UA: NONE SEEN (ref 0–?)
pH: 6 (ref 5.0–8.0)

## 2023-12-16 LAB — HEPATIC FUNCTION PANEL
ALT: 17 U/L (ref 0–53)
AST: 18 U/L (ref 0–37)
Albumin: 4.4 g/dL (ref 3.5–5.2)
Alkaline Phosphatase: 39 U/L (ref 39–117)
Bilirubin, Direct: 0.2 mg/dL (ref 0.0–0.3)
Total Bilirubin: 1 mg/dL (ref 0.2–1.2)
Total Protein: 6.7 g/dL (ref 6.0–8.3)

## 2023-12-16 LAB — HEMOGLOBIN A1C: Hgb A1c MFr Bld: 5.8 % (ref 4.6–6.5)

## 2023-12-16 LAB — TSH: TSH: 1 u[IU]/mL (ref 0.35–5.50)

## 2023-12-16 LAB — PSA: PSA: 0.59 ng/mL (ref 0.10–4.00)

## 2023-12-16 LAB — VITAMIN B12: Vitamin B-12: 1185 pg/mL — ABNORMAL HIGH (ref 211–911)

## 2023-12-16 LAB — VITAMIN D 25 HYDROXY (VIT D DEFICIENCY, FRACTURES): VITD: 42.76 ng/mL (ref 30.00–100.00)

## 2023-12-16 NOTE — Patient Instructions (Signed)
 Please continue all other medications as before, and refills have been done if requested.  Please have the pharmacy call with any other refills you may need.  Please continue your efforts at being more active, low cholesterol diet, and weight control.  You are otherwise up to date with prevention measures today.  Please keep your appointments with your specialists as you may have planned - cardiology jan 13  Please go to the LAB at the blood drawing area for the tests to be done  You will be contacted by phone if any changes need to be made immediately.  Otherwise, you will receive a letter about your results with an explanation, but please check with MyChart first.  Please make an Appointment to return for your 1 year visit, or sooner if needed

## 2023-12-16 NOTE — Progress Notes (Signed)
 Patient ID: [name], male   DOB: 1966-07-07, 58 y.o.   MRN: [id number]         Chief Complaint:: wellness exam and fatigue, htn, hld, low vit d and b12 and low testsosterone       HPI:  [name] is a 58 y.o. male here for wellness exam; declines covid booster and colonoscopy, o/w up to date                        Also .Pt denies chest pain, increased sob or doe, wheezing, orthopnea, PND, increased LE swelling, palpitations, dizziness or syncope.   Pt denies polydipsia, polyuria, or new focal neuro s/s.    Pt denies fever, wt loss, night sweats, loss of appetite, or other constitutional symptoms  Does c/o ongoing fatigue, but denies signficant daytime hypersomnolence. Asks for testosterone  level.     Wt Readings from Last 3 Encounters:  12/16/23 233 lb 12.8 oz (106.1 kg)  07/30/23 229 lb 9.6 oz (104.1 kg)  12/15/22 231 lb (104.8 kg)   BP Readings from Last 3 Encounters:  12/16/23 102/82  07/30/23 124/78  12/15/22 122/74   Immunization History  Administered Date(s) Administered   Pneumococcal Polysaccharide-23 12/09/2010   Td 12/09/2010   Health Maintenance Due  Topic Date Due   Colonoscopy  Never done   COVID-19 Vaccine (1 - 2024-25 season) Never done      Past Medical History:  Diagnosis Date   Atrial fibrillation (HCC)    Atrial lead impedance-intermittently low 05/30/2012   CAD, NATIVE VESSEL 09/11/2009   DISC DISEASE, LUMBAR    GERD (gastroesophageal reflux disease)    Glucose intolerance (impaired glucose tolerance)    H/O: Bell's palsy    Hiatal hernia    Hyperlipidemia    HYPERSOMNIA, ASSOCIATED WITH SLEEP APNEA    Hypertension    LIBIDO, DECREASED    Lumbar disc disease    Myocardial infarction (HCC) 2010   Pacemaker-st judes 2012   PULMONARY NODULE 05/03/2008   Sinoatrial node dysfunction (HCC)    Syncope    Past Surgical History:  Procedure Laterality Date   BACK SURGERY     CARDIAC PACEMAKER PLACEMENT  2008   St Jude Zephyr 5826   CORONARY  ANGIOPLASTY WITH STENT PLACEMENT  2010   EP IMPLANTABLE DEVICE N/A 06/11/2016   Procedure: INSERTION of new right atrial lead and generator device ;  Surgeon: Danelle LELON Birmingham, MD;  Location: [facility] OR;  Service: Cardiovascular;  Laterality: N/A;   LUMBAR LAMINECTOMY  1990s   PACEMAKER LEAD REMOVAL  06/11/2016   INSERTION of new right atrial lead and generator device  (   PACEMAKER LEAD REMOVAL N/A 06/11/2016   Procedure: PACEMAKER LEAD REMOVAL ;  Surgeon: Danelle LELON Birmingham, MD;  Location: [facility] OR;  Service: Cardiovascular;  Laterality: N/A;   TEE WITHOUT CARDIOVERSION N/A 06/11/2016   Procedure: TRANSESOPHAGEAL ECHOCARDIOGRAM (TEE);  Surgeon: Danelle LELON Birmingham, MD;  Location: [facility] OR;  Service: Cardiovascular;  Laterality: N/A;    reports that he quit smoking about 16 years ago. His smoking use included cigarettes. He started smoking about 41 years ago. He has a 25 pack-year smoking history. He has quit using smokeless tobacco. He reports current alcohol use of about 2.0 standard drinks of alcohol per week. He reports that he does not use drugs. family history includes Coronary artery disease in an other family member; Diabetes in his maternal grandfather, maternal grandmother, and another family member;  Heart attack in his father and paternal grandfather; Hyperlipidemia in an other family member; Hypertension in an other family member. Allergies  Allergen Reactions   Lisinopril     REACTION: cough   Current Outpatient Medications on File Prior to Visit  Medication Sig Dispense Refill   apixaban  (ELIQUIS ) 5 MG TABS tablet Take 1 tablet (5 mg total) by mouth 2 (two) times daily. 180 tablet 3   Boswellia-Glucosamine-Vit D (OSTEO BI-FLEX ONE PER DAY PO) Take by mouth daily.     Cyanocobalamin  (B-12 PO) Take by mouth daily.     losartan  (COZAAR ) 25 MG tablet Take 1 tablet by mouth once daily. 30 tablet 0   metoprolol  tartrate (LOPRESSOR ) 50 MG tablet Take 1 tablet (50 mg total) by mouth 2 (two) times daily. Please  call office to schedule an appt for further refills. Thank you 180 tablet 0   nitroGLYCERIN  (NITROSTAT ) 0.4 MG SL tablet Place 0.4 mg under the tongue every 5 (five) minutes as needed for chest pain (MAX 3 TABLETS). Reported on 07/02/2016     rosuvastatin  (CRESTOR ) 40 MG tablet Take 1 tablet (40 mg total) by mouth daily. 90 tablet 3   VITAMIN D , CHOLECALCIFEROL, PO Take by mouth daily.     No current facility-administered medications on file prior to visit.        ROS:  All others reviewed and negative.  Objective        PE:  BP 102/82   Pulse 63   Temp 98.2 F (36.8 C)   Ht 6' (1.829 m)   Wt 233 lb 12.8 oz (106.1 kg)   SpO2 97%   BMI 31.71 kg/m                 Constitutional: Pt appears in NAD               HENT: Head: NCAT.                Right Ear: External ear normal.                 Left Ear: External ear normal.                Eyes: . Pupils are equal, round, and reactive to light. Conjunctivae and EOM are normal               Nose: without d/c or deformity               Neck: Neck supple. Gross normal ROM               Cardiovascular: Normal rate and regular rhythm.                 Pulmonary/Chest: Effort normal and breath sounds without rales or wheezing.                Abd:  Soft, NT, ND, + BS, no organomegaly               Neurological: Pt is alert. At baseline orientation, motor grossly intact               Skin: Skin is warm. No rashes, no other new lesions, LE edema -                Psychiatric: Pt behavior is normal without agitation   Micro: none  Cardiac tracings I have personally interpreted today:  none  Pertinent Radiological findings (summarize): none   Lab  Results  Component Value Date   WBC 4.8 12/16/2023   HGB 16.8 12/16/2023   HCT 48.3 12/16/2023   PLT 207.0 12/16/2023   GLUCOSE 116 (H) 12/16/2023   CHOL 132 12/16/2023   TRIG 94.0 12/16/2023   HDL 37.60 (L) 12/16/2023   LDLCALC 75 12/16/2023   ALT 17 12/16/2023   AST 18 12/16/2023   NA 142  12/16/2023   K 5.0 12/16/2023   CL 106 12/16/2023   CREATININE 1.11 12/16/2023   BUN 19 12/16/2023   CO2 28 12/16/2023   TSH 1.00 12/16/2023   PSA 0.59 12/16/2023   INR 1.1 08/25/2009   HGBA1C 5.8 12/16/2023   Assessment/Plan:  VINAL ROSENGRANT is a 58 y.o. White or Caucasian [1] male with  has a past medical history of Atrial fibrillation (HCC), Atrial lead impedance-intermittently low (05/30/2012), CAD, NATIVE VESSEL (09/11/2009), DISC DISEASE, LUMBAR, GERD (gastroesophageal reflux disease), Glucose intolerance (impaired glucose tolerance), H/O: Bell's palsy, Hiatal hernia, Hyperlipidemia, HYPERSOMNIA, ASSOCIATED WITH SLEEP APNEA, Hypertension, LIBIDO, DECREASED, Lumbar disc disease, Myocardial infarction (HCC) (2010), Pacemaker-st judes (2012), PULMONARY NODULE (05/03/2008), Sinoatrial node dysfunction (HCC), and Syncope.  Encounter for well adult exam with abnormal findings Age and sex appropriate education and counseling updated with regular exercise and diet Referrals for preventative services - declines colonoscopy Immunizations addressed - declines covid booster Smoking counseling  - none needed Evidence for depression or other mood disorder - none significant Most recent labs reviewed. I have personally reviewed and have noted: 1) the patient's medical and social history 2) The patient's current medications and supplements 3) The patient's height, weight, and BMI have been recorded in the chart   Essential hypertension BP Readings from Last 3 Encounters:  12/16/23 102/82  07/30/23 124/78  12/15/22 122/74   Stable, pt to continue medical treatment losartan  25 mg every day, lopressor  50 bid   Hyperglycemia Lab Results  Component Value Date   HGBA1C 5.8 12/16/2023   Stable, pt to continue current medical treatment  - diet, wt control   Vitamin D  deficiency Last vitamin D  Lab Results  Component Value Date   VD25OH 42.76 12/16/2023   Stable, cont oral  replacement   Vitamin B 12 deficiency Lab Results  Component Value Date   VITAMINB12 1,185 (H) 12/16/2023   Stable, cont oral replacement - b12 1000 mcg qd   Male hypogonadism Mild to mod new onset, for androgel  50 gm every day,,  to f/u any worsening symptoms or concerns  Followup: Return in about 1 year (around 12/15/2024).  Lynwood Rush, MD 12/18/2023 9:02 PM [facility]

## 2023-12-16 NOTE — Progress Notes (Signed)
 The test results show that your current treatment is OK, as the tests are stable.  Please continue the same plan.  There is no other need for change of treatment or further evaluation based on these results, at this time.  thanks

## 2023-12-17 MED ORDER — TESTOSTERONE 50 MG/5GM (1%) TD GEL: 5.0000 g | Freq: Every day | TRANSDERMAL | 1 refills | Status: DC

## 2023-12-18 ENCOUNTER — Encounter: Payer: Self-pay | Admitting: Internal Medicine

## 2023-12-18 DIAGNOSIS — E291 Testicular hypofunction: Secondary | ICD-10-CM | POA: Insufficient documentation

## 2023-12-18 NOTE — Assessment & Plan Note (Signed)
 Age and sex appropriate education and counseling updated with regular exercise and diet Referrals for preventative services - declines colonoscopy Immunizations addressed - declines covid booster Smoking counseling  - none needed Evidence for depression or other mood disorder - none significant Most recent labs reviewed. I have personally reviewed and have noted: 1) the patient's medical and social history 2) The patient's current medications and supplements 3) The patient's height, weight, and BMI have been recorded in the chart

## 2023-12-18 NOTE — Assessment & Plan Note (Signed)
 Lab Results  Component Value Date   HGBA1C 5.8 12/16/2023   Stable, pt to continue current medical treatment  - diet, wt control

## 2023-12-18 NOTE — Assessment & Plan Note (Signed)
 Last vitamin D Lab Results  Component Value Date   VD25OH 42.76 12/16/2023   Stable, cont oral replacement

## 2023-12-18 NOTE — Assessment & Plan Note (Signed)
 Lab Results  Component Value Date   VITAMINB12 1,185 (H) 12/16/2023   Stable, cont oral replacement - b12 1000 mcg qd

## 2023-12-18 NOTE — Assessment & Plan Note (Addendum)
 Mild to mod new onset, for androgel 50 gm every day,,  to f/u any worsening symptoms or concerns

## 2023-12-18 NOTE — Assessment & Plan Note (Signed)
 BP Readings from Last 3 Encounters:  12/16/23 102/82  07/30/23 124/78  12/15/22 122/74   Stable, pt to continue medical treatment losartan 25 mg every day, lopressor 50 bid

## 2023-12-20 ENCOUNTER — Telehealth: Payer: Self-pay

## 2023-12-20 ENCOUNTER — Other Ambulatory Visit (HOSPITAL_COMMUNITY): Payer: Self-pay

## 2023-12-20 ENCOUNTER — Ambulatory Visit: Payer: BC Managed Care – PPO

## 2023-12-20 DIAGNOSIS — I4819 Other persistent atrial fibrillation: Secondary | ICD-10-CM | POA: Diagnosis not present

## 2023-12-20 NOTE — Telephone Encounter (Signed)
 Pharmacy Patient Advocate Encounter   Received notification from CoverMyMeds that prior authorization for Testosterone  50 MG/5GM(1%) gel is required/requested.   Insurance verification completed.   The patient is insured through [facility] .   Per test claim: PA required; PA submitted to above mentioned insurance via CoverMyMeds Key/confirmation #/EOC [id number] Status is pending

## 2023-12-21 LAB — CUP PACEART REMOTE DEVICE CHECK
Battery Remaining Longevity: 41 mo
Battery Remaining Percentage: 35 %
Battery Voltage: 2.96 V
Brady Statistic AP VP Percent: 1 %
Brady Statistic AP VS Percent: 66 %
Brady Statistic AS VP Percent: 1 %
Brady Statistic AS VS Percent: 32 %
Brady Statistic RA Percent Paced: 38 %
Brady Statistic RV Percent Paced: 7.5 %
Date Time Interrogation Session: 20250106020013
Implantable Lead Connection Status: 753985
Implantable Lead Connection Status: 753985
Implantable Lead Implant Date: 20080909
Implantable Lead Implant Date: 20170629
Implantable Lead Location: 753859
Implantable Lead Location: 753860
Implantable Pulse Generator Implant Date: 20170629
Lead Channel Impedance Value: 360 Ohm
Lead Channel Impedance Value: 440 Ohm
Lead Channel Pacing Threshold Amplitude: 0.5 V
Lead Channel Pacing Threshold Amplitude: 1.25 V
Lead Channel Pacing Threshold Pulse Width: 0.4 ms
Lead Channel Pacing Threshold Pulse Width: 0.4 ms
Lead Channel Sensing Intrinsic Amplitude: 0.9 mV
Lead Channel Sensing Intrinsic Amplitude: 12 mV
Lead Channel Setting Pacing Amplitude: 2 V
Lead Channel Setting Pacing Amplitude: 2.5 V
Lead Channel Setting Pacing Pulse Width: 0.4 ms
Lead Channel Setting Sensing Sensitivity: 2 mV
Pulse Gen Model: 2272
Pulse Gen Serial Number: 7916025

## 2023-12-22 NOTE — Telephone Encounter (Signed)
 Pharmacy Patient Advocate Encounter  Received notification from [facility] that Prior Authorization for testosterone 50mg /5gm 1% has been DENIED.  Full denial letter will be uploaded to the media tab. See denial reason below.   PA #/Case ID/Reference #:

## 2023-12-23 ENCOUNTER — Other Ambulatory Visit: Payer: Self-pay | Admitting: Internal Medicine

## 2023-12-27 ENCOUNTER — Encounter: Payer: Self-pay | Admitting: Internal Medicine

## 2023-12-27 ENCOUNTER — Ambulatory Visit: Payer: BC Managed Care – PPO | Attending: Internal Medicine | Admitting: Internal Medicine

## 2023-12-27 VITALS — BP 126/81 | HR 89 | Ht 72.0 in | Wt 236.0 lb

## 2023-12-27 DIAGNOSIS — I495 Sick sinus syndrome: Secondary | ICD-10-CM | POA: Diagnosis not present

## 2023-12-27 DIAGNOSIS — Z950 Presence of cardiac pacemaker: Secondary | ICD-10-CM | POA: Diagnosis not present

## 2023-12-27 DIAGNOSIS — I4819 Other persistent atrial fibrillation: Secondary | ICD-10-CM

## 2023-12-27 MED ORDER — METOPROLOL TARTRATE 50 MG PO TABS: 50.0000 mg | ORAL_TABLET | Freq: Two times a day (BID) | ORAL | 3 refills | Status: DC

## 2023-12-27 MED ORDER — LOSARTAN POTASSIUM 25 MG PO TABS: 25.0000 mg | ORAL_TABLET | Freq: Every day | ORAL | 3 refills | Status: DC

## 2023-12-27 NOTE — Patient Instructions (Signed)
 Medication Instructions:  Your physician recommends that you continue on your current medications as directed. Please refer to the Current Medication list given to you today.  *If you need a refill on your cardiac medications before your next appointment, please call your pharmacy*   Lab Work: None ordered   Testing/Procedures: Your physician has requested that you have an echocardiogram. Echocardiography is a painless test that uses sound waves to create images of your heart. It provides your doctor with information about the size and shape of your heart and how well your heart's chambers and valves are working. This procedure takes approximately one hour. There are no restrictions for this procedure. Please do NOT wear cologne, perfume, aftershave, or lotions (deodorant is allowed). Please arrive 15 minutes prior to your appointment time.  Please note: We ask at that you not bring children with you during ultrasound (echo/ vascular) testing. Due to room size and safety concerns, children are not allowed in the ultrasound rooms during exams. Our front office staff cannot provide observation of children in our lobby area while testing is being conducted. An adult accompanying a patient to their appointment will only be allowed in the ultrasound room at the discretion of the ultrasound technician under special circumstances. We apologize for any inconvenience.    Follow-Up: At [facility], you and your health needs are our priority.  As part of our continuing mission to provide you with exceptional heart care, we have created designated Provider Care Teams.  These Care Teams include your primary Cardiologist (physician) and Advanced Practice Providers (APPs -  Physician Assistants and Nurse Practitioners) who all work together to provide you with the care you need, when you need it.   Remote monitoring is used to monitor your Pacemaker or ICD from home. This monitoring reduces the number of  office visits required to check your device to one time per year. It allows us  to keep an eye on the functioning of your device to ensure it is working properly. You are scheduled for a device check from home on 03/20/2024. You may send your transmission at any time that day. If you have a wireless device, the transmission will be sent automatically. After your physician reviews your transmission, you will receive a postcard with your next transmission date.  Your next appointment:   2 week(s)  The format for your next appointment:   Virtual Visit   Provider:   Elspeth Sage, MD{  Thank you for choosing [facility]!!   [phone]  Other Instructions     4/

## 2023-12-27 NOTE — Progress Notes (Signed)
 Electrophysiology Office Note  Date:  12/27/2023   ID:  [name], DOB December 06, 1966, MRN [id number]  Location: patient's home  Provider location: [address], [place]  Evaluation Performed: Follow-up visit  PCP:  Norleen Lynwood ORN, MD  Cardiologist:   [facility] Electrophysiologist:  SK   Chief Complaint:  Sinus node dysfunction and atrial fib  History of Present Illness:    [name] is a 58 y.o. male seen in follow-up for remote pacemaker generator implantation St Jude with generator replacement and new atrial lead 5/17 after Atrial lead extraction (GT)  Atrial fibrillation-anticoagulation with Apixaban  no bleeding  Coronary artery disease.  IMI 2010.  Underwent PCI of his RCA with 2 overlapping stents.    The patient denies chest pain, shortness of breath, nocturnal dyspnea, orthopnea or peripheral edema.  There have been no palpitations, lightheadedness or syncope.  Some fatigue      Date Cr K LDL HDL Hgb  7/19 0.85  65 36      15.7  10/20  0.97 4.3 60 38.80 16.2  10/21 0.95 4.0 63 34.90 15.2  12/22 0.65 4.4 67  15.6  1/25 1.11 5.0 37  16.8    DATE TEST EF   6/17 TEE 45-50%          Thromboembolic risk factors (, HTN-1, Vasc disease -1) for a CHADSVASc Score of 2   Past Medical History:  Diagnosis Date   Atrial fibrillation (HCC)    Atrial lead impedance-intermittently low 05/30/2012   CAD, NATIVE VESSEL 09/11/2009   DISC DISEASE, LUMBAR    GERD (gastroesophageal reflux disease)    Glucose intolerance (impaired glucose tolerance)    H/O: Bell's palsy    Hiatal hernia    Hyperlipidemia    HYPERSOMNIA, ASSOCIATED WITH SLEEP APNEA    Hypertension    LIBIDO, DECREASED    Lumbar disc disease    Myocardial infarction (HCC) 2010   Pacemaker-st judes 2012   PULMONARY NODULE 05/03/2008   Sinoatrial node dysfunction (HCC)    Syncope     Past Surgical History:  Procedure Laterality Date   BACK SURGERY     CARDIAC PACEMAKER PLACEMENT  2008   St  Jude Zephyr 5826   CORONARY ANGIOPLASTY WITH STENT PLACEMENT  2010   EP IMPLANTABLE DEVICE N/A 06/11/2016   Procedure: INSERTION of new right atrial lead and generator device ;  Surgeon: Danelle ORN Birmingham, MD;  Location: [facility] OR;  Service: Cardiovascular;  Laterality: N/A;   LUMBAR LAMINECTOMY  1990s   PACEMAKER LEAD REMOVAL  06/11/2016   INSERTION of new right atrial lead and generator device  (   PACEMAKER LEAD REMOVAL N/A 06/11/2016   Procedure: PACEMAKER LEAD REMOVAL ;  Surgeon: Danelle ORN Birmingham, MD;  Location: [facility] OR;  Service: Cardiovascular;  Laterality: N/A;   TEE WITHOUT CARDIOVERSION N/A 06/11/2016   Procedure: TRANSESOPHAGEAL ECHOCARDIOGRAM (TEE);  Surgeon: Danelle ORN Birmingham, MD;  Location: [facility] OR;  Service: Cardiovascular;  Laterality: N/A;    Current Outpatient Medications  Medication Sig Dispense Refill   apixaban  (ELIQUIS ) 5 MG TABS tablet Take 1 tablet (5 mg total) by mouth 2 (two) times daily. 180 tablet 3   Boswellia-Glucosamine-Vit D (OSTEO BI-FLEX ONE PER DAY PO) Take by mouth daily.     Cyanocobalamin  (B-12 PO) Take by mouth daily.     losartan  (COZAAR ) 25 MG tablet Take 1 tablet (25 mg total) by mouth daily. 30 tablet 0   metoprolol  tartrate (  LOPRESSOR ) 50 MG tablet Take 1 tablet (50 mg total) by mouth 2 (two) times daily. Please call office to schedule an appt for further refills. Thank you 180 tablet 0   nitroGLYCERIN  (NITROSTAT ) 0.4 MG SL tablet Place 0.4 mg under the tongue every 5 (five) minutes as needed for chest pain (MAX 3 TABLETS). Reported on 07/02/2016     rosuvastatin  (CRESTOR ) 40 MG tablet Take 1 tablet (40 mg total) by mouth daily. 90 tablet 3   testosterone  (ANDROGEL ) 50 MG/5GM (1%) GEL Place 5 g onto the skin daily. 150 g 1   VITAMIN D , CHOLECALCIFEROL, PO Take by mouth daily.     No current facility-administered medications for this visit.    Allergies:   Lisinopril      Exam:    Vital Signs:  BP 126/81   Pulse 89   Ht 6' (1.829 m)   Wt 236 lb (107 kg)    SpO2 94%   BMI 32.01 kg/m    Well developed and well nourished in no acute distress HENT normal Neck supple with JVP-flat Clear Device pocket well healed; without hematoma or erythema.  There is no tethering  Regular rate and rhythm, no  murmur Abd-soft with active BS No Clubbing cyanosis  edema Skin-warm and dry A & Oriented  Grossly normal sensory and motor function  ECG atrial fibrillation at 89 Levels-/08/35 Occasional ventricular pacing  Device function is normal. Programming changes none  See Paceart for details    ASSESSMENT & PLAN:    Sinus node dysfunction   Atrial fibrillation-persistent   Coronary artery disease with prior DES stenting   Pacemaker-St. Jude        Atrial impedance-low  replacement  Hyperlipidemia  Hypertension  Persistent atrial fibrillation.  Perhaps some symptoms.  However not withstanding, EAST AF-Net strongly supports pursuing rhythm control not withstanding the paucity of symptoms.  Given his young age, I have encouraged him to consider this.  We discussed rhythm strategies including dofetilide requiring hospitalization, or amiodarone, but both as a bridge to ablation.  He is quite reluctant to consider ablation and hence, dofetilide might be the preferred option.  He is not sure what to think.  Will plan to have him discuss things with his wife we will call him in a couple of weeks.  We need to update his echocardiogram looking for left ventricular function and atrial size.     [facility] [address] [place] [address] [phone] (office) [phone] (fax)

## 2024-01-09 LAB — CUP PACEART INCLINIC DEVICE CHECK
Date Time Interrogation Session: 20250113161756
Implantable Lead Connection Status: 753985
Implantable Lead Connection Status: 753985
Implantable Lead Implant Date: 20080909
Implantable Lead Implant Date: 20170629
Implantable Lead Location: 753859
Implantable Lead Location: 753860
Implantable Pulse Generator Implant Date: 20170629
Pulse Gen Model: 2272
Pulse Gen Serial Number: 7916025

## 2024-01-12 ENCOUNTER — Encounter: Payer: Self-pay | Admitting: Internal Medicine

## 2024-01-14 ENCOUNTER — Ambulatory Visit (HOSPITAL_COMMUNITY): Payer: BC Managed Care – PPO | Attending: Cardiology

## 2024-01-14 DIAGNOSIS — I4819 Other persistent atrial fibrillation: Secondary | ICD-10-CM | POA: Insufficient documentation

## 2024-01-14 LAB — ECHOCARDIOGRAM COMPLETE
AR max vel: 3.31 cm2
AV Area VTI: 3.39 cm2
AV Area mean vel: 3.13 cm2
AV Mean grad: 2.7 mm[Hg]
AV Peak grad: 5 mm[Hg]
Ao pk vel: 1.11 m/s
S' Lateral: 3.4 cm

## 2024-01-18 ENCOUNTER — Other Ambulatory Visit: Payer: Self-pay | Admitting: Internal Medicine

## 2024-01-18 ENCOUNTER — Encounter: Payer: Self-pay | Admitting: Internal Medicine

## 2024-01-19 ENCOUNTER — Other Ambulatory Visit: Payer: Self-pay

## 2024-01-24 ENCOUNTER — Other Ambulatory Visit: Payer: Self-pay

## 2024-01-24 MED ORDER — LOSARTAN POTASSIUM 25 MG PO TABS: 25.0000 mg | ORAL_TABLET | Freq: Every day | ORAL | 3 refills | Status: DC

## 2024-01-26 ENCOUNTER — Telehealth: Payer: Self-pay

## 2024-01-26 NOTE — Telephone Encounter (Signed)
Attempted phone call to pt and left voicemail message to contact RN at [phone].

## 2024-01-26 NOTE — Telephone Encounter (Signed)
-----   Message from Sherryl Manges sent at 01/24/2024 11:56 AM EST -----  Please Inform Patient Echo showed   overall LV heart muscle function is better and now normal;  the report does describe that the right atrium is enlarged with some symptoms of elevated pulmo pressures and would suggest we get a RHC to evaluate     Thanks

## 2024-01-27 NOTE — Addendum Note (Signed)
Addended by: Geralyn Flash D on: 01/27/2024 03:45 PM   Modules accepted: Orders

## 2024-01-27 NOTE — Progress Notes (Signed)
Remote pacemaker transmission.

## 2024-02-02 ENCOUNTER — Ambulatory Visit: Payer: BC Managed Care – PPO | Admitting: Internal Medicine

## 2024-02-02 ENCOUNTER — Encounter: Payer: Self-pay | Admitting: Internal Medicine

## 2024-02-02 ENCOUNTER — Telehealth: Payer: Self-pay

## 2024-02-02 DIAGNOSIS — I4819 Other persistent atrial fibrillation: Secondary | ICD-10-CM | POA: Diagnosis not present

## 2024-02-02 NOTE — Telephone Encounter (Signed)
 ..  Pt understands that although there may be some limitations with this type of visit, we will take all precautions to reduce any security or privacy concerns.  Pt understands that this will be treated like an in office visit and we will file with pt's insurance, and there may be a patient responsible charge related to this service. ? ?

## 2024-02-02 NOTE — Progress Notes (Signed)
 Electrophysiology TeleHealth Note      Date:  02/02/2024   ID:  [name], DOB 1966-09-30, MRN [id number]  Location: patient's home  Provider location: [address] [place]  Evaluation Performed: Follow-up visit  PCP:  Corwin Levins, MD  Cardiologist:    Electrophysiologist:  SK   Chief Complaint:  atrial fib and elevated Pulm Pressure  History of Present Illness:   My location is [facility]. The Patients location is their work. [name] is a 58 y.o. male who presents via audio conferencing for a telehealth visit today.  Since last being seen in our clinic for persistent atrial fibrillation with discussions to be had regarding rhythm control strategies which she wanted to discuss with his wife, the patient reports not remembering if he talked with his wife or not but he thinks that she wants to know more about this.  Echo showed normal LV function Mod-severe TR>>RAE severely dilated with elevated pulm Pressures>> RHC recommended   The patient denies symptoms of fevers, chills, cough, or new SOB worrisome for COVID 19.   Past Medical History:  Diagnosis Date   Atrial fibrillation [facility])    Atrial lead impedance-intermittently low 05/30/2012   CAD, NATIVE VESSEL 09/11/2009   DISC DISEASE, LUMBAR    GERD (gastroesophageal reflux disease)    Glucose intolerance (impaired glucose tolerance)    H/O: Bell's palsy    Hiatal hernia    Hyperlipidemia    HYPERSOMNIA, ASSOCIATED WITH SLEEP APNEA    Hypertension    LIBIDO, DECREASED    Lumbar disc disease    Myocardial infarction (HCC) 2010   Pacemaker-st judes 2012   PULMONARY NODULE 05/03/2008   Sinoatrial node dysfunction (HCC)    Syncope     Past Surgical History:  Procedure Laterality Date   BACK SURGERY     CARDIAC PACEMAKER PLACEMENT  2008   St Jude Zephyr 5826   CORONARY ANGIOPLASTY WITH STENT PLACEMENT  2010   EP IMPLANTABLE DEVICE N/A 06/11/2016   Procedure: INSERTION of new  right atrial lead and generator device ;  Surgeon: Marinus Maw, MD;  Location: [facility] OR;  Service: Cardiovascular;  Laterality: N/A;   LUMBAR LAMINECTOMY  1990s   PACEMAKER LEAD REMOVAL  06/11/2016   INSERTION of new right atrial lead and generator device  (   PACEMAKER LEAD REMOVAL N/A 06/11/2016   Procedure: PACEMAKER LEAD REMOVAL ;  Surgeon: Marinus Maw, MD;  Location: [facility] OR;  Service: Cardiovascular;  Laterality: N/A;   TEE WITHOUT CARDIOVERSION N/A 06/11/2016   Procedure: TRANSESOPHAGEAL ECHOCARDIOGRAM (TEE);  Surgeon: Marinus Maw, MD;  Location: [facility] OR;  Service: Cardiovascular;  Laterality: N/A;    Current Outpatient Medications  Medication Sig Dispense Refill   apixaban (ELIQUIS) 5 MG TABS tablet Take 1 tablet (5 mg total) by mouth 2 (two) times daily. 180 tablet 3   Boswellia-Glucosamine-Vit D (OSTEO BI-FLEX ONE PER DAY PO) Take by mouth daily.     Cyanocobalamin (B-12 PO) Take by mouth daily.     losartan (COZAAR) 25 MG tablet Take 1 tablet (25 mg total) by mouth daily. 90 tablet 3   metoprolol tartrate (LOPRESSOR) 50 MG tablet Take 1 tablet (50 mg total) by mouth 2 (two) times daily. Please call office to schedule an appt for further refills. Thank you 180 tablet 3   nitroGLYCERIN (NITROSTAT) 0.4 MG SL tablet Place 0.4 mg under the tongue every 5 (five) minutes as needed  for chest pain (MAX 3 TABLETS). Reported on 07/02/2016     rosuvastatin (CRESTOR) 40 MG tablet TAKE 1 TABLET BY MOUTH ONCE DAILY . APPOINTMENT REQUIRED FOR FUTURE REFILLS 90 tablet 0   VITAMIN D, CHOLECALCIFEROL, PO Take by mouth daily.     No current facility-administered medications for this visit.    Allergies:   Lisinopril   ROS:  Please see the history of present illness.   All other systems are personally reviewed and negative.    Exam:    Vital Signs:  There were no vitals taken for this visit.  QRSd did did did the heart rate slow we able to show not really enough to convince herself that you got  that you got go to the bed he did not not sufficiently enough to me know that you got any vagal hyper vagotonia heart and yeah yeah okay the so I think either next DEXA will be either a GXT I think an echo yes and decreasing that you need to look at her work structureless and then but then from a GXT to see if anything happens to it you could set it up when you schedule you to schedule have him schedule at for me okay I sent that just yeah and care Katie and I do all the time okay because we do sort of running takes only 5 minutes of going really fast yes okay   Labs/Other Tests and Data Reviewed:    Recent Labs: 12/16/2023: ALT 17; BUN 19; Creatinine, Ser 1.11; Hemoglobin 16.8; Platelets 207.0; Potassium 5.0; Sodium 142; TSH 1.00   Wt Readings from Last 3 Encounters:  12/27/23 236 lb (107 kg)  12/16/23 233 lb 12.8 oz (106.1 kg)  07/30/23 229 lb 9.6 oz (104.1 kg)     Other studies personally reviewed: Additional studies/ records that were reviewed today include:  (As above)  Review of the above records today demonstrates:  Prior radiographs:     ASSESSMENT & PLAN:    Sinus node dysfunction   Atrial fibrillation-persistent  Pulm HTN with Severe RAE and TR mod-severe   Coronary artery disease with prior DES stenting   Pacemaker-St. Jude        Atrial impedance-low  replacement   Hyperlipidemia   Hypertension  Persistent atrial fibrillation.  Continue to plan.  He thinks the best thing is for him and his wife to come in together.  Will arrange.    Follow-up: An office visit with him and his wife to discuss the atrial fib and the right heart cath     Current medicines are reviewed at length with the patient today.   The patient no concerns regarding his medicines.  The following changes were made today:  none  Labs/ tests ordered today include: none No orders of the defined types were placed in this encounter.     Today, I have spent 9 minutes with the patient with  telehealth technology discussing the above.  Signed, Sherryl Manges, MD  02/02/2024 12:23 PM     [facility] [address] [place] [address] [phone] (office) [phone] (fax)

## 2024-03-01 ENCOUNTER — Encounter: Payer: Self-pay | Admitting: Internal Medicine

## 2024-03-01 ENCOUNTER — Ambulatory Visit: Payer: BC Managed Care – PPO | Attending: Internal Medicine | Admitting: Internal Medicine

## 2024-03-01 VITALS — BP 116/82 | HR 60 | Ht 72.0 in | Wt 231.6 lb

## 2024-03-01 DIAGNOSIS — I495 Sick sinus syndrome: Secondary | ICD-10-CM | POA: Diagnosis not present

## 2024-03-01 DIAGNOSIS — I4819 Other persistent atrial fibrillation: Secondary | ICD-10-CM

## 2024-03-01 DIAGNOSIS — Z950 Presence of cardiac pacemaker: Secondary | ICD-10-CM | POA: Diagnosis not present

## 2024-03-01 NOTE — Progress Notes (Signed)
 Electrophysiology Office Note  Date:  03/01/2024   ID:  [name], DOB [birth year], MRN [id number]  Location: patient's home  Provider location: [address], [place]  Evaluation Performed: Follow-up visit  PCP:  Corwin Levins, MD  Cardiologist:   [facility] Electrophysiologist:  SK   Chief Complaint:  Sinus node dysfunction and atrial fib  History of Present Illness:    [name] is a 58 y.o. male seen in follow-up for remote pacemaker generator implantation St Jude with generator replacement and new atrial lead 5/17 after Atrial lead extraction (GT)  Atrial fibrillation-anticoagulation with Apixaban no bleeding  Coronary artery disease.  IMI 2010.  Underwent PCI of his RCA with 2 overlapping stents.    Persistent atrial fibrillation.  We discussed at his last visit strategies to try to restore sinus rhythm including drug therapy and/or catheter ablation.  He had wanted to discuss it with his wife and so when I called him number of weeks ago, this was that purpose.  He had not discussed it with his wife.  They are here together  The patient denies chest pain, shortness of breath, nocturnal dyspnea, orthopnea or peripheral edema.  There have been no palpitations, lightheadedness or syncope.     Date Cr K LDL HDL Hgb  7/19 0.85  65 36      15.7  10/20  0.97 4.3 60 38.80 16.2  10/21 0.95 4.0 63 34.90 15.2  12/22 0.65 4.4 67  15.6  1/25 1.11 5.0 37  16.8    DATE TEST EF   6/17 TEE 45-50%   1/25 Echo  55-60% RAE severe LAE mod PA sys 43mm    Thromboembolic risk factors (, HTN-1, Vasc disease -1) for a CHADSVASc Score of 2   Past Medical History:  Diagnosis Date   Atrial fibrillation (HCC)    Atrial lead impedance-intermittently low 05/30/2012   CAD, NATIVE VESSEL 09/11/2009   DISC DISEASE, LUMBAR    GERD (gastroesophageal reflux disease)    Glucose intolerance (impaired glucose tolerance)    H/O: Bell's palsy    Hiatal hernia    Hyperlipidemia     HYPERSOMNIA, ASSOCIATED WITH SLEEP APNEA    Hypertension    LIBIDO, DECREASED    Lumbar disc disease    Myocardial infarction (HCC) 2010   Pacemaker-st judes 2012   PULMONARY NODULE 05/03/2008   Sinoatrial node dysfunction (HCC)    Syncope     Past Surgical History:  Procedure Laterality Date   BACK SURGERY     CARDIAC PACEMAKER PLACEMENT  2008   St Jude Zephyr 5826   CORONARY ANGIOPLASTY WITH STENT PLACEMENT  2010   EP IMPLANTABLE DEVICE N/A 06/11/2016   Procedure: INSERTION of new right atrial lead and generator device ;  Surgeon: Marinus Maw, MD;  Location: [facility] OR;  Service: Cardiovascular;  Laterality: N/A;   LUMBAR LAMINECTOMY  1990s   PACEMAKER LEAD REMOVAL  06/11/2016   INSERTION of new right atrial lead and generator device  (   PACEMAKER LEAD REMOVAL N/A 06/11/2016   Procedure: PACEMAKER LEAD REMOVAL ;  Surgeon: Marinus Maw, MD;  Location: [facility] OR;  Service: Cardiovascular;  Laterality: N/A;   TEE WITHOUT CARDIOVERSION N/A 06/11/2016   Procedure: TRANSESOPHAGEAL ECHOCARDIOGRAM (TEE);  Surgeon: Marinus Maw, MD;  Location: [facility] OR;  Service: Cardiovascular;  Laterality: N/A;    Current Outpatient Medications  Medication Sig Dispense Refill   apixaban (ELIQUIS) 5 MG TABS tablet  Take 1 tablet (5 mg total) by mouth 2 (two) times daily. 180 tablet 3   Boswellia-Glucosamine-Vit D (OSTEO BI-FLEX ONE PER DAY PO) Take by mouth daily.     Cyanocobalamin (B-12 PO) Take by mouth daily.     losartan (COZAAR) 25 MG tablet Take 1 tablet (25 mg total) by mouth daily. 90 tablet 3   metoprolol tartrate (LOPRESSOR) 50 MG tablet Take 1 tablet (50 mg total) by mouth 2 (two) times daily. Please call office to schedule an appt for further refills. Thank you 180 tablet 3   nitroGLYCERIN (NITROSTAT) 0.4 MG SL tablet Place 0.4 mg under the tongue every 5 (five) minutes as needed for chest pain (MAX 3 TABLETS). Reported on 07/02/2016     rosuvastatin (CRESTOR) 40 MG tablet TAKE 1 TABLET BY MOUTH ONCE  DAILY . APPOINTMENT REQUIRED FOR FUTURE REFILLS 90 tablet 0   VITAMIN D, CHOLECALCIFEROL, PO Take by mouth daily.     No current facility-administered medications for this visit.    Allergies:   Lisinopril      Exam:    Vital Signs:  BP 116/82   Pulse 60   Ht 6' (1.829 m)   Wt 231 lb 9.6 oz (105.1 kg)   SpO2 98%   BMI 31.41 kg/m    Well developed and well nourished in no acute distress HENT normal Neck supple with JVP-flat Clear Device pocket well healed; without hematoma or erythema.  There is no tethering  Regular rate and rhythm, no  gallop No  murmur Abd-soft with active BS No Clubbing cyanosis  edema Skin-warm and dry A & Oriented  Grossly normal sensory and motor function  ECG atrial fib with occ Vpacing -/08/38.   Device function is normal. Programming changes none   See Paceart for details      ASSESSMENT & PLAN:    Sinus node dysfunction   Atrial fibrillation-persistent   Coronary artery disease with prior DES stenting   Pacemaker-St. Jude        Atrial impedance-low  replacement  Hyperlipidemia  Hypertension  Persistent atrial fibrillation.  Again reviewed with him and his wife the alternatives of rhythm control versus rate control and my strong recommendation that they pursue the former either in the form of a drug or has catheter ablation.  He remains reluctant. We also discussed watchman as an alternative to anticoagulation  Blood pressure remains controlled on losartan.  \No bleeding continue Eliquis 5  We will discuss it together as a family, if they decide to pursue procedural strategies, we will set that up with Dr. Jimmey Ralph earlier than 1 year follow-up.    [facility] [address] [place] [address] [phone] (office) [phone] (fax)

## 2024-03-01 NOTE — Patient Instructions (Signed)
Medication Instructions:  Your physician recommends that you continue on your current medications as directed. Please refer to the Current Medication list given to you today.  *If you need a refill on your cardiac medications before your next appointment, please call your pharmacy*   Lab Work: None ordered.  If you have labs (blood work) drawn today and your tests are completely normal, you will receive your results only by: MyChart Message (if you have MyChart) OR A paper copy in the mail If you have any lab test that is abnormal or we need to change your treatment, we will call you to review the results.   Testing/Procedures: None ordered.    Follow-Up: At [facility], you and your health needs are our priority.  As part of our continuing mission to provide you with exceptional heart care, we have created designated Provider Care Teams.  These Care Teams include your primary Cardiologist (physician) and Advanced Practice Providers (APPs -  Physician Assistants and Nurse Practitioners) who all work together to provide you with the care you need, when you need it.  We recommend signing up for the patient portal called "MyChart".  Sign up information is provided on this After Visit Summary.  MyChart is used to connect with patients for Virtual Visits (Telemedicine).  Patients are able to view lab/test results, encounter notes, upcoming appointments, etc.  Non-urgent messages can be sent to your provider as well.   To learn more about what you can do with MyChart, go to [email or web address].    Your next appointment:   12 months with Dr Michele Rockers

## 2024-03-12 LAB — CUP PACEART INCLINIC DEVICE CHECK
Date Time Interrogation Session: 20250319134427
Implantable Lead Connection Status: 753985
Implantable Lead Connection Status: 753985
Implantable Lead Implant Date: 20080909
Implantable Lead Implant Date: 20170629
Implantable Lead Location: 753859
Implantable Lead Location: 753860
Implantable Pulse Generator Implant Date: 20170629
Pulse Gen Model: 2272
Pulse Gen Serial Number: 7916025

## 2024-03-16 ENCOUNTER — Other Ambulatory Visit: Payer: Self-pay | Admitting: Internal Medicine

## 2024-03-16 ENCOUNTER — Other Ambulatory Visit: Payer: Self-pay

## 2024-03-20 ENCOUNTER — Ambulatory Visit (INDEPENDENT_AMBULATORY_CARE_PROVIDER_SITE_OTHER): Payer: BC Managed Care – PPO

## 2024-03-20 DIAGNOSIS — I4819 Other persistent atrial fibrillation: Secondary | ICD-10-CM

## 2024-03-20 DIAGNOSIS — I495 Sick sinus syndrome: Secondary | ICD-10-CM

## 2024-03-21 LAB — CUP PACEART REMOTE DEVICE CHECK
Battery Remaining Longevity: 38 mo
Battery Remaining Percentage: 32 %
Battery Voltage: 2.95 V
Brady Statistic AP VP Percent: 0 %
Brady Statistic AP VS Percent: 0 %
Brady Statistic AS VP Percent: 0 %
Brady Statistic AS VS Percent: 0 %
Brady Statistic RA Percent Paced: 1 %
Brady Statistic RV Percent Paced: 32 %
Date Time Interrogation Session: 20250407020013
Implantable Lead Connection Status: 753985
Implantable Lead Connection Status: 753985
Implantable Lead Implant Date: 20080909
Implantable Lead Implant Date: 20170629
Implantable Lead Location: 753859
Implantable Lead Location: 753860
Implantable Pulse Generator Implant Date: 20170629
Lead Channel Impedance Value: 350 Ohm
Lead Channel Impedance Value: 440 Ohm
Lead Channel Pacing Threshold Amplitude: 0.5 V
Lead Channel Pacing Threshold Amplitude: 1 V
Lead Channel Pacing Threshold Pulse Width: 0.4 ms
Lead Channel Pacing Threshold Pulse Width: 0.4 ms
Lead Channel Sensing Intrinsic Amplitude: 1 mV
Lead Channel Sensing Intrinsic Amplitude: 10.8 mV
Lead Channel Setting Pacing Amplitude: 2 V
Lead Channel Setting Pacing Amplitude: 2.5 V
Lead Channel Setting Pacing Pulse Width: 0.4 ms
Lead Channel Setting Sensing Sensitivity: 2 mV
Pulse Gen Model: 2272
Pulse Gen Serial Number: 7916025

## 2024-03-22 NOTE — Telephone Encounter (Signed)
 He has significant R sided enlargement based on Jan echo And the RHC might give [place] some insight into this and what might be able to do to prevent it from becoming problematic over the years to come [facility] this helps SK

## 2024-03-25 ENCOUNTER — Encounter: Payer: Self-pay | Admitting: Internal Medicine

## 2024-05-02 NOTE — Addendum Note (Signed)
 Addended by: Edra Govern D on: 05/02/2024 04:39 PM   Modules accepted: Orders

## 2024-05-02 NOTE — Progress Notes (Signed)
 Remote pacemaker transmission.

## 2024-06-19 ENCOUNTER — Ambulatory Visit (INDEPENDENT_AMBULATORY_CARE_PROVIDER_SITE_OTHER): Payer: BC Managed Care – PPO

## 2024-06-19 DIAGNOSIS — I4819 Other persistent atrial fibrillation: Secondary | ICD-10-CM | POA: Diagnosis not present

## 2024-06-20 LAB — CUP PACEART REMOTE DEVICE CHECK
Battery Remaining Longevity: 34 mo
Battery Remaining Percentage: 30 %
Battery Voltage: 2.95 V
Brady Statistic AP VP Percent: 0 %
Brady Statistic AP VS Percent: 0 %
Brady Statistic AS VP Percent: 0 %
Brady Statistic AS VS Percent: 0 %
Brady Statistic RA Percent Paced: 1 %
Brady Statistic RV Percent Paced: 26 %
Date Time Interrogation Session: 20250707020013
Implantable Lead Connection Status: 753985
Implantable Lead Connection Status: 753985
Implantable Lead Implant Date: 20080909
Implantable Lead Implant Date: 20170629
Implantable Lead Location: 753859
Implantable Lead Location: 753860
Implantable Pulse Generator Implant Date: 20170629
Lead Channel Impedance Value: 350 Ohm
Lead Channel Impedance Value: 430 Ohm
Lead Channel Pacing Threshold Amplitude: 0.5 V
Lead Channel Pacing Threshold Amplitude: 1 V
Lead Channel Pacing Threshold Pulse Width: 0.4 ms
Lead Channel Pacing Threshold Pulse Width: 0.4 ms
Lead Channel Sensing Intrinsic Amplitude: 1 mV
Lead Channel Sensing Intrinsic Amplitude: 11 mV
Lead Channel Setting Pacing Amplitude: 2 V
Lead Channel Setting Pacing Amplitude: 2.5 V
Lead Channel Setting Pacing Pulse Width: 0.4 ms
Lead Channel Setting Sensing Sensitivity: 2 mV
Pulse Gen Model: 2272
Pulse Gen Serial Number: 7916025

## 2024-07-15 ENCOUNTER — Ambulatory Visit: Payer: Self-pay | Admitting: Cardiology

## 2024-07-15 ENCOUNTER — Other Ambulatory Visit: Payer: Self-pay | Admitting: Internal Medicine

## 2024-09-18 ENCOUNTER — Ambulatory Visit: Payer: BC Managed Care – PPO

## 2024-09-18 DIAGNOSIS — I4819 Other persistent atrial fibrillation: Secondary | ICD-10-CM | POA: Diagnosis not present

## 2024-09-20 LAB — CUP PACEART REMOTE DEVICE CHECK
Battery Remaining Longevity: 32 mo
Battery Remaining Percentage: 27 %
Battery Voltage: 2.93 V
Brady Statistic AP VP Percent: 0 %
Brady Statistic AP VS Percent: 0 %
Brady Statistic AS VP Percent: 0 %
Brady Statistic AS VS Percent: 0 %
Brady Statistic RA Percent Paced: 1 %
Brady Statistic RV Percent Paced: 23 %
Date Time Interrogation Session: 20251006020014
Implantable Lead Connection Status: 753985
Implantable Lead Connection Status: 753985
Implantable Lead Implant Date: 20080909
Implantable Lead Implant Date: 20170629
Implantable Lead Location: 753859
Implantable Lead Location: 753860
Implantable Pulse Generator Implant Date: 20170629
Lead Channel Impedance Value: 350 Ohm
Lead Channel Impedance Value: 440 Ohm
Lead Channel Pacing Threshold Amplitude: 0.5 V
Lead Channel Pacing Threshold Amplitude: 1 V
Lead Channel Pacing Threshold Pulse Width: 0.4 ms
Lead Channel Pacing Threshold Pulse Width: 0.4 ms
Lead Channel Sensing Intrinsic Amplitude: 1 mV
Lead Channel Sensing Intrinsic Amplitude: 11 mV
Lead Channel Setting Pacing Amplitude: 2 V
Lead Channel Setting Pacing Amplitude: 2.5 V
Lead Channel Setting Pacing Pulse Width: 0.4 ms
Lead Channel Setting Sensing Sensitivity: 2 mV
Pulse Gen Model: 2272
Pulse Gen Serial Number: 7916025

## 2024-09-20 NOTE — Progress Notes (Signed)
 Remote PPM Transmission

## 2024-09-22 ENCOUNTER — Ambulatory Visit: Payer: Self-pay | Admitting: Cardiology

## 2024-09-25 NOTE — Progress Notes (Signed)
 Remote PPM Transmission

## 2024-10-10 ENCOUNTER — Other Ambulatory Visit: Payer: Self-pay | Admitting: Internal Medicine

## 2024-10-10 ENCOUNTER — Other Ambulatory Visit: Payer: Self-pay

## 2024-12-18 ENCOUNTER — Ambulatory Visit: Payer: BC Managed Care – PPO

## 2024-12-18 DIAGNOSIS — I4819 Other persistent atrial fibrillation: Secondary | ICD-10-CM | POA: Diagnosis not present

## 2024-12-19 ENCOUNTER — Encounter: Payer: Self-pay | Admitting: Internal Medicine

## 2024-12-19 ENCOUNTER — Ambulatory Visit (INDEPENDENT_AMBULATORY_CARE_PROVIDER_SITE_OTHER): Admitting: Internal Medicine

## 2024-12-19 ENCOUNTER — Ambulatory Visit: Payer: Self-pay | Admitting: Internal Medicine

## 2024-12-19 VITALS — BP 122/78 | HR 69 | Temp 98.1°F | Ht 72.0 in | Wt 236.0 lb

## 2024-12-19 DIAGNOSIS — E538 Deficiency of other specified B group vitamins: Secondary | ICD-10-CM | POA: Diagnosis not present

## 2024-12-19 DIAGNOSIS — R739 Hyperglycemia, unspecified: Secondary | ICD-10-CM

## 2024-12-19 DIAGNOSIS — I10 Essential (primary) hypertension: Secondary | ICD-10-CM | POA: Diagnosis not present

## 2024-12-19 DIAGNOSIS — Z0000 Encounter for general adult medical examination without abnormal findings: Secondary | ICD-10-CM | POA: Diagnosis not present

## 2024-12-19 DIAGNOSIS — E782 Mixed hyperlipidemia: Secondary | ICD-10-CM | POA: Diagnosis not present

## 2024-12-19 DIAGNOSIS — Z1211 Encounter for screening for malignant neoplasm of colon: Secondary | ICD-10-CM

## 2024-12-19 DIAGNOSIS — Z0001 Encounter for general adult medical examination with abnormal findings: Secondary | ICD-10-CM

## 2024-12-19 DIAGNOSIS — Z125 Encounter for screening for malignant neoplasm of prostate: Secondary | ICD-10-CM | POA: Diagnosis not present

## 2024-12-19 DIAGNOSIS — E559 Vitamin D deficiency, unspecified: Secondary | ICD-10-CM

## 2024-12-19 LAB — PSA: PSA: 0.63 ng/mL (ref 0.10–4.00)

## 2024-12-19 LAB — CUP PACEART REMOTE DEVICE CHECK
Battery Remaining Longevity: 28 mo
Battery Remaining Percentage: 24 %
Battery Voltage: 2.92 V
Brady Statistic AP VP Percent: 0 %
Brady Statistic AP VS Percent: 0 %
Brady Statistic AS VP Percent: 0 %
Brady Statistic AS VS Percent: 0 %
Brady Statistic RA Percent Paced: 1 %
Brady Statistic RV Percent Paced: 22 %
Date Time Interrogation Session: 20260105020018
Implantable Lead Connection Status: 753985
Implantable Lead Connection Status: 753985
Implantable Lead Implant Date: 20080909
Implantable Lead Implant Date: 20170629
Implantable Lead Location: 753859
Implantable Lead Location: 753860
Implantable Pulse Generator Implant Date: 20170629
Lead Channel Impedance Value: 340 Ohm
Lead Channel Impedance Value: 430 Ohm
Lead Channel Pacing Threshold Amplitude: 0.5 V
Lead Channel Pacing Threshold Amplitude: 1 V
Lead Channel Pacing Threshold Pulse Width: 0.4 ms
Lead Channel Pacing Threshold Pulse Width: 0.4 ms
Lead Channel Sensing Intrinsic Amplitude: 1 mV
Lead Channel Sensing Intrinsic Amplitude: 10.9 mV
Lead Channel Setting Pacing Amplitude: 2 V
Lead Channel Setting Pacing Amplitude: 2.5 V
Lead Channel Setting Pacing Pulse Width: 0.4 ms
Lead Channel Setting Sensing Sensitivity: 2 mV
Pulse Gen Model: 2272
Pulse Gen Serial Number: 7916025

## 2024-12-19 LAB — HEPATIC FUNCTION PANEL
ALT: 18 U/L (ref 3–53)
AST: 18 U/L (ref 5–37)
Albumin: 4.4 g/dL (ref 3.5–5.2)
Alkaline Phosphatase: 39 U/L (ref 39–117)
Bilirubin, Direct: 0.2 mg/dL (ref 0.1–0.3)
Total Bilirubin: 0.9 mg/dL (ref 0.2–1.2)
Total Protein: 6.7 g/dL (ref 6.0–8.3)

## 2024-12-19 LAB — CBC WITH DIFFERENTIAL/PLATELET
Basophils Absolute: 0 K/uL (ref 0.0–0.1)
Basophils Relative: 1 % (ref 0.0–3.0)
Eosinophils Absolute: 0.1 K/uL (ref 0.0–0.7)
Eosinophils Relative: 3 % (ref 0.0–5.0)
HCT: 44.2 % (ref 39.0–52.0)
Hemoglobin: 15.6 g/dL (ref 13.0–17.0)
Lymphocytes Relative: 25.5 % (ref 12.0–46.0)
Lymphs Abs: 1.3 K/uL (ref 0.7–4.0)
MCHC: 35.3 g/dL (ref 30.0–36.0)
MCV: 91.9 fl (ref 78.0–100.0)
Monocytes Absolute: 0.4 K/uL (ref 0.1–1.0)
Monocytes Relative: 7.9 % (ref 3.0–12.0)
Neutro Abs: 3.1 K/uL (ref 1.4–7.7)
Neutrophils Relative %: 62.6 % (ref 43.0–77.0)
Platelets: 198 K/uL (ref 150.0–400.0)
RBC: 4.82 Mil/uL (ref 4.22–5.81)
RDW: 12.9 % (ref 11.5–15.5)
WBC: 4.9 K/uL (ref 4.0–10.5)

## 2024-12-19 LAB — URINALYSIS, ROUTINE W REFLEX MICROSCOPIC
Bilirubin Urine: NEGATIVE
Hgb urine dipstick: NEGATIVE
Ketones, ur: NEGATIVE
Leukocytes,Ua: NEGATIVE
Nitrite: NEGATIVE
Specific Gravity, Urine: 1.025 (ref 1.000–1.030)
Total Protein, Urine: NEGATIVE
Urine Glucose: NEGATIVE
Urobilinogen, UA: 1 (ref 0.0–1.0)
pH: 6 (ref 5.0–8.0)

## 2024-12-19 LAB — VITAMIN B12: Vitamin B-12: 610 pg/mL (ref 211–911)

## 2024-12-19 LAB — LIPID PANEL
Cholesterol: 114 mg/dL (ref 28–200)
HDL: 34.1 mg/dL — ABNORMAL LOW
LDL Cholesterol: 66 mg/dL (ref 10–99)
NonHDL: 80.12
Total CHOL/HDL Ratio: 3
Triglycerides: 69 mg/dL (ref 10.0–149.0)
VLDL: 13.8 mg/dL (ref 0.0–40.0)

## 2024-12-19 LAB — TSH: TSH: 0.9 u[IU]/mL (ref 0.35–5.50)

## 2024-12-19 LAB — BASIC METABOLIC PANEL WITH GFR
BUN: 18 mg/dL (ref 6–23)
CO2: 23 meq/L (ref 19–32)
Calcium: 9.2 mg/dL (ref 8.4–10.5)
Chloride: 110 meq/L (ref 96–112)
Creatinine, Ser: 1 mg/dL (ref 0.40–1.50)
GFR: 81.83 mL/min
Glucose, Bld: 119 mg/dL — ABNORMAL HIGH (ref 70–99)
Potassium: 4.4 meq/L (ref 3.5–5.1)
Sodium: 144 meq/L (ref 135–145)

## 2024-12-19 LAB — VITAMIN D 25 HYDROXY (VIT D DEFICIENCY, FRACTURES): VITD: 37.12 ng/mL (ref 30.00–100.00)

## 2024-12-19 LAB — HEMOGLOBIN A1C: Hgb A1c MFr Bld: 5.7 % (ref 4.6–6.5)

## 2024-12-19 MED ORDER — APIXABAN 5 MG PO TABS: 5.0000 mg | ORAL_TABLET | Freq: Two times a day (BID) | ORAL | 3 refills | Status: AC

## 2024-12-19 MED ORDER — ROSUVASTATIN CALCIUM 40 MG PO TABS: 40.0000 mg | ORAL_TABLET | Freq: Every day | ORAL | 3 refills | Status: AC

## 2024-12-19 MED ORDER — LOSARTAN POTASSIUM 25 MG PO TABS: 25.0000 mg | ORAL_TABLET | Freq: Every day | ORAL | 3 refills | Status: AC

## 2024-12-19 MED ORDER — METOPROLOL TARTRATE 50 MG PO TABS: 50.0000 mg | ORAL_TABLET | Freq: Two times a day (BID) | ORAL | 3 refills | Status: AC

## 2024-12-19 NOTE — Progress Notes (Signed)
 Patient ID: [name], male   DOB: 02-11-1966, 59 y.o.   MRN: [id number]         Chief Complaint:: wellness exam and low vit d and b12, hld, hyperglycemia, htn       HPI:  [name] is a 59 y.o. male here for wellness exam; for cologuard, declines prevnar shingrix and flu shot, o/w up to date                        Also Pt denies chest pain, increased sob or doe, wheezing, orthopnea, PND, increased LE swelling, palpitations, dizziness or syncope.   Pt denies polydipsia, polyuria, or new focal neuro s/s.    Pt denies fever, wt loss, night sweats, loss of appetite, or other constitutional symptoms     Wt Readings from Last 3 Encounters:  12/19/24 236 lb (107 kg)  03/01/24 231 lb 9.6 oz (105.1 kg)  12/27/23 236 lb (107 kg)   BP Readings from Last 3 Encounters:  12/19/24 122/78  03/01/24 116/82  12/27/23 126/81   Immunization History  Administered Date(s) Administered   Pneumococcal Polysaccharide-23 12/09/2010   Td 12/09/2010   Health Maintenance Due  Topic Date Due   Colonoscopy  Never done   Pneumococcal Vaccine: 50+ Years (2 of 2 - PCV) 12/10/2011   Zoster Vaccines- Shingrix (1 of 2) Never done   Influenza Vaccine  Never done      Past Medical History:  Diagnosis Date   Atrial fibrillation (HCC)    Atrial lead impedance-intermittently low 05/30/2012   CAD, NATIVE VESSEL 09/11/2009   DISC DISEASE, LUMBAR    GERD (gastroesophageal reflux disease)    Glucose intolerance (impaired glucose tolerance)    H/O: Bell's palsy    Hiatal hernia    Hyperlipidemia    HYPERSOMNIA, ASSOCIATED WITH SLEEP APNEA    Hypertension    LIBIDO, DECREASED    Lumbar disc disease    Myocardial infarction (HCC) 2010   Pacemaker-st judes 2012   PULMONARY NODULE 05/03/2008   Sinoatrial node dysfunction (HCC)    Syncope    Past Surgical History:  Procedure Laterality Date   BACK SURGERY     CARDIAC PACEMAKER PLACEMENT  2008   St Jude Zephyr 5826   CORONARY ANGIOPLASTY WITH STENT  PLACEMENT  2010   EP IMPLANTABLE DEVICE N/A 06/11/2016   Procedure: INSERTION of new right atrial lead and generator device ;  Surgeon: Danelle LELON Birmingham, MD;  Location: [facility] OR;  Service: Cardiovascular;  Laterality: N/A;   LUMBAR LAMINECTOMY  1990s   PACEMAKER LEAD REMOVAL  06/11/2016   INSERTION of new right atrial lead and generator device  (   PACEMAKER LEAD REMOVAL N/A 06/11/2016   Procedure: PACEMAKER LEAD REMOVAL ;  Surgeon: Danelle LELON Birmingham, MD;  Location: [facility] OR;  Service: Cardiovascular;  Laterality: N/A;   TEE WITHOUT CARDIOVERSION N/A 06/11/2016   Procedure: TRANSESOPHAGEAL ECHOCARDIOGRAM (TEE);  Surgeon: Danelle LELON Birmingham, MD;  Location: [facility] OR;  Service: Cardiovascular;  Laterality: N/A;    reports that he quit smoking about 17 years ago. His smoking use included cigarettes. He started smoking about 42 years ago. He has a 25 pack-year smoking history. He has quit using smokeless tobacco. He reports current alcohol use of about 2.0 standard drinks of alcohol per week. He reports that he does not use drugs. family history includes Coronary artery disease in an other family member; Diabetes in his maternal grandfather, maternal grandmother, and  another family member; Heart attack in his father and paternal grandfather; Hyperlipidemia in an other family member; Hypertension in an other family member. Allergies[1] Medications Ordered Prior to Encounter[2]      ROS:  All others reviewed and negative.  Objective        PE:  BP 122/78 (BP Location: Right Arm, Patient Position: Sitting, Cuff Size: Normal)   Pulse 69   Temp 98.1 F (36.7 C) (Oral)   Ht 6' (1.829 m)   Wt 236 lb (107 kg)   SpO2 97%   BMI 32.01 kg/m                 Constitutional: Pt appears in NAD               HENT: Head: NCAT.                Right Ear: External ear normal.                 Left Ear: External ear normal.                Eyes: . Pupils are equal, round, and reactive to light. Conjunctivae and EOM are normal                Nose: without d/c or deformity               Neck: Neck supple. Gross normal ROM               Cardiovascular: Normal rate and regular rhythm.                 Pulmonary/Chest: Effort normal and breath sounds without rales or wheezing.                Abd:  Soft, NT, ND, + BS, no organomegaly               Neurological: Pt is alert. At baseline orientation, motor grossly intact               Skin: Skin is warm. No rashes, no other new lesions, LE edema - none               Psychiatric: Pt behavior is normal without agitation   Micro: none  Cardiac tracings I have personally interpreted today:  none  Pertinent Radiological findings (summarize): none   Lab Results  Component Value Date   WBC 4.9 12/19/2024   HGB 15.6 12/19/2024   HCT 44.2 12/19/2024   PLT 198.0 12/19/2024   GLUCOSE 119 (H) 12/19/2024   CHOL 114 12/19/2024   TRIG 69.0 12/19/2024   HDL 34.10 (L) 12/19/2024   LDLCALC 66 12/19/2024   ALT 18 12/19/2024   AST 18 12/19/2024   NA 144 12/19/2024   K 4.4 12/19/2024   CL 110 12/19/2024   CREATININE 1.00 12/19/2024   BUN 18 12/19/2024   CO2 23 12/19/2024   TSH 0.90 12/19/2024   PSA 0.63 12/19/2024   INR 1.1 08/25/2009   HGBA1C 5.7 12/19/2024   Assessment/Plan:  [name] is a 59 y.o. White or Caucasian [1] male with  has a past medical history of Atrial fibrillation (HCC), Atrial lead impedance-intermittently low (05/30/2012), CAD, NATIVE VESSEL (09/11/2009), DISC DISEASE, LUMBAR, GERD (gastroesophageal reflux disease), Glucose intolerance (impaired glucose tolerance), H/O: Bell's palsy, Hiatal hernia, Hyperlipidemia, HYPERSOMNIA, ASSOCIATED WITH SLEEP APNEA, Hypertension, LIBIDO, DECREASED, Lumbar disc disease, Myocardial infarction (HCC) (2010), Pacemaker-st judes (2012), PULMONARY NODULE (05/03/2008),  Sinoatrial node dysfunction (HCC), and Syncope.  Encounter for well adult exam with abnormal findings Age and sex appropriate education and counseling updated with  regular exercise and diet Referrals for preventative services - for cologuard Immunizations addressed - declines all Smoking counseling  - none needed Evidence for depression or other mood disorder - none significant Most recent labs reviewed. I have personally reviewed and have noted: 1) the patient's medical and social history 2) The patient's current medications and supplements 3) The patient's height, weight, and BMI have been recorded in the chart   Vitamin D  deficiency Last vitamin D  Lab Results  Component Value Date   VD25OH 37.12 12/19/2024   Low, to start oral replacement   Vitamin B 12 deficiency Lab Results  Component Value Date   VITAMINB12 610 12/19/2024   Stable, cont oral replacement - b12 1000 mcg qd   Hyperlipemia Lab Results  Component Value Date   LDLCALC 66 12/19/2024   Stable, pt to continue current statin crestor  40 mg qd   Hyperglycemia Lab Results  Component Value Date   HGBA1C 5.7 12/19/2024   Stable, pt to continue current medical treatment  - diet, wt control   Essential hypertension BP Readings from Last 3 Encounters:  12/19/24 122/78  03/01/24 116/82  12/27/23 126/81   Stable, pt to continue medical treatment losartan  25 mg every day, lopressor  50 mg bid  Followup: Return in about 1 year (around 12/19/2025).  Lynwood Rush, MD 12/19/2024 8:20 PM [facility]     [1]  Allergies Allergen Reactions   Lisinopril     REACTION: cough  [2]  Current Outpatient Medications on File Prior to Visit  Medication Sig Dispense Refill   Boswellia-Glucosamine-Vit D (OSTEO BI-FLEX ONE PER DAY PO) Take by mouth daily.     Cyanocobalamin  (B-12 PO) Take by mouth daily.     nitroGLYCERIN  (NITROSTAT ) 0.4 MG SL tablet Place 0.4 mg under the tongue every 5 (five) minutes as needed for chest pain (MAX 3 TABLETS). Reported on 07/02/2016     VITAMIN D , CHOLECALCIFEROL, PO Take by mouth  daily.     No current facility-administered medications on file prior to visit.

## 2024-12-19 NOTE — Assessment & Plan Note (Signed)
 Lab Results  Component Value Date   VITAMINB12 610 12/19/2024   Stable, cont oral replacement - b12 1000 mcg qd

## 2024-12-19 NOTE — Assessment & Plan Note (Signed)
 Lab Results  Component Value Date   LDLCALC 66 12/19/2024   Stable, pt to continue current statin crestor  40 mg qd

## 2024-12-19 NOTE — Patient Instructions (Signed)
 Please continue all other medications as before, and refills have been done if requested.  Please have the pharmacy call with any other refills you may need.  Please continue your efforts at being more active, low cholesterol diet, and weight control.  You are otherwise up to date with prevention measures today.  Please keep your appointments with your specialists as you may have planned  You will be contacted regarding the referral for: Cologuard  Please go to the LAB at the blood drawing area for the tests to be done  You will be contacted by phone if any changes need to be made immediately.  Otherwise, you will receive a letter about your results with an explanation, but please check with MyChart first.  Please make an Appointment to return for your 1 year visit, or sooner if needed

## 2024-12-19 NOTE — Assessment & Plan Note (Signed)
 Lab Results  Component Value Date   HGBA1C 5.7 12/19/2024   Stable, pt to continue current medical treatment  - diet, wt control

## 2024-12-19 NOTE — Progress Notes (Signed)
 The test results show that your current treatment is OK, as the tests are stable.  Please continue the same plan.  There is no other need for change of treatment or further evaluation based on these results, at this time.  thanks

## 2024-12-19 NOTE — Assessment & Plan Note (Signed)
 Age and sex appropriate education and counseling updated with regular exercise and diet Referrals for preventative services - for cologuard Immunizations addressed - declines all Smoking counseling  - none needed Evidence for depression or other mood disorder - none significant Most recent labs reviewed. I have personally reviewed and have noted: 1) the patient's medical and social history 2) The patient's current medications and supplements 3) The patient's height, weight, and BMI have been recorded in the chart

## 2024-12-19 NOTE — Assessment & Plan Note (Signed)
 BP Readings from Last 3 Encounters:  12/19/24 122/78  03/01/24 116/82  12/27/23 126/81   Stable, pt to continue medical treatment losartan  25 mg every day, lopressor  50 mg bid

## 2024-12-19 NOTE — Assessment & Plan Note (Signed)
 Last vitamin D  Lab Results  Component Value Date   VD25OH 37.12 12/19/2024   Low, to start oral replacement

## 2024-12-20 ENCOUNTER — Ambulatory Visit: Payer: Self-pay | Admitting: Cardiology

## 2024-12-21 NOTE — Progress Notes (Signed)
 Remote PPM Transmission

## 2025-01-05 ENCOUNTER — Other Ambulatory Visit: Payer: Self-pay

## 2025-01-12 ENCOUNTER — Other Ambulatory Visit: Payer: Self-pay | Admitting: Cardiovascular Disease

## 2025-01-19 ENCOUNTER — Telehealth: Payer: Self-pay

## 2025-01-19 NOTE — Telephone Encounter (Signed)
 Copied from CRM [id number]. Topic: Appointments - Scheduling Inquiry for Clinic >> Jan 19, 2025  1:39 PM China J wrote: Reason for CRM: he patinet would like to know if Dr. Geofm would be willing to accept his as a new patient since his wife Conan Mcmanaway) is established with her.  Please call the patient's wife at: [phone]

## 2025-05-22 ENCOUNTER — Ambulatory Visit: Admitting: Cardiology
